# Patient Record
Sex: Female | Born: 1935 | Race: White | Hispanic: No | Smoking: Never smoker
Health system: Southern US, Community
[De-identification: ages and names within clinical notes are randomized; demographics above are authoritative.]

## PROBLEM LIST (undated history)

## (undated) DIAGNOSIS — I10 Essential (primary) hypertension: Secondary | ICD-10-CM

## (undated) DIAGNOSIS — I482 Chronic atrial fibrillation, unspecified: Secondary | ICD-10-CM

## (undated) DIAGNOSIS — D693 Immune thrombocytopenic purpura: Secondary | ICD-10-CM

## (undated) DIAGNOSIS — S329XXA Fracture of unspecified parts of lumbosacral spine and pelvis, initial encounter for closed fracture: Secondary | ICD-10-CM

## (undated) DIAGNOSIS — E079 Disorder of thyroid, unspecified: Secondary | ICD-10-CM

## (undated) DIAGNOSIS — E039 Hypothyroidism, unspecified: Secondary | ICD-10-CM

## (undated) DIAGNOSIS — R609 Edema, unspecified: Secondary | ICD-10-CM

## (undated) HISTORY — PX: BUNIONECTOMY: SHX129

## (undated) HISTORY — DX: Immune thrombocytopenic purpura: D69.3

## (undated) HISTORY — PX: DOPPLER ECHOCARDIOGRAPHY: SHX263

## (undated) HISTORY — DX: Edema, unspecified: R60.9

## (undated) HISTORY — DX: Essential (primary) hypertension: I10

## (undated) HISTORY — DX: Fracture of unspecified parts of lumbosacral spine and pelvis, initial encounter for closed fracture: S32.9XXA

## (undated) HISTORY — PX: ABDOMINAL HYSTERECTOMY: SHX81

## (undated) HISTORY — PX: OTHER SURGICAL HISTORY: SHX169

## (undated) HISTORY — DX: Disorder of thyroid, unspecified: E07.9

## (undated) HISTORY — DX: Chronic atrial fibrillation, unspecified: I48.20

---

## 1998-09-01 ENCOUNTER — Other Ambulatory Visit: Admission: RE | Admit: 1998-09-01 | Discharge: 1998-09-01 | Payer: Self-pay | Admitting: Gynecology

## 1999-10-17 ENCOUNTER — Other Ambulatory Visit: Admission: RE | Admit: 1999-10-17 | Discharge: 1999-10-17 | Payer: Self-pay | Admitting: Gynecology

## 1999-10-20 ENCOUNTER — Encounter: Admission: RE | Admit: 1999-10-20 | Discharge: 1999-10-20 | Payer: Self-pay | Admitting: Gynecology

## 1999-10-20 ENCOUNTER — Encounter: Payer: Self-pay | Admitting: Gynecology

## 1999-10-24 ENCOUNTER — Encounter: Payer: Self-pay | Admitting: Gynecology

## 1999-10-24 ENCOUNTER — Encounter: Admission: RE | Admit: 1999-10-24 | Discharge: 1999-10-24 | Payer: Self-pay | Admitting: Gynecology

## 2000-10-30 ENCOUNTER — Encounter: Admission: RE | Admit: 2000-10-30 | Discharge: 2000-10-30 | Payer: Self-pay | Admitting: Gynecology

## 2000-10-30 ENCOUNTER — Encounter: Payer: Self-pay | Admitting: Gynecology

## 2000-11-21 ENCOUNTER — Other Ambulatory Visit: Admission: RE | Admit: 2000-11-21 | Discharge: 2000-11-21 | Payer: Self-pay | Admitting: Gynecology

## 2001-07-17 ENCOUNTER — Ambulatory Visit (HOSPITAL_COMMUNITY): Admission: RE | Admit: 2001-07-17 | Discharge: 2001-07-17 | Payer: Self-pay | Admitting: Internal Medicine

## 2001-08-15 ENCOUNTER — Encounter (INDEPENDENT_AMBULATORY_CARE_PROVIDER_SITE_OTHER): Payer: Self-pay | Admitting: Specialist

## 2001-08-15 ENCOUNTER — Other Ambulatory Visit: Admission: RE | Admit: 2001-08-15 | Discharge: 2001-08-15 | Payer: Self-pay | Admitting: *Deleted

## 2001-10-12 ENCOUNTER — Emergency Department (HOSPITAL_COMMUNITY): Admission: EM | Admit: 2001-10-12 | Discharge: 2001-10-12 | Payer: Self-pay | Admitting: Emergency Medicine

## 2001-10-12 ENCOUNTER — Encounter: Payer: Self-pay | Admitting: Emergency Medicine

## 2001-12-08 ENCOUNTER — Emergency Department (HOSPITAL_COMMUNITY): Admission: EM | Admit: 2001-12-08 | Discharge: 2001-12-08 | Payer: Self-pay | Admitting: Emergency Medicine

## 2002-02-06 ENCOUNTER — Encounter: Admission: RE | Admit: 2002-02-06 | Discharge: 2002-02-06 | Payer: Self-pay | Admitting: Gynecology

## 2002-02-06 ENCOUNTER — Encounter: Payer: Self-pay | Admitting: Gynecology

## 2002-03-12 ENCOUNTER — Other Ambulatory Visit: Admission: RE | Admit: 2002-03-12 | Discharge: 2002-03-12 | Payer: Self-pay | Admitting: Gynecology

## 2003-02-05 ENCOUNTER — Encounter: Payer: Self-pay | Admitting: Gynecology

## 2003-02-05 ENCOUNTER — Encounter: Admission: RE | Admit: 2003-02-05 | Discharge: 2003-02-05 | Payer: Self-pay | Admitting: Gynecology

## 2004-02-28 ENCOUNTER — Encounter: Admission: RE | Admit: 2004-02-28 | Discharge: 2004-02-28 | Payer: Self-pay | Admitting: Gynecology

## 2004-03-31 ENCOUNTER — Other Ambulatory Visit: Admission: RE | Admit: 2004-03-31 | Discharge: 2004-03-31 | Payer: Self-pay | Admitting: Gynecology

## 2004-08-04 ENCOUNTER — Ambulatory Visit: Payer: Self-pay | Admitting: Oncology

## 2005-03-02 ENCOUNTER — Ambulatory Visit: Payer: Self-pay | Admitting: Oncology

## 2005-03-28 ENCOUNTER — Encounter: Admission: RE | Admit: 2005-03-28 | Discharge: 2005-03-28 | Payer: Self-pay | Admitting: Gynecology

## 2005-04-10 ENCOUNTER — Encounter: Admission: RE | Admit: 2005-04-10 | Discharge: 2005-04-10 | Payer: Self-pay | Admitting: Gynecology

## 2005-08-29 ENCOUNTER — Ambulatory Visit: Payer: Self-pay | Admitting: Oncology

## 2006-04-24 ENCOUNTER — Encounter: Admission: RE | Admit: 2006-04-24 | Discharge: 2006-04-24 | Payer: Self-pay | Admitting: Gynecology

## 2006-05-22 ENCOUNTER — Other Ambulatory Visit: Admission: RE | Admit: 2006-05-22 | Discharge: 2006-05-22 | Payer: Self-pay | Admitting: Gynecology

## 2006-05-27 ENCOUNTER — Ambulatory Visit: Payer: Self-pay | Admitting: Oncology

## 2006-07-02 LAB — CBC WITH DIFFERENTIAL/PLATELET
BASO%: 0.4 % (ref 0.0–2.0)
EOS%: 3.6 % (ref 0.0–7.0)
LYMPH%: 23.7 % (ref 14.0–48.0)
MCHC: 33.7 g/dL (ref 32.0–36.0)
MONO#: 0.5 10*3/uL (ref 0.1–0.9)
MONO%: 8.1 % (ref 0.0–13.0)
Platelets: 78 10*3/uL — ABNORMAL LOW (ref 145–400)
RBC: 4.36 10*6/uL (ref 3.70–5.32)
WBC: 5.8 10*3/uL (ref 3.9–10.0)

## 2006-09-13 ENCOUNTER — Ambulatory Visit: Payer: Self-pay | Admitting: Vascular Surgery

## 2006-09-13 ENCOUNTER — Ambulatory Visit: Payer: Self-pay | Admitting: Oncology

## 2006-09-13 LAB — CBC WITH DIFFERENTIAL/PLATELET
BASO%: 0.2 % (ref 0.0–2.0)
HCT: 37.5 % (ref 34.8–46.6)
MCHC: 34.9 g/dL (ref 32.0–36.0)
MONO#: 0.5 10*3/uL (ref 0.1–0.9)
RBC: 4.27 10*6/uL (ref 3.70–5.32)
RDW: 13.3 % (ref 11.3–14.5)
WBC: 5.9 10*3/uL (ref 3.9–10.0)
lymph#: 1.4 10*3/uL (ref 0.9–3.3)

## 2006-09-13 LAB — CHCC SMEAR

## 2006-09-26 ENCOUNTER — Ambulatory Visit: Payer: Self-pay | Admitting: Gastroenterology

## 2006-10-25 ENCOUNTER — Ambulatory Visit: Payer: Self-pay | Admitting: Gastroenterology

## 2006-10-25 LAB — CONVERTED CEMR LAB
Basophils Relative: 0.2 % (ref 0.0–1.0)
Eosinophils Relative: 3.1 % (ref 0.0–5.0)
HCT: 38.7 % (ref 36.0–46.0)
Hemoglobin: 13 g/dL (ref 12.0–15.0)
MCHC: 33.6 g/dL (ref 30.0–36.0)
MCV: 89.3 fL (ref 78.0–100.0)
Monocytes Absolute: 0.4 10*3/uL (ref 0.2–0.7)
Monocytes Relative: 7.7 % (ref 3.0–11.0)
Neutro Abs: 3.2 10*3/uL (ref 1.4–7.7)
WBC: 5.2 10*3/uL (ref 4.5–10.5)

## 2006-10-28 ENCOUNTER — Ambulatory Visit: Payer: Self-pay | Admitting: Gastroenterology

## 2007-03-27 ENCOUNTER — Ambulatory Visit: Payer: Self-pay | Admitting: Oncology

## 2007-05-02 LAB — CBC WITH DIFFERENTIAL/PLATELET
Basophils Absolute: 0 10*3/uL (ref 0.0–0.1)
EOS%: 2.7 % (ref 0.0–7.0)
Eosinophils Absolute: 0.2 10*3/uL (ref 0.0–0.5)
HGB: 12.8 g/dL (ref 11.6–15.9)
NEUT#: 3.9 10*3/uL (ref 1.5–6.5)
RBC: 4.09 10*6/uL (ref 3.70–5.32)
RDW: 12.8 % (ref 11.3–14.5)
WBC: 6.2 10*3/uL (ref 3.9–10.0)
lymph#: 1.7 10*3/uL (ref 0.9–3.3)

## 2007-05-14 ENCOUNTER — Encounter: Admission: RE | Admit: 2007-05-14 | Discharge: 2007-05-14 | Payer: Self-pay | Admitting: Gynecology

## 2007-05-29 ENCOUNTER — Encounter: Admission: RE | Admit: 2007-05-29 | Discharge: 2007-05-29 | Payer: Self-pay | Admitting: Gynecology

## 2008-01-25 ENCOUNTER — Ambulatory Visit: Payer: Self-pay | Admitting: Oncology

## 2008-03-05 LAB — CBC WITH DIFFERENTIAL/PLATELET
Eosinophils Absolute: 0.2 10*3/uL (ref 0.0–0.5)
LYMPH%: 23.9 % (ref 14.0–48.0)
MCHC: 33.9 g/dL (ref 32.0–36.0)
MCV: 89.9 fL (ref 81.0–101.0)
MONO%: 6.7 % (ref 0.0–13.0)
NEUT#: 4.4 10*3/uL (ref 1.5–6.5)
Platelets: 74 10*3/uL — ABNORMAL LOW (ref 145–400)
RBC: 4.23 10*6/uL (ref 3.70–5.32)

## 2008-06-25 ENCOUNTER — Encounter: Admission: RE | Admit: 2008-06-25 | Discharge: 2008-06-25 | Payer: Self-pay | Admitting: Gynecology

## 2008-07-01 ENCOUNTER — Encounter: Admission: RE | Admit: 2008-07-01 | Discharge: 2008-07-01 | Payer: Self-pay | Admitting: Gynecology

## 2008-12-01 ENCOUNTER — Ambulatory Visit: Payer: Self-pay | Admitting: Oncology

## 2009-01-14 ENCOUNTER — Ambulatory Visit: Payer: Self-pay | Admitting: Oncology

## 2009-01-18 LAB — CBC WITH DIFFERENTIAL/PLATELET
Basophils Absolute: 0 10*3/uL (ref 0.0–0.1)
Eosinophils Absolute: 0.2 10*3/uL (ref 0.0–0.5)
HCT: 36.2 % (ref 34.8–46.6)
HGB: 12.3 g/dL (ref 11.6–15.9)
LYMPH%: 24.6 % (ref 14.0–49.7)
MCV: 89.5 fL (ref 79.5–101.0)
MONO%: 7.2 % (ref 0.0–14.0)
NEUT#: 3.7 10*3/uL (ref 1.5–6.5)
Platelets: 69 10*3/uL — ABNORMAL LOW (ref 145–400)

## 2009-08-24 ENCOUNTER — Encounter: Admission: RE | Admit: 2009-08-24 | Discharge: 2009-08-24 | Payer: Self-pay | Admitting: Gynecology

## 2009-10-17 ENCOUNTER — Ambulatory Visit: Payer: Self-pay | Admitting: Oncology

## 2009-10-18 LAB — CBC WITH DIFFERENTIAL/PLATELET
MCV: 89.8 fL (ref 79.5–101.0)
NEUT#: 4 10*3/uL (ref 1.5–6.5)
RBC: 4.26 10*6/uL (ref 3.70–5.45)

## 2010-07-02 ENCOUNTER — Encounter: Payer: Self-pay | Admitting: Gynecology

## 2010-10-27 NOTE — Assessment & Plan Note (Signed)
[facility]                         GASTROENTEROLOGY OFFICE NOTE   Hayley [name], Hayley [name]                    MRN:          [id number]  DATE:09/26/2006                            DOB:          June 24, 1920    REASON FOR REFERAL:  Hematochezia.   HISTORY OF PRESENT ILLNESS:  Hayley [name] is a very nice 77 year old  white female referred through the courtesy of Dr. Timothy Lasso. She has a  history of ITP and she is followed by Dr. Truett Perna. She states her  platelet count is normally in the range of 70,000. She has noted  intermittent problems with small volume bright red blood per rectum on  and off for several years. She has no other gastrointestinal complaints  and specifically denies any weight loss, change in stool caliber,  nausea, vomiting, dysphagia, or odynophagia. There is no family history  of colon cancer, colon polyps, or inflammatory bowel disease. Blood work  from September 11, 2006 shows a normal metabolic panel, normal CBC, and  normal TSH, except a low platelet count at 42,000. The patient states  that the platelet count was repeated and was 70,000.   PAST MEDICAL HISTORY:  Hypertension, hypothyroidism, ITP, lower  extremity edema, hyperlipidemia, osteoarthritis, osteoporosis, status  post hysterectomy.   CURRENT MEDICATIONS:  Listed on the chart, updated and reviewed.   MEDICATION ALLERGIES:  SULFA DRUGS.   Social history and review of systems per the handwritten form.   PHYSICAL EXAMINATION:  Well-developed, well-nourished, elderly white  female, no acute distress. Height 5 feet 2 inches, weight 164 pounds,  blood pressure is 150/88, pulse 60 and regular.  HEENT EXAM: Anicteric sclera. Oropharynx clear.  NECK: Without thyromegaly or adenopathy appreciated.  CHEST: Clear to auscultation bilaterally.  CARDIAC: Regular rate and rhythm without murmurs.  ABDOMEN: Soft, nontender, nondistended. Normoactive bowel sounds. No  palpable  organomegaly, masses, or hernias.  RECTAL EXAMINATION: Deferred to time of colonoscopy.  EXTREMITIES: Without clubbing, cyanosis, or edema.  NEUROLOGIC: Alert and oriented x3. Grossly nonfocal.   ASSESSMENT/PLAN:  Small volume hematochezia with a history of ITP. She  will obtain a CBC with platelets 2 days prior to the colonoscopy to  ensure that her platelet count is greater than 50,000 prior to the  procedure. Risk, benefits, and alternatives to colonoscopy, possible  biopsy and possible polypectomy discussed with the patient and she  consents to proceed. This will be scheduled electively.     Venita Lick. Russella Dar, MD, [facility]  Electronically Signed    MTS/MedQ  DD: 10/09/2006  DT: 10/09/2006  Job #: [id number]   cc:   Gwen Pounds, MD

## 2010-12-08 ENCOUNTER — Emergency Department (HOSPITAL_COMMUNITY): Payer: Medicare Other

## 2010-12-08 ENCOUNTER — Inpatient Hospital Stay (HOSPITAL_COMMUNITY)
Admission: EM | Admit: 2010-12-08 | Discharge: 2010-12-11 | DRG: 309 | Disposition: A | Discharge status: Home / Self Care | Payer: Medicare Other | Attending: Cardiology | Admitting: Cardiology

## 2010-12-08 DIAGNOSIS — M545 Low back pain, unspecified: Secondary | ICD-10-CM | POA: Diagnosis present

## 2010-12-08 DIAGNOSIS — E119 Type 2 diabetes mellitus without complications: Secondary | ICD-10-CM | POA: Diagnosis present

## 2010-12-08 DIAGNOSIS — S0100XA Unspecified open wound of scalp, initial encounter: Secondary | ICD-10-CM | POA: Diagnosis present

## 2010-12-08 DIAGNOSIS — Y998 Other external cause status: Secondary | ICD-10-CM

## 2010-12-08 DIAGNOSIS — R296 Repeated falls: Secondary | ICD-10-CM | POA: Diagnosis present

## 2010-12-08 DIAGNOSIS — E039 Hypothyroidism, unspecified: Secondary | ICD-10-CM | POA: Diagnosis present

## 2010-12-08 DIAGNOSIS — E871 Hypo-osmolality and hyponatremia: Secondary | ICD-10-CM | POA: Diagnosis present

## 2010-12-08 DIAGNOSIS — M25559 Pain in unspecified hip: Secondary | ICD-10-CM | POA: Diagnosis present

## 2010-12-08 DIAGNOSIS — D693 Immune thrombocytopenic purpura: Secondary | ICD-10-CM | POA: Diagnosis present

## 2010-12-08 DIAGNOSIS — I4891 Unspecified atrial fibrillation: Principal | ICD-10-CM | POA: Diagnosis present

## 2010-12-08 DIAGNOSIS — R55 Syncope and collapse: Secondary | ICD-10-CM | POA: Diagnosis present

## 2010-12-08 LAB — CBC
HCT: 35 % — ABNORMAL LOW (ref 36.0–46.0)
Hemoglobin: 12.2 g/dL (ref 12.0–15.0)
MCV: 87.7 fL (ref 78.0–100.0)
Platelets: 186 10*3/uL (ref 150–400)
RBC: 3.99 MIL/uL (ref 3.87–5.11)

## 2010-12-08 LAB — BASIC METABOLIC PANEL
BUN: 11 mg/dL (ref 6–23)
CO2: 24 mEq/L (ref 19–32)
Chloride: 95 mEq/L — ABNORMAL LOW (ref 96–112)
GFR calc Af Amer: >60 mL/min (ref >60)
Glucose, Bld: 187 mg/dL — ABNORMAL HIGH (ref 70–99)
Potassium: 3.6 mEq/L (ref 3.5–5.1)
Sodium: 129 mEq/L — ABNORMAL LOW (ref 135–145)

## 2010-12-08 LAB — URINALYSIS, ROUTINE W REFLEX MICROSCOPIC
Bilirubin Urine: NEGATIVE
Glucose, UA: NEGATIVE mg/dL
Ketones, ur: NEGATIVE mg/dL
Leukocytes, UA: NEGATIVE
Specific Gravity, Urine: 1.011 (ref 1.005–1.030)
pH: 6 (ref 5.0–8.0)

## 2010-12-08 LAB — TROPONIN I
Troponin I: <0.3 ng/mL (ref <0.30)
Troponin I: <0.3 ng/mL (ref <0.30)

## 2010-12-08 LAB — HEPATIC FUNCTION PANEL
Alkaline Phosphatase: 116 U/L (ref 39–117)
Indirect Bilirubin: 0.2 mg/dL — ABNORMAL LOW (ref 0.3–0.9)

## 2010-12-08 LAB — MAGNESIUM: Magnesium: 1.8 mg/dL (ref 1.5–2.5)

## 2010-12-08 LAB — PROTIME-INR: Prothrombin Time: 13.3 seconds (ref 11.6–15.2)

## 2010-12-08 LAB — CK TOTAL AND CKMB (NOT AT ARMC)
CK, MB: 3.5 ng/mL (ref 0.3–4.0)
CK, MB: 3.3 ng/mL (ref 0.3–4.0)

## 2010-12-09 LAB — CBC
HCT: 34.5 % — ABNORMAL LOW (ref 36.0–46.0)
Hemoglobin: 11.9 g/dL — ABNORMAL LOW (ref 12.0–15.0)
Platelets: 169 10*3/uL (ref 150–400)
RBC: 3.91 MIL/uL (ref 3.87–5.11)
WBC: 8.2 10*3/uL (ref 4.0–10.5)

## 2010-12-09 LAB — BASIC METABOLIC PANEL
CO2: 27 mEq/L (ref 19–32)
Chloride: 99 mEq/L (ref 96–112)
Creatinine, Ser: <0.47 mg/dL — ABNORMAL LOW (ref 0.50–1.10)
Glucose, Bld: 126 mg/dL — ABNORMAL HIGH (ref 70–99)

## 2010-12-09 LAB — HEMOGLOBIN A1C: Hgb A1c MFr Bld: 6.8 % — ABNORMAL HIGH (ref <5.7)

## 2010-12-09 LAB — CARDIAC PANEL(CRET KIN+CKTOT+MB+TROPI)
CK, MB: 2.7 ng/mL (ref 0.3–4.0)
Total CK: 67 U/L (ref 7–177)
Troponin I: <0.3 ng/mL (ref <0.30)

## 2010-12-10 ENCOUNTER — Inpatient Hospital Stay (HOSPITAL_COMMUNITY): Payer: Medicare Other

## 2010-12-10 HISTORY — PX: DOPPLER ECHOCARDIOGRAPHY: SHX263

## 2010-12-10 LAB — CBC
HCT: 34.8 % — ABNORMAL LOW (ref 36.0–46.0)
MCV: 89 fL (ref 78.0–100.0)
RBC: 3.91 MIL/uL (ref 3.87–5.11)
RDW: 13.7 % (ref 11.5–15.5)
WBC: 9.7 10*3/uL (ref 4.0–10.5)

## 2010-12-11 LAB — BASIC METABOLIC PANEL
CO2: 31 mEq/L (ref 19–32)
Glucose, Bld: 109 mg/dL — ABNORMAL HIGH (ref 70–99)
Potassium: 4.1 mEq/L (ref 3.5–5.1)
Sodium: 137 mEq/L (ref 135–145)

## 2010-12-14 NOTE — Discharge Summary (Signed)
NAMEALBANA, SAPERSTEIN NO.:  [id number]  MEDICAL RECORD NO.:  [id number]  LOCATION:  3736                         FACILITY:  [facility]  PHYSICIAN:  Landry Corporal, MD DATE OF BIRTH:  1921/05/07  DATE OF ADMISSION:  12/08/2010 DATE OF DISCHARGE:  12/11/2010                              DISCHARGE SUMMARY   DISCHARGE DIAGNOSES: 1. Syncope secondary to dehydration and atrial fibrillation with rapid     ventricular response. 2. Atrial fibrillation with rapid ventricular response, now rate     controlled.  The patient unaware that she was in atrial fibrillation. 3. Hyponatremia on admission, resolved. 4. Anticoagulation, being discharged on Coumadin. 5. Idiopathic thrombocytopenia, currently stable. 6. Borderline diabetes mellitus, to follow up with Dr. Timothy Lasso. 7. Hypertension, improved to control. 8. Chronic lower extremity edema, improved. 9. Occasional right hip pain, increasing in episodes.  No fractures by     x-ray. 10.Lower back pain after fall several weeks ago, to follow up with Dr.     Farris Has for both back and hip pain. 11.Scalp laceration, healing, more of an abrasion. 12.Hypothyroidism, stable.  DISCHARGE MEDICATIONS:  See medication reconciliation sheet, though medicines have been added including: 1. Diltiazem 180 daily. 2. Cozaar instead of Hyzaar. 3. Metoprolol tartrate 25 b.i.d. 4. Coumadin.  Please note, the simvastatin was changed to Lipitor secondary to interactions with calcium channel blockers with simvastatin.  DISCHARGE INSTRUCTIONS: 1. Increase activity slowly. 2. Low-sodium, heart-healthy, no-sugar to low-sugar diet.  No white     bread, use whole wheat instead with sweet potatoes instead of white     potatoes. 3. Scalp wound, should heal without problems.  If bleeding occurs,     call the office. 4. Follow up with Dr. Herbie Baltimore, December 28, 2010 at 1:45 p.m. 5. Follow up with Dr. Farris Has for hip and back discomfort.  Our office  should call you that appointment. 6. Follow up with Dr. Timothy Lasso as instructed.  Dr. Timothy Lasso did see her     today on a social visit. 7. Follow up with [facility] on     Thursday, December 14, 2010 at 3:40 p.m. 8. The patient is to go by our office today on way home to pick up a     monitor to wear. 9. The patient is scheduled for a 2-D echo of her heart on December 15, 2010, at 9:00 a.m.  HISTORY OF PRESENT ILLNESS:  An 77 year old white female who continues to work at [employer], presented to the emergency room by EMS on December 08, 2010.  She had come out of work at [employer] and briefly had been outside, was waiting for her daughter to pick her when a bystander stated she just collapsed, was unresponsive for seconds.  The patient had no recollection of falling.  She just woke up on the ground. She was not lightheaded or dizzy.  She stated she turned and she fell. The patient understood what had happened when she arrived, had no loss of bladder or bowel control.  She did have bleeding from her scalp laceration and abrasion of her scalp.  She was found to be in AFib  with rapid ventricular response, heart rate was 140-150.  The patient has no awareness of abnormal heart beats.  Prior to the episode, she denied any lightheadedness, dyspnea, shortness of breath, chest pain or palpitations and she has none on the exam in the emergency room.  PAST MEDICAL HISTORY: 1. Hypertension. 2. Hypothyroidism. 3. ITP, previously platelets had been running 70,000, currently stable     160. 4. Hyperlipidemia. 5. Chronic lower extremity edema. 6. Osteoporosis. 7. Status post hysterectomy. 8. Lumbar back pain after fall 3 weeks ago, followed by Dr. Farris Has,     orthopedist, Sports Medicine 9. No history of diabetes, but then she stated she was borderline     diabetic.  ALLERGIES:  To SULFA.  The patient was found to be hyponatremic on admission with a sodium  of 129.  The HCTZ portion was discontinued out of her Hyzaar.  We opted not to use heparin the night of admission secondary to scalp bleeding, history of thrombocytopenia.  We used SCDs stockings to prevent VTEs. Additionally, we added low-dose beta-blocker and she was on IV Cardizem drip.  By the next morning, the rate was slower.  We actually decreased the Cardizem to 5 mg from 10 and we switched the p.o. Cardizem the morning of December 09, 2010.  She remained stable.  At that point, Dr. Herbie Baltimore felt she was stable to add subcu Lovenox and begin Coumadin, which we did.  By July 1, the patient had also complained of constipation, she was given stool softener as well as MiraLax with no results. By  December 11, 2010, Dulcolax suppository was added.  Her hemoglobin A1c came back at 6.8.  Dr. Timothy Lasso was made aware that she was in the hospital just for his information and he will follow her borderline diabetes up in the office.  At discharge, blood pressure was 150/89, respiratory rate is 18, temperature 98.4, her pulse after the Cardizem and beta blocker was somewhat lower in the 80s, SPO2 was 98%. Her sodium is 137, potassium 4.1, BUN 9, creatinine 0.49.  LFTs on admission were normal and direct bili was little low at 0.2, albumin 3.3.  Magnesium level was 1.8.  At discharge, hemoglobin 11.5, hematocrit 34.8, WBC 9.7, platelets 160. Hemoglobin A1c was 6.8 as stated.  Cardiac enzymes were all negative. CKs ranged 85-67 with MBs 3.5-2.7, and TSH 3.926.  UA was clear.  MRSA screen was negative.  RADIOLOGY:  Chest x-ray on admission, no acute cardiopulmonary findings. Mild cardiac enlargement.  CT of the head, no acute intracranial abnormality.  Chronic small vessel white matter ischemic demyelination.  Elbow x-rays, no acute osseous abnormality.  Degenerative changes of the right elbow, the right hip due to hip pain, generalized osteopenia, old healed fractures of the right superior and  inferior pubic rami.  No evidence of acute fracture or dislocation.  She may have a left inguinal femoral hernia and there are acute events on soft tissue swelling superior to the right femoral greater trochanter.  EKGs revealed AFib, initially rapid ventricular response and no acute EKG changes.  Followup with rate control in the 90s.  The patient ambulated without complications and will follow up as instructed.  No brady arrhythmias were noted during the hospitalization.     Darcella Gasman. Annie Paras, N.P.   ______________________________ Landry Corporal, MD    LRI/MEDQ  D:  12/11/2010  T:  12/12/2010  Job:  [id number]  cc:   Dr. Julieanne Manson, MD Ladene Artist, M.D.  Electronically Signed by Nada Boozer N.P. on 12/14/2010 02:59:39 PM Electronically Signed by Bryan Lemma MD on 12/14/2010 11:13:16 PM

## 2011-04-08 ENCOUNTER — Encounter: Payer: Self-pay | Admitting: *Deleted

## 2011-04-16 ENCOUNTER — Other Ambulatory Visit (HOSPITAL_BASED_OUTPATIENT_CLINIC_OR_DEPARTMENT_OTHER): Payer: Medicare Other | Admitting: Lab

## 2011-04-16 ENCOUNTER — Other Ambulatory Visit: Payer: Self-pay | Admitting: *Deleted

## 2011-04-16 ENCOUNTER — Ambulatory Visit (HOSPITAL_BASED_OUTPATIENT_CLINIC_OR_DEPARTMENT_OTHER): Payer: Medicare Other | Admitting: Oncology

## 2011-04-16 ENCOUNTER — Other Ambulatory Visit: Payer: Self-pay | Admitting: Oncology

## 2011-04-16 VITALS — BP 99/60 | HR 56 | Temp 96.9°F | Ht 62.0 in | Wt 153.2 lb

## 2011-04-16 DIAGNOSIS — D693 Immune thrombocytopenic purpura: Secondary | ICD-10-CM | POA: Insufficient documentation

## 2011-04-16 DIAGNOSIS — D696 Thrombocytopenia, unspecified: Secondary | ICD-10-CM

## 2011-04-16 DIAGNOSIS — I4891 Unspecified atrial fibrillation: Secondary | ICD-10-CM

## 2011-04-16 DIAGNOSIS — Z7901 Long term (current) use of anticoagulants: Secondary | ICD-10-CM

## 2011-04-16 LAB — CBC WITH DIFFERENTIAL/PLATELET
Basophils Absolute: 0.1 10*3/uL (ref 0.0–0.1)
Eosinophils Absolute: 0.1 10*3/uL (ref 0.0–0.5)
HGB: 11.9 g/dL (ref 11.6–15.9)
MCV: 92.3 fL (ref 79.5–101.0)
MONO#: 0.6 10*3/uL (ref 0.1–0.9)
MONO%: 7.8 % (ref 0.0–14.0)
NEUT#: 5.4 10*3/uL (ref 1.5–6.5)
Platelets: 133 10*3/uL — ABNORMAL LOW (ref 145–400)
RDW: 13.2 % (ref 11.2–14.5)
WBC: 8.1 10*3/uL (ref 3.9–10.3)

## 2011-04-16 LAB — PROTIME-INR
INR: 2.5 (ref 2.00–3.50)
Protime: 30 Seconds — ABNORMAL HIGH (ref 10.6–13.4)

## 2011-04-16 NOTE — Progress Notes (Signed)
OFFICE PROGRESS NOTE  Interval history:   Hayley [name] is an 77 year old woman with chronic thrombocytopenia likely related to chronic ITP versus a low-grade lymphoproliferative disorder. She returns today after missing an appointment in March of this year.  She was hospitalized in late June of this year after a fall. She was found to be in rapid atrial fibrillation. She was started on Coumadin anticoagulation.  Hayley [name] reports that overall she feels well. She denies fevers or sweats. No anorexia or weight loss. No shortness of breath or cough. She reports recently being diagnosed with a urinary tract infection. She was started on a course of ciprofloxacin.  She denies bleeding. She does report intermittent black stools.  Her current Coumadin dose is 2.5 mg 2 days per week and 5 mg all other days.    Objective: Vital signs in last 24 hours: Blood pressure 99/60, pulse 56, temperature 96.9 F (36.1 C), temperature source Oral, height 5\' 2"  (1.575 m), weight 153 lb 3.2 oz (69.491 kg). Small ecchymosis at the left upper posterior gumline. No palpable cervical, supraclavicular or axillary lymph nodes. Lungs are clear. No wheezes or rales. Regular cardiac rhythm. Abdomen is soft and nontender. 1-2+ pitting edema at the lower legs bilaterally.    Lab Results:  CBC hemoglobin 11.9 white count 8.1 absolute neutrophil count 5.4 platelet count 133,000.    Studies/Results: No results found.  Medications: I have reviewed the patient's current medications.  Assessment/Plan:  1. Thrombocytopenia-the platelet count is better. 2. Anticoagulation-Hayley [name] is now maintained on Coumadin anticoagulation for atrial fibrillation. The Coumadin is followed through her cardiologist's office. 3. Atrial fibrillation. 4. Disposition-Hayley [name] will return for a followup visit in one year. We are checking a PT/INR in the office today as she was recently started on a course of Cipro for  a urinary tract infection. We will forward the result to her cardiologist. She will contact our office prior to her next visit with any problems.  Plan reviewed with Dr. Truett Perna.     Hayley [name] ANP/GNP-C

## 2011-08-23 ENCOUNTER — Emergency Department (HOSPITAL_COMMUNITY): Payer: Medicare Other

## 2011-08-23 ENCOUNTER — Emergency Department (HOSPITAL_COMMUNITY)
Admission: EM | Admit: 2011-08-23 | Discharge: 2011-08-23 | Disposition: A | Discharge status: Home / Self Care | Payer: Medicare Other | Attending: Emergency Medicine | Admitting: Emergency Medicine

## 2011-08-23 ENCOUNTER — Encounter (HOSPITAL_COMMUNITY): Payer: Self-pay | Admitting: Emergency Medicine

## 2011-08-23 DIAGNOSIS — I4891 Unspecified atrial fibrillation: Secondary | ICD-10-CM | POA: Insufficient documentation

## 2011-08-23 DIAGNOSIS — I10 Essential (primary) hypertension: Secondary | ICD-10-CM | POA: Insufficient documentation

## 2011-08-23 DIAGNOSIS — E039 Hypothyroidism, unspecified: Secondary | ICD-10-CM | POA: Insufficient documentation

## 2011-08-23 DIAGNOSIS — M25519 Pain in unspecified shoulder: Secondary | ICD-10-CM | POA: Insufficient documentation

## 2011-08-23 DIAGNOSIS — Z7901 Long term (current) use of anticoagulants: Secondary | ICD-10-CM | POA: Insufficient documentation

## 2011-08-23 DIAGNOSIS — W010XXA Fall on same level from slipping, tripping and stumbling without subsequent striking against object, initial encounter: Secondary | ICD-10-CM | POA: Insufficient documentation

## 2011-08-23 DIAGNOSIS — S42033A Displaced fracture of lateral end of unspecified clavicle, initial encounter for closed fracture: Secondary | ICD-10-CM | POA: Insufficient documentation

## 2011-08-23 LAB — PROTIME-INR
INR: 1.9 — ABNORMAL HIGH (ref 0.00–1.49)
Prothrombin Time: 22.1 seconds — ABNORMAL HIGH (ref 11.6–15.2)

## 2011-08-23 MED ORDER — TRAMADOL HCL 50 MG PO TABS: 50.0000 mg | ORAL_TABLET | Freq: Three times a day (TID) | ORAL | Status: AC | PRN

## 2011-08-23 MED ORDER — OXYCODONE-ACETAMINOPHEN 5-325 MG PO TABS
1.0000 | ORAL_TABLET | Freq: Once | ORAL | Status: AC
  Administered 2011-08-23: 1 via ORAL
  Filled 2011-08-23: qty 1

## 2011-08-23 NOTE — ED Notes (Signed)
ZOX:[id number]<AV> Expected date:08/23/11<BR> Expected time:<BR> Means of arrival:<BR> Comments:<BR> EMS 20 GC - fall

## 2011-08-23 NOTE — ED Provider Notes (Signed)
History     CSN: [id number]  Arrival date & time 08/23/11  1210   First MD Initiated Contact with Patient 08/23/11 1214      Chief Complaint  Patient presents with  . Fall    (Consider location/radiation/quality/duration/timing/severity/associated sxs/prior treatment) HPI  H/o afib on coumadin pw fall. Pt states she was at work and tripped forward, landing on her RUE. She is adamant that she did not hit her head. States that she has 3/10 R shoulder pain, worse with movement. Denies numbness/tingling/weakness of extremities.   ED Notes, ED Provider Notes from 08/23/11 0000 to 08/23/11 78:29:56       Rudy Jew, RN 08/23/2011 12:27      Per EMS, fall @ work-did not hit head, c/o right should pain, used right extremity to brace fall         Modena Jansky 08/23/2011 12:14      OZH:[id number]  Expected date:08/23/11  Expected time:  Means of arrival:  Comments:  EMS 20 GC - fall     Past Medical History  Diagnosis Date  . Chronic ITP (idiopathic thrombocytopenia)   . Atrial fibrillation with rapid ventricular response 11/2010  . Hypertension   . Chronic edema     BLE  . Thyroid disease     Hypothyroid    History reviewed. No pertinent past surgical history.  No family history on file.  History  Substance Use Topics  . Smoking status: Not on file  . Smokeless tobacco: Not on file  . Alcohol Use: Not on file    OB History    Grav Para Term Preterm Abortions TAB SAB Ect Mult Living                  Review of Systems  All other systems reviewed and are negative.   except as noted HPI   Allergies  Sulfa antibiotics  Home Medications   Current Outpatient Rx  Name Route Sig Dispense Refill  . CALCIUM GLUCONATE 500 MG PO CAPS Oral Take 2 capsules by mouth daily.      Marland Kitchen DILTIAZEM HCL ER COATED BEADS 360 MG PO CP24 Oral Take 360 mg by mouth daily.     . FUROSEMIDE 20 MG PO TABS Oral Take 20 mg by mouth daily as needed. swelling    . LEVOTHYROXINE SODIUM 100 MCG PO  TABS Oral Take 100 mcg by mouth daily.      Marland Kitchen LOSARTAN POTASSIUM 50 MG PO TABS Oral Take 25 mg by mouth daily.      . MULTIVITAMIN PO Oral Take 1 tablet by mouth daily.      Marland Kitchen ECHINACEA ACZ PO Oral Take by mouth daily.      Marland Kitchen K-TABS PO Oral Take 0.5 tablets by mouth daily. Does not know dosage     . SERTRALINE HCL 100 MG PO TABS Oral Take 100 mg by mouth daily.      . ALENDRONATE SODIUM 70 MG PO TABS Oral Take 70 mg by mouth every 30 (thirty) days. Monthly    . TRAMADOL HCL 50 MG PO TABS Oral Take 1 tablet (50 mg total) by mouth every 8 (eight) hours as needed for pain. 15 tablet 0    BP 127/78  Pulse 67  Temp(Src) 97.8 F (36.6 C) (Oral)  Resp 15  SpO2 96%  Physical Exam  Nursing note and vitals reviewed. Constitutional: She is oriented to person, place, and time. She appears well-developed.  HENT:  Head: Atraumatic.  Mouth/Throat: Oropharynx is clear and moist.  Eyes: Conjunctivae and EOM are normal. Pupils are equal, round, and reactive to light.  Neck: Normal range of motion. Neck supple.  Cardiovascular: Normal rate, regular rhythm, normal heart sounds and intact distal pulses.   Pulmonary/Chest: Effort normal and breath sounds normal. No respiratory distress. She has no wheezes. She has no rales.  Abdominal: Soft. She exhibits no distension. There is no tenderness. There is no rebound and no guarding.  Musculoskeletal: Normal range of motion.       Rt shoulder dec ROM 2/2 pain. Min diffuse ttp > distal clavicle and AC joint. Radial pulse intact. No bony ttp elbow/wrist. Grip strength 5/5. Capillary refill < 3sec. Gross sensation intact  Neurological: She is alert and oriented to person, place, and time.  Skin: Skin is warm and dry. No rash noted.  Psychiatric: She has a normal mood and affect.    ED Course  Procedures (including critical care time)  Labs Reviewed  PROTIME-INR - Abnormal; Notable for the following:    Prothrombin Time 22.1 (*)    INR 1.90 (*)    All  other components within normal limits   Dg Shoulder Right  08/23/2011  *RADIOLOGY REPORT*  Clinical Data: Fall, shoulder pain.  RIGHT SHOULDER - 2+ VIEW  Comparison: None.  Findings: There is a fracture through the distal right clavicle. Distal fragment displaced inferiorly greater than one shaft width. AC joint and glenohumeral joint appear intact.  IMPRESSION: Displaced distal right clavicular fracture.  Original Report Authenticated By: Cyndie Chime, M.D.     1. Displaced fracture of lateral end of clavicle       MDM  S/p mechanical fall with displaced clavicle fracture. Pain controlled. No skin tenting. Neurovasc intact. Will give referral to orthopedic surgery. Sling immobilization. Pain control, home. Will f/u with PMD for recheck of her INR.        Forbes Cellar, MD 08/23/11 1450

## 2011-08-23 NOTE — Discharge Instructions (Signed)
Clavicle Fracture You have a broken clavicle or collar bone. This bone is fractured more often than any other large bone in the body. Clavicle fractures are usually caused by falling on your shoulder. Fractures that are widely separated often heal with a slight deformity or bump. Sometimes there is also damage to the nerves and blood vessels near the collar bone. Treatment of clavicle fractures includes:  A sling and/or figure-of-eight strap to help rest the injured shoulder. Immobilization of the injury for 3-4 weeks in children and 4-6 weeks in adults is usually recommended.   Apply ice packs to the site of the fracture for 20-30 minutes every 3-4 hours for 2-3 days.   Pain medicine may be needed for several days.   Surgery is rarely needed to reduce the deformity or stabilize the bone. Surgical repair is most often done when:   The fracture fragments are separated by nearly an inch (2.5cm).   There is shortening of the clavicle through the fracture site of an inch (2.5cm).  Follow-up examination and x-rays are usually needed to check on the healing bone. Uncomplicated clavicle fractures will usually heal well in 6-8 weeks. You should be rechecked within 7-10 days by your caregiver for repeat x-rays.  SEEK IMMEDIATE MEDICAL CARE IF:   You develop numbness, weakness or progressive swelling in the affected arm.   Your pain is getting worse or you are not improving.   You have any other questions or concerns.  Document Released: 07/05/2004 Document Revised: 05/17/2011 Document Reviewed: 12/07/2008 [employer] Patient Information 2012 [employer]. 

## 2011-08-23 NOTE — ED Notes (Signed)
Per EMS, fall @ work-did not hit head, c/o right should pain, used right extremity to brace fall

## 2011-08-31 ENCOUNTER — Emergency Department (HOSPITAL_COMMUNITY)
Admission: EM | Admit: 2011-08-31 | Discharge: 2011-08-31 | Disposition: A | Discharge status: Home / Self Care | Payer: Medicare Other | Attending: Emergency Medicine | Admitting: Emergency Medicine

## 2011-08-31 ENCOUNTER — Emergency Department (HOSPITAL_COMMUNITY): Payer: Medicare Other

## 2011-08-31 ENCOUNTER — Other Ambulatory Visit: Payer: Self-pay

## 2011-08-31 DIAGNOSIS — E039 Hypothyroidism, unspecified: Secondary | ICD-10-CM | POA: Insufficient documentation

## 2011-08-31 DIAGNOSIS — I10 Essential (primary) hypertension: Secondary | ICD-10-CM | POA: Insufficient documentation

## 2011-08-31 DIAGNOSIS — IMO0002 Reserved for concepts with insufficient information to code with codable children: Secondary | ICD-10-CM | POA: Insufficient documentation

## 2011-08-31 DIAGNOSIS — W07XXXA Fall from chair, initial encounter: Secondary | ICD-10-CM | POA: Insufficient documentation

## 2011-08-31 DIAGNOSIS — R29898 Other symptoms and signs involving the musculoskeletal system: Secondary | ICD-10-CM | POA: Insufficient documentation

## 2011-08-31 DIAGNOSIS — M25519 Pain in unspecified shoulder: Secondary | ICD-10-CM | POA: Insufficient documentation

## 2011-08-31 DIAGNOSIS — Z79899 Other long term (current) drug therapy: Secondary | ICD-10-CM | POA: Insufficient documentation

## 2011-08-31 DIAGNOSIS — S43409A Unspecified sprain of unspecified shoulder joint, initial encounter: Secondary | ICD-10-CM

## 2011-08-31 DIAGNOSIS — I4891 Unspecified atrial fibrillation: Secondary | ICD-10-CM | POA: Insufficient documentation

## 2011-08-31 LAB — BASIC METABOLIC PANEL
BUN: 15 mg/dL (ref 6–23)
CO2: 29 mEq/L (ref 19–32)
Chloride: 101 mEq/L (ref 96–112)
Creatinine, Ser: 0.53 mg/dL (ref 0.50–1.10)
Glucose, Bld: 174 mg/dL — ABNORMAL HIGH (ref 70–99)

## 2011-08-31 LAB — CBC
HCT: 38.3 % (ref 36.0–46.0)
Hemoglobin: 12.7 g/dL (ref 12.0–15.0)
MCV: 88.2 fL (ref 78.0–100.0)
RBC: 4.34 MIL/uL (ref 3.87–5.11)
WBC: 7.8 10*3/uL (ref 4.0–10.5)

## 2011-08-31 MED ORDER — HYDROCODONE-ACETAMINOPHEN 5-325 MG PO TABS
1.0000 | ORAL_TABLET | Freq: Once | ORAL | Status: DC
  Filled 2011-08-31: qty 1

## 2011-08-31 NOTE — ED Notes (Signed)
Left arm pain (upper) w/ROM x .  Was here last week for RT clavicle fx d/t fall.  Today she fell again after trying to sit down in chair and chair slid out falling onto LT side.   No LOC or hitting of head.  Lives home alone.

## 2011-08-31 NOTE — Discharge Instructions (Signed)
Take your pain medication as need. Icepack/cold to sore area.  Your INR/Coumadin level today is 3.3 - call your doctor with this result today to discuss whether they want to adjust your dose. Also, given the recent falls/risk of fall, discuss with your doctor whether they want to continue you on the coumadin.  Given recent clavicle injury and left shoulder strain/sprain - follow up with orthopedist in coming week.  Return to ER right away if worse, new symptoms, headache, severe pain, vomiting, change in mental status, fevers, other concern.     Home Safety and Preventing Falls Falls are a leading cause of injury and while they affect all age groups, falls have greater short-term and long-term impact on older age groups. However, falls should not be a part of life or aging. It is possible for individuals and their families to use preventive measures to significantly decrease the likelihood that anyone, especially an older adult, will fall. There are many simple measures which can make your home safer with respect to preventing falls. The following actions can help reduce falls among all members of your family and are especially important as you age, when your balance, lower limb strength, coordination, and eyesight may be declining. The use of preventive measures will help to reduce you and your family's risk of falls and serious medical consequences. OUTDOORS  Repair cracks and edges of walkways and driveways.   Remove high doorway thresholds and trim shrubbery on the main path into your home.   Ensure there is good outside lighting at main entrances and along main walkways.   Clear walkways of tools, rocks, debris, and clutter.   Check that handrails are not broken and are securely fastened. Both sides of steps should have handrails.   In the garage, be attentive to and clean up grease or oil spills on the cement. This can make the surface extremely slippery.   In winter, have leaves, snow,  and ice cleared regularly.   Use sand or salt on walkways during winter months.  BATHROOM  Install grab bars by the toilet and in the tub and shower.   Use non-skid mats or decals in the tub or shower.   If unable to easily stand unsupported while showering, place a plastic non slip stool in the shower to sit on when needed.   Install night lights.   Keep floors dry and clean up all water on the floor immediately.   Remove soap buildup in tub or shower on a regular basis.   Secure bath mats with non-slip, double-sided rug tape.   Remove tripping hazards from the floors.  BEDROOMS  Install night lights.   Do not use oversized bedding.   Make sure a bedside light is easy to reach.   Keep a telephone by your bedside.   Make sure that you can get in and out of your bed easily.   Have a firm chair, with side arms, to use for getting dressed.   Remove clutter from around closets.   Store clothing, bed coverings, and other household items where you can reach them comfortably.   Remove tripping hazards from the floor.  LIVING AREAS AND STAIRWAYS  Turn on lights to avoid having to walk through dark areas.   Keep lighting uniform in each room. Place brighter lightbulbs in darker areas, including stairways.   Replace lightbulbs that burn out in stairways immediately.   Arrange furniture to provide for clear pathways.   Keep furniture in the same  place.   Eliminate or tape down electrical cables in high traffic areas.   Place handrails on both sides of stairways. Use handrails when going up or down stairs.   Most falls occur on the top or bottom 3 steps.   Fix any loose handrails. Make sure handrails on both sides of the stairways are as long as the stairs.   Remove all walkway obstacles.   Coil or tape electrical cords off to the side of walking areas and out of the way. If using many extension cords, have an electrician put in a new wall outlet to reduce or  eliminate them.   Make sure spills are cleaned up quickly and allow time for drying before walking on freshly cleaned floors.   Firmly attach carpet with non-skid or two-sided tape.   Keep frequently used items within easy reach.   Remove tripping hazards such as throw rugs and clutter in walkways. Never leave objects on stairs.   Get rid of throw rugs elsewhere if possible.   Eliminate uneven floor surfaces.   Make sure couches and chairs are easy to get into and out of.   Check carpeting to make sure it is firmly attached along stairs.   Make repairs to worn or loose carpet promptly.   Select a carpet pattern that does not visually hide the edge of steps.   Avoid placing throw rugs or scatter rugs at the top or bottom of stairways, or properly secure with carpet tape to prevent slippage.   Have an electrician put in a light switch at the top and bottom of the stairs.   Get light switches that glow.   Avoid the following practices: hurrying, inattention, obscured vision, carrying large loads, and wearing slip-on shoes.   Be aware of all pets.  KITCHEN  Place items that are used frequently, such as dishes and food, within easy reach.   Keep handles on pots and pans toward the center of the stove. Use back burners when possible.   Make sure spills are cleaned up quickly and allow time for drying.   Avoid walking on wet floors.   Avoid hot utensils and knives.   Position shelves so they are not too high or low.   Place commonly used objects within easy reach.   If necessary, use a sturdy step stool with a grab bar when reaching.   Make sure electrical cables are out of the way.   Do not use floor polish or wax that makes floors slippery.  OTHER HOME FALL PREVENTION STRATEGIES  Wear low heel or rubber sole shoes that are supportive and fit well.   Wear closed toe shoes.   Know and watch for side effects of medications. Have your caregiver or pharmacist look at  all your medicines, even over-the-counter medicines. Some medicines can make you sleepy or dizzy.   Exercise regularly. Exercise makes you stronger and improves your balance and coordination.   Limit use of alcohol.   Use eyeglasses if necessary and keep them clean. Have your vision checked every year.   Organize your household in a manner that minimizes the need to walk distances when hurried, or go up and down stairs unnecessarily. For example, have a phone placed on at least each floor of your home. If possible, have a phone beside each sitting or lying area where you spend the most time at home. Keep emergency numbers posted at all phones.   Use non-skid floor wax.   When using  a ladder, make sure:   The base is firm.   All ladder feet are on level ground.   The ladder is angled against the wall properly.   When climbing a ladder, face the ladder and hold the ladder rungs firmly.   If reaching, always keep your hips and body weight centered between the rails.   When using a stepladder, make sure it is fully opened and both spreaders are firmly locked.   Do not climb a closed stepladder.   Avoid climbing beyond the second step from the top of a stepladder and the 4th rung from the top of an extension ladder.   Learn and use mobility aids as needed.   Change positions slowly. Arise slowly from sitting and lying positions. Sit on the edge of your bed before getting to your feet.   If you have a history of falls, ask someone to add color or contrast paint or tape to grab bars and handrails in your home.   If you have a history of falls, ask someone to place contrasting color strips on first and last steps.   Install an electrical emergency response system if you need one, and know how to use it.   If you have a medical or other condition that causes you to have limited physical strength, it is important that you reach out to family and friends for occasional help.  FOR  CHILDREN:  If young children are in the home, use safety gates. At the top of stairs use screw-mounted gates; use pressure-mounted gates for the bottom of the stairs and doorways between rooms.   Young children should be taught to descend stairs on their stomachs, feet first, and later using the handrail.   Keep drawers fully closed to prevent them from being climbed on or pulled out entirely.   Move chairs, cribs, beds and other furniture away from windows.   Consider installing window guards on windows ground floor and up, unless they are emergency fire exits. Make sure they have easy release mechanisms.   Consider installing special locks that only allow the window to be opened to a certain height.   Never rely on window screens to prevent falls.   Never leave babies alone on changing tables, beds or sofas. Use a changing table that has a restraining strap.   When a child can pull to a standing position, the crib mattress should be adjusted to its lowest position. There should be at least 26 inches between the top rails of the crib drop side and the mattress. Toys, bumper pads, and other objects that can be used as steps to climb out should be removed from the crib.   On bunk beds never allow a child under age 69 to sleep on the top bunk. For older children, if the upper bunk is not against a wall, use guard rails on both sides. No matter how old a child is, keep the guard rails in place on the top bunk since children roll during sleep. Do not permit horseplay on bunks.   Grass and soil surfaces beneath backyard playground equipment should be replaced with hardwood chips, shredded wood mulch, sand, pea gravel, rubber, crushed stone, or another safer material at depths of at least 9 to 12 inches.   When riding bikes or using skates, skateboards, skis, or snowboards, require children to wear helmets. Look for those that have stickers stating that they meet or exceed safety standards.    Vertical posts or  pickets in deck, balcony, and stairway railings should be no more than 3 1/2 inches apart if a young baby will have access to the area. The space between horizontal rails or bars, and between the floor and the first horizontal rail or bar, should be no more than 3 1/2 inches.  Document Released: 05/18/2002 Document Revised: 05/17/2011 Document Reviewed: 03/17/2009 [facility] Patient Information 2012 [place], [place].      Shoulder Sprain A shoulder sprain is the result of damage to the tough, fiber-like tissues (ligaments) that help hold your shoulder in place. The ligaments may be stretched or torn. Besides the main shoulder joint (the ball and socket), there are several smaller joints that connect the bones in this area. A sprain usually involves one of those joints. Most often it is the acromioclavicular (or AC) joint. That is the joint that connects the collarbone (clavicle) and the shoulder blade (scapula) at the top point of the shoulder blade (acromion). A shoulder sprain is a mild form of what is called a shoulder separation. Recovering from a shoulder sprain may take some time. For some, pain lingers for several months. Most people recover without long term problems. CAUSES   A shoulder sprain is usually caused by some kind of trauma. This might be:   Falling on an outstretched arm.   Being hit hard on the shoulder.   Twisting the arm.   Shoulder sprains are more likely to occur in people who:   Play sports.   Have balance or coordination problems.  SYMPTOMS   Pain when you move your shoulder.   Limited ability to move the shoulder.   Swelling and tenderness on top of the shoulder.   Redness or warmth in the shoulder.   Bruising.   A change in the shape of the shoulder.  DIAGNOSIS  Your healthcare provider may:  Ask about your symptoms.   Ask about recent activity that might have caused those symptoms.   Examine your shoulder. You may be asked  to do simple exercises to test movement. The other shoulder will be examined for comparison.   Order some tests that provide a look inside the body. They can show the extent of the injury. The tests could include:   X-rays.   CT (computed tomography) scan.   MRI (magnetic resonance imaging) scan.  RISKS AND COMPLICATIONS  Loss of full shoulder motion.   Ongoing shoulder pain.  TREATMENT  How long it takes to recover from a shoulder sprain depends on how severe it was. Treatment options may include:  Rest. You should not use the arm or shoulder until it heals.   Ice. For 2 or 3 days after the injury, put an ice pack on the shoulder up to 4 times a day. It should stay on for 15 to 20 minutes each time. Wrap the ice in a towel so it does not touch your skin.   Over-the-counter medicine to relieve pain.   A sling or brace. This will keep the arm still while the shoulder is healing.   Physical therapy or rehabilitation exercises. These will help you regain strength and motion. Ask your healthcare provider when it is OK to begin these exercises.   Surgery. The need for surgery is rare with a sprained shoulder, but some people may need surgery to keep the joint in place and reduce pain.  HOME CARE INSTRUCTIONS   Ask your healthcare provider about what you should and should not do while your shoulder heals.  Make sure you know how to apply ice to the correct area of your shoulder.   Talk with your healthcare provider about which medications should be used for pain and swelling.   If rehabilitation therapy will be needed, ask your healthcare provider to refer you to a therapist. If it is not recommended, then ask about at-home exercises. Find out when exercise should begin.  SEEK MEDICAL CARE IF:  Your pain, swelling, or redness at the joint increases. SEEK IMMEDIATE MEDICAL CARE IF:   You have a fever.   You cannot move your arm or shoulder.  Document Released: 10/14/2008  Document Revised: 05/17/2011 Document Reviewed: 10/14/2008 [facility] Patient Information 2012 [place], [place].    Cryotherapy Cryotherapy means treatment with cold. Ice or gel packs can be used to reduce both pain and swelling. Ice is the most helpful within the first 24 to 48 hours after an injury or flareup from overusing a muscle or joint. Sprains, strains, spasms, burning pain, shooting pain, and aches can all be eased with ice. Ice can also be used when recovering from surgery. Ice is effective, has very few side effects, and is safe for most people to use. PRECAUTIONS  Ice is not a safe treatment option for people with:  Raynaud's phenomenon. This is a condition affecting small blood vessels in the extremities. Exposure to cold may cause your problems to return.   Cold hypersensitivity. There are many forms of cold hypersensitivity, including:   Cold urticaria. Red, itchy hives appear on the skin when the tissues begin to warm after being iced.   Cold erythema. This is a red, itchy rash caused by exposure to cold.   Cold hemoglobinuria. Red blood cells break down when the tissues begin to warm after being iced. The hemoglobin that carry oxygen are passed into the urine because they cannot combine with blood proteins fast enough.   Numbness or altered sensitivity in the area being iced.  If you have any of the following conditions, do not use ice until you have discussed cryotherapy with your caregiver:  Heart conditions, such as arrhythmia, angina, or chronic heart disease.   High blood pressure.   Healing wounds or open skin in the area being iced.   Current infections.   Rheumatoid arthritis.   Poor circulation.   Diabetes.  Ice slows the blood flow in the region it is applied. This is beneficial when trying to stop inflamed tissues from spreading irritating chemicals to surrounding tissues. However, if you expose your skin to cold temperatures for too long or without the  proper protection, you can damage your skin or nerves. Watch for signs of skin damage due to cold. HOME CARE INSTRUCTIONS Follow these tips to use ice and cold packs safely.  Place a dry or damp towel between the ice and skin. A damp towel will cool the skin more quickly, so you may need to shorten the time that the ice is used.   For a more rapid response, add gentle compression to the ice.   Ice for no more than 10 to 20 minutes at a time. The bonier the area you are icing, the less time it will take to get the benefits of ice.   Check your skin after 5 minutes to make sure there are no signs of a poor response to cold or skin damage.   Rest 20 minutes or more in between uses.   Once your skin is numb, you can end your treatment. You can  test numbness by very lightly touching your skin. The touch should be so light that you do not see the skin dimple from the pressure of your fingertip. When using ice, most people will feel these normal sensations in this order: cold, burning, aching, and numbness.   Do not use ice on someone who cannot communicate their responses to pain, such as small children or people with dementia.  HOW TO MAKE AN ICE PACK Ice packs are the most common way to use ice therapy. Other methods include ice massage, ice baths, and cryo-sprays. Muscle creams that cause a cold, tingly feeling do not offer the same benefits that ice offers and should not be used as a substitute unless recommended by your caregiver. To make an ice pack, do one of the following:  Place crushed ice or a bag of frozen vegetables in a sealable plastic bag. Squeeze out the excess air. Place this bag inside another plastic bag. Slide the bag into a pillowcase or place a damp towel between your skin and the bag.   Mix 3 parts water with 1 part rubbing alcohol. Freeze the mixture in a sealable plastic bag. When you remove the mixture from the freezer, it will be slushy. Squeeze out the excess air. Place  this bag inside another plastic bag. Slide the bag into a pillowcase or place a damp towel between your skin and the bag.  SEEK MEDICAL CARE IF:  You develop white spots on your skin. This may give the skin a blotchy (mottled) appearance.   Your skin turns blue or pale.   Your skin becomes waxy or hard.   Your swelling gets worse.  MAKE SURE YOU:   Understand these instructions.   Will watch your condition.   Will get help right away if you are not doing well or get worse.  Document Released: 01/22/2011 Document Revised: 05/17/2011 Document Reviewed: 01/22/2011 [facility] Patient Information 2012 [place], [place].

## 2011-08-31 NOTE — ED Provider Notes (Signed)
History     CSN: [id number]  Arrival date & time 08/31/11  1258   First MD Initiated Contact with Patient 08/31/11 1324      Chief Complaint  Patient presents with  . Extremity Weakness    Left arm pain    (Consider location/radiation/quality/duration/timing/severity/associated sxs/prior treatment) Patient is a 77 y.o. female presenting with extremity weakness. The history is provided by the patient and a relative.  Extremity Weakness Pertinent negatives include no chest pain, no abdominal pain, no headaches and no shortness of breath.  pt states fell today when trying to sit in chair.  Says when reached back to reach chair it slid, falling onto left side. C/o left shoulder/upper arm pain. Constant, dull, non radiating, worse w palpation/movement.  Denies head injury or headache. No faintness or dizziness. No neck or back pain. Denies radicular pain. No numbness/weakness. States had  Trip and fall last week w right clavicle fx, states that is feeling better. States otherwise health at baseline. Eating/drinking normally. On nv. No cp or sob. No abd pain. Denies other injury  Past Medical History  Diagnosis Date  . Chronic ITP (idiopathic thrombocytopenia)   . Atrial fibrillation with rapid ventricular response 11/2010  . Hypertension   . Chronic edema     BLE  . Thyroid disease     Hypothyroid    No past surgical history on file.  No family history on file.  History  Substance Use Topics  . Smoking status: Not on file  . Smokeless tobacco: Not on file  . Alcohol Use: Not on file    OB History    Grav Para Term Preterm Abortions TAB SAB Ect Mult Living                  Review of Systems  Constitutional: Negative for fever.  HENT: Negative for neck pain.   Eyes: Negative for visual disturbance.  Respiratory: Negative for shortness of breath.   Cardiovascular: Negative for chest pain.  Gastrointestinal: Negative for vomiting and abdominal pain.  Genitourinary:  Negative for dysuria and flank pain.  Musculoskeletal: Positive for extremity weakness. Negative for back pain.  Skin: Negative for wound.  Neurological: Negative for weakness, numbness and headaches.  Psychiatric/Behavioral: Negative for confusion.    Allergies  Sulfa antibiotics  Home Medications   Current Outpatient Rx  Name Route Sig Dispense Refill  . ALENDRONATE SODIUM 70 MG PO TABS Oral Take 70 mg by mouth every 30 (thirty) days. Monthly    . CALCIUM GLUCONATE 500 MG PO CAPS Oral Take 2 capsules by mouth daily.      Marland Kitchen DILTIAZEM HCL ER COATED BEADS 360 MG PO CP24 Oral Take 360 mg by mouth daily.     . FUROSEMIDE 20 MG PO TABS Oral Take 20 mg by mouth daily as needed. swelling    . HYDROCODONE-ACETAMINOPHEN 5-325 MG PO TABS Oral Take 1 tablet by mouth every 6 (six) hours as needed. For pain relief    . LEVOTHYROXINE SODIUM 100 MCG PO TABS Oral Take 100 mcg by mouth daily.      Marland Kitchen LOSARTAN POTASSIUM 50 MG PO TABS Oral Take 25 mg by mouth daily.      Marland Kitchen METOPROLOL TARTRATE 25 MG PO TABS Oral Take 25 mg by mouth 2 (two) times daily.    . MULTIVITAMIN PO Oral Take 1 tablet by mouth daily.      Marland Kitchen ECHINACEA ACZ PO Oral Take by mouth daily.      Marland Kitchen  POTASSIUM CHLORIDE ER 10 MEQ PO TBCR Oral Take 5 mEq by mouth daily.    . SERTRALINE HCL 100 MG PO TABS Oral Take 100 mg by mouth daily.      Marland Kitchen SIMVASTATIN 40 MG PO TABS Oral Take 40 mg by mouth every evening.    Marland Kitchen TRAMADOL HCL 50 MG PO TABS Oral Take 1 tablet (50 mg total) by mouth every 8 (eight) hours as needed for pain. 15 tablet 0  . WARFARIN SODIUM 5 MG PO TABS Oral Take 2.5-5 mg by mouth daily. 2.5 mg Monday and Friday 5 mg every remaining day      BP 125/63  Pulse 79  Temp(Src) 98 F (36.7 C) (Oral)  Resp 18  SpO2 97%  Physical Exam  Nursing note and vitals reviewed. Constitutional: She is oriented to person, place, and time. She appears well-developed and well-nourished. No distress.  HENT:  Head: Atraumatic.  Eyes:  Conjunctivae are normal. Pupils are equal, round, and reactive to light. No scleral icterus.  Neck: Normal range of motion. Neck supple. No tracheal deviation present.  Cardiovascular: Normal rate, normal heart sounds and intact distal pulses.   Pulmonary/Chest: Effort normal and breath sounds normal. No respiratory distress.  Abdominal: Soft. Normal appearance and bowel sounds are normal. She exhibits no distension. There is no tenderness.  Musculoskeletal: Normal range of motion. She exhibits no edema.       Tenderness left shoulder and upper arm. No significant sts noted. Good rom at shoulder and elbow passively without pain. Radial pulse 2+. Good rom bil ext, no other focal bony tenderness noted (x right clavicle).  ctls spine non tender, aligned, no step off.   Neurological: She is alert and oriented to person, place, and time.       Motor intact bil.   Skin: Skin is warm and dry. No rash noted.  Psychiatric: She has a normal mood and affect.    ED Course  Procedures (including critical care time)   Labs Reviewed  PROTIME-INR    Results for orders placed during the hospital encounter of 08/31/11  PROTIME-INR      Component Value Range   Prothrombin Time 34.0 (*) 11.6 - 15.2 (seconds)   INR 3.29 (*) 0.00 - 1.49   CBC      Component Value Range   WBC 7.8  4.0 - 10.5 (K/uL)   RBC 4.34  3.87 - 5.11 (MIL/uL)   Hemoglobin 12.7  12.0 - 15.0 (g/dL)   HCT 16.1  09.6 - 04.5 (%)   MCV 88.2  78.0 - 100.0 (fL)   MCH 29.3  26.0 - 34.0 (pg)   MCHC 33.2  30.0 - 36.0 (g/dL)   RDW 40.9  81.1 - 91.4 (%)   Platelets 131 (*) 150 - 400 (K/uL)  BASIC METABOLIC PANEL      Component Value Range   Sodium 136  135 - 145 (mEq/L)   Potassium 3.8  3.5 - 5.1 (mEq/L)   Chloride 101  96 - 112 (mEq/L)   CO2 29  19 - 32 (mEq/L)   Glucose, Bld 174 (*) 70 - 99 (mg/dL)   BUN 15  6 - 23 (mg/dL)   Creatinine, Ser 7.82  0.50 - 1.10 (mg/dL)   Calcium 9.0  8.4 - 95.6 (mg/dL)   GFR calc non Af Amer 82 (*)  >90 (mL/min)   GFR calc Af Amer >90  >90 (mL/min)     Dg Shoulder Left  08/31/2011  *RADIOLOGY REPORT*  Clinical Data: Fall  LEFT SHOULDER - 2+ VIEW  Comparison: None  Findings: No acute fracture and no dislocation.  Anatomic alignment of the glenoid with respect to the humeral head.  Mild degenerative change of the [facility] joint and superolateral humeral head.  IMPRESSION: No acute bony pathology.  Chronic change.  Original Report Authenticated By: Donavan Burnet, M.D.   Dg Humerus Left  08/31/2011  *RADIOLOGY REPORT*  Clinical Data: Left shoulder pain  LEFT HUMERUS - 2+ VIEW  Comparison: None  Findings: Osteopenia.  No acute fracture and no dislocation.  IMPRESSION: No acute bony pathology.  Original Report Authenticated By: Donavan Burnet, M.D.      MDM  Xrays. Pt declines pain med.     Date: 08/31/2011  Rate: 64  Rhythm: atrial fibrillation  QRS Axis: normal  Intervals: normal  ST/T Wave abnormalities: nonspecific ST/T changes  Conduction Disutrbances:first-degree A-V block   Narrative Interpretation:   Old EKG Reviewed: unchanged   Recheck pt comfortable. Family present, they indicate they have plan to assist pt at home.   Pt denies any loc, or headache. No contusion to head on exam. Discussed xrays and labs. Discussed diff dx including shoulder sprain, rotator cuff injury, etc.   Given recent falls, despite afib, discussed w pt and fam need for close pcp f/u and discuss whether to continue coumadin given fall risk.     Suzi Roots, MD 08/31/11 [phone]

## 2011-08-31 NOTE — ED Notes (Signed)
[id number]> Expected date:<BR> Expected time:12:40 PM<BR> Means of arrival:<BR> Comments:<BR> M31 - 89yoF Fall fr standing, arm injury

## 2011-12-21 ENCOUNTER — Emergency Department (HOSPITAL_COMMUNITY): Payer: Medicare Other

## 2011-12-21 ENCOUNTER — Encounter (HOSPITAL_COMMUNITY): Payer: Self-pay | Admitting: Emergency Medicine

## 2011-12-21 ENCOUNTER — Emergency Department (HOSPITAL_COMMUNITY)
Admission: EM | Admit: 2011-12-21 | Discharge: 2011-12-21 | Disposition: A | Discharge status: Home / Self Care | Payer: Medicare Other | Attending: Emergency Medicine | Admitting: Emergency Medicine

## 2011-12-21 DIAGNOSIS — S60229A Contusion of unspecified hand, initial encounter: Secondary | ICD-10-CM | POA: Insufficient documentation

## 2011-12-21 DIAGNOSIS — E079 Disorder of thyroid, unspecified: Secondary | ICD-10-CM | POA: Insufficient documentation

## 2011-12-21 DIAGNOSIS — I10 Essential (primary) hypertension: Secondary | ICD-10-CM | POA: Insufficient documentation

## 2011-12-21 DIAGNOSIS — T07XXXA Unspecified multiple injuries, initial encounter: Secondary | ICD-10-CM

## 2011-12-21 DIAGNOSIS — S0990XA Unspecified injury of head, initial encounter: Secondary | ICD-10-CM | POA: Insufficient documentation

## 2011-12-21 DIAGNOSIS — S40019A Contusion of unspecified shoulder, initial encounter: Secondary | ICD-10-CM | POA: Insufficient documentation

## 2011-12-21 DIAGNOSIS — I4891 Unspecified atrial fibrillation: Secondary | ICD-10-CM | POA: Insufficient documentation

## 2011-12-21 DIAGNOSIS — W010XXA Fall on same level from slipping, tripping and stumbling without subsequent striking against object, initial encounter: Secondary | ICD-10-CM | POA: Insufficient documentation

## 2011-12-21 DIAGNOSIS — Z79899 Other long term (current) drug therapy: Secondary | ICD-10-CM | POA: Insufficient documentation

## 2011-12-21 DIAGNOSIS — Y9289 Other specified places as the place of occurrence of the external cause: Secondary | ICD-10-CM | POA: Insufficient documentation

## 2011-12-21 NOTE — ED Notes (Addendum)
Pt states she tripped and fell yesterday at work.  She has LT hand/wrist pain & swelling and pain to LT shoulder.  She wears glasses and has LT periorbital bruising also.  Denies any other injuries.

## 2011-12-21 NOTE — ED Notes (Signed)
Tripped and fell at work yesterday, landed on left side, swelling to left hand/wrist, no LOC, ecchymosis to left eye-

## 2011-12-21 NOTE — ED Provider Notes (Signed)
History     CSN: [id number]  Arrival date & time 12/21/11  1209   First MD Initiated Contact with Patient 12/21/11 1235      Chief Complaint  Patient presents with  . Hand Pain  . Shoulder Pain  . Fall    from standing position at work    (Consider location/radiation/quality/duration/timing/severity/associated sxs/prior treatment) Patient is a 78 y.o. female presenting with hand pain, shoulder pain, and fall. The history is provided by the patient and a relative.  Hand Pain  Shoulder Pain  Fall   patient here after tripping at work yesterday and fall onto carpeted floor striking the left side of her face and injuring her left hand. No loss of consciousness. Denies headache, neck pain, chest pain, hip pain. She has full range of use of her left hand denies any pain at the wrist. No blurred vision. She is on Coumadin. No medications used prior to arrival.  Past Medical History  Diagnosis Date  . Chronic ITP (idiopathic thrombocytopenia)   . Atrial fibrillation with rapid ventricular response 11/2010  . Hypertension   . Chronic edema     BLE  . Thyroid disease     Hypothyroid    History reviewed. No pertinent past surgical history.  History reviewed. No pertinent family history.  History  Substance Use Topics  . Smoking status: Not on file  . Smokeless tobacco: Not on file  . Alcohol Use: Not on file    OB History    Grav Para Term Preterm Abortions TAB SAB Ect Mult Living                  Review of Systems  All other systems reviewed and are negative.    Allergies  Sulfa antibiotics  Home Medications   Current Outpatient Rx  Name Route Sig Dispense Refill  . ALENDRONATE SODIUM 70 MG PO TABS Oral Take 70 mg by mouth every 30 (thirty) days. Monthly    . CALCIUM GLUCONATE 500 MG PO CAPS Oral Take 2 capsules by mouth daily.      Marland Kitchen DILTIAZEM HCL ER COATED BEADS 360 MG PO CP24 Oral Take 360 mg by mouth daily.     . FUROSEMIDE 20 MG PO TABS Oral Take 20 mg  by mouth daily as needed. swelling    . HYDROCODONE-ACETAMINOPHEN 5-325 MG PO TABS Oral Take 1 tablet by mouth every 6 (six) hours as needed. For pain relief    . LEVOTHYROXINE SODIUM 100 MCG PO TABS Oral Take 100 mcg by mouth daily.      Marland Kitchen LOSARTAN POTASSIUM 50 MG PO TABS Oral Take 25 mg by mouth daily.      Marland Kitchen METOPROLOL TARTRATE 25 MG PO TABS Oral Take 25 mg by mouth 2 (two) times daily.    . MULTIVITAMIN PO Oral Take 1 tablet by mouth daily.      Marland Kitchen ECHINACEA ACZ PO Oral Take by mouth daily.      Marland Kitchen POTASSIUM CHLORIDE ER 10 MEQ PO TBCR Oral Take 5 mEq by mouth daily.    . SERTRALINE HCL 100 MG PO TABS Oral Take 100 mg by mouth daily.      Marland Kitchen SIMVASTATIN 40 MG PO TABS Oral Take 40 mg by mouth every evening.    . WARFARIN SODIUM 5 MG PO TABS Oral Take 2.5-5 mg by mouth daily. 2.5 mg Monday and Friday 5 mg every remaining day      BP 112/65  Pulse 74  Temp  98.3 F (36.8 C) (Oral)  Resp 18  SpO2 98%  Physical Exam  Nursing note and vitals reviewed. Constitutional: She is oriented to person, place, and time. Vital signs are normal. She appears well-developed and well-nourished.  Non-toxic appearance. No distress.  HENT:  Head: Normocephalic and atraumatic.    Eyes: Conjunctivae, EOM and lids are normal. Pupils are equal, round, and reactive to light.  Neck: Normal range of motion. Neck supple. No tracheal deviation present. No mass present.  Cardiovascular: Normal rate, regular rhythm and normal heart sounds.  Exam reveals no gallop.   No murmur heard. Pulmonary/Chest: Effort normal and breath sounds normal. No stridor. No respiratory distress. She has no decreased breath sounds. She has no wheezes. She has no rhonchi. She has no rales.  Abdominal: Soft. Normal appearance and bowel sounds are normal. She exhibits no distension. There is no tenderness. There is no rebound and no CVA tenderness.  Musculoskeletal: Normal range of motion. She exhibits no edema and no tenderness.       Left  hand with brusing and swelling, full range of motion, skin intact, radial pulse 2 plus  Neurological: She is alert and oriented to person, place, and time. She has normal strength. No cranial nerve deficit or sensory deficit. GCS eye subscore is 4. GCS verbal subscore is 5. GCS motor subscore is 6.  Skin: Skin is warm and dry. No abrasion and no rash noted.  Psychiatric: She has a normal mood and affect. Her speech is normal and behavior is normal.    ED Course  Procedures (including critical care time)  Labs Reviewed - No data to display No results found.   No diagnosis found.    MDM  Patient's x-rays are negative. She has contusions she'll be discharged home        Toy Baker, MD 12/21/11 1417

## 2011-12-21 NOTE — ED Notes (Addendum)
To discharge per w/c

## 2011-12-21 NOTE — ED Notes (Signed)
Received pt to Rm 12. Alert, oriented. Left wrist/hand swollen and discolored but pt moves it freely. Small contusion lateral aspect of left eye. Denies LOC. Daughter at bedsidel

## 2012-04-17 ENCOUNTER — Other Ambulatory Visit (HOSPITAL_BASED_OUTPATIENT_CLINIC_OR_DEPARTMENT_OTHER): Payer: Medicare Other | Admitting: Lab

## 2012-04-17 ENCOUNTER — Ambulatory Visit (HOSPITAL_BASED_OUTPATIENT_CLINIC_OR_DEPARTMENT_OTHER): Payer: Medicare Other | Admitting: Oncology

## 2012-04-17 ENCOUNTER — Telehealth: Payer: Self-pay | Admitting: Oncology

## 2012-04-17 VITALS — BP 114/64 | HR 63 | Temp 97.1°F | Resp 18 | Ht 62.0 in | Wt 150.3 lb

## 2012-04-17 DIAGNOSIS — Z23 Encounter for immunization: Secondary | ICD-10-CM

## 2012-04-17 DIAGNOSIS — I4891 Unspecified atrial fibrillation: Secondary | ICD-10-CM

## 2012-04-17 DIAGNOSIS — D696 Thrombocytopenia, unspecified: Secondary | ICD-10-CM

## 2012-04-17 DIAGNOSIS — D693 Immune thrombocytopenic purpura: Secondary | ICD-10-CM

## 2012-04-17 LAB — CBC WITH DIFFERENTIAL/PLATELET
BASO%: 0.4 % (ref 0.0–2.0)
HCT: 36.6 % (ref 34.8–46.6)
LYMPH%: 21.7 % (ref 14.0–49.7)
MCH: 30.6 pg (ref 25.1–34.0)
MCHC: 34.3 g/dL (ref 31.5–36.0)
MCV: 89.2 fL (ref 79.5–101.0)
MONO#: 0.6 10*3/uL (ref 0.1–0.9)
NEUT%: 67.7 % (ref 38.4–76.8)
Platelets: 117 10*3/uL — ABNORMAL LOW (ref 145–400)
WBC: 7.2 10*3/uL (ref 3.9–10.3)

## 2012-04-17 MED ORDER — INFLUENZA VIRUS VACC SPLIT PF IM SUSP
0.5000 mL | Freq: Once | INTRAMUSCULAR | Status: AC
  Administered 2012-04-17: 0.5 mL via INTRAMUSCULAR
  Filled 2012-04-17: qty 0.5

## 2012-04-17 NOTE — Progress Notes (Signed)
Reports she retired from working at [employer] 1 month ago. Has had few falls past year. Suggested she use cane when out of the home.

## 2012-04-17 NOTE — Telephone Encounter (Signed)
Gave pt appt for lab and MD next year November 2014

## 2012-04-17 NOTE — Progress Notes (Signed)
   [facility]    OFFICE PROGRESS NOTE   INTERVAL HISTORY:   She returns as scheduled. No bleeding. She reports several falls over the past year after tripping. No dizziness. She is maintained on Coumadin for atrial fibrillation.  Objective:  Vital signs in last 24 hours:  Blood pressure 114/64, pulse 63, temperature 97.1 F (36.2 C), temperature source Oral, resp. rate 18, height 5\' 2"  (1.575 m), weight 150 lb 4.8 oz (68.176 kg).    HEENT: Neck without mass Lymphatics: No cervical, supraclavicular, axillary, or inguinal nodes Resp: Lungs clear bilaterally Cardio: Irregular GI: No hepatosplenomegaly Vascular: No leg edema   Lab Results:  Lab Results  Component Value Date   WBC 7.2 04/17/2012   HGB 12.5 04/17/2012   HCT 36.6 04/17/2012   MCV 89.2 04/17/2012   PLT 117* 04/17/2012   ANC 4.9, absolute lymphocyte count 1.6    Medications: I have reviewed the patient's current medications.  Assessment/Plan: 1. Thrombocytopenia-the platelet count is stable 2. Anticoagulation-Ms. Zynda is now maintained on Coumadin anticoagulation for atrial fibrillation. The Coumadin is followed through her cardiologist's office.  3. Atrial fibrillation.   Disposition:  She is stable from a hematologic standpoint. The chronic thrombocytopenia is likely related to ITP versus a low-grade lymphoproliferative disorder. No evidence of a progressive lymphoproliferative disorder today. She will return for an office visit and CBC in one year. She knows to contact us in the interim for bruising or bleeding.  Ms. Truman received an influenza vaccine today.   Thornton Papas, MD  04/17/2012  5:30 PM

## 2012-06-19 ENCOUNTER — Encounter (HOSPITAL_COMMUNITY): Payer: Self-pay | Admitting: *Deleted

## 2012-06-19 ENCOUNTER — Inpatient Hospital Stay (HOSPITAL_COMMUNITY)
Admission: EM | Admit: 2012-06-19 | Discharge: 2012-06-26 | DRG: 483 | Disposition: A | Discharge status: Home Health | Payer: Medicare Other | Attending: Internal Medicine | Admitting: Internal Medicine

## 2012-06-19 ENCOUNTER — Emergency Department (HOSPITAL_COMMUNITY): Payer: Medicare Other

## 2012-06-19 DIAGNOSIS — S2239XA Fracture of one rib, unspecified side, initial encounter for closed fracture: Secondary | ICD-10-CM

## 2012-06-19 DIAGNOSIS — E876 Hypokalemia: Secondary | ICD-10-CM | POA: Diagnosis not present

## 2012-06-19 DIAGNOSIS — M1990 Unspecified osteoarthritis, unspecified site: Secondary | ICD-10-CM | POA: Diagnosis present

## 2012-06-19 DIAGNOSIS — K5900 Constipation, unspecified: Secondary | ICD-10-CM | POA: Diagnosis not present

## 2012-06-19 DIAGNOSIS — Z7901 Long term (current) use of anticoagulants: Secondary | ICD-10-CM

## 2012-06-19 DIAGNOSIS — F3289 Other specified depressive episodes: Secondary | ICD-10-CM | POA: Diagnosis present

## 2012-06-19 DIAGNOSIS — Z9181 History of falling: Secondary | ICD-10-CM

## 2012-06-19 DIAGNOSIS — Y92009 Unspecified place in unspecified non-institutional (private) residence as the place of occurrence of the external cause: Secondary | ICD-10-CM

## 2012-06-19 DIAGNOSIS — E119 Type 2 diabetes mellitus without complications: Secondary | ICD-10-CM | POA: Diagnosis present

## 2012-06-19 DIAGNOSIS — R609 Edema, unspecified: Secondary | ICD-10-CM | POA: Diagnosis present

## 2012-06-19 DIAGNOSIS — W19XXXA Unspecified fall, initial encounter: Secondary | ICD-10-CM | POA: Diagnosis present

## 2012-06-19 DIAGNOSIS — Y9301 Activity, walking, marching and hiking: Secondary | ICD-10-CM

## 2012-06-19 DIAGNOSIS — S43429A Sprain of unspecified rotator cuff capsule, initial encounter: Secondary | ICD-10-CM | POA: Diagnosis present

## 2012-06-19 DIAGNOSIS — S42213A Unspecified displaced fracture of surgical neck of unspecified humerus, initial encounter for closed fracture: Principal | ICD-10-CM | POA: Diagnosis present

## 2012-06-19 DIAGNOSIS — I10 Essential (primary) hypertension: Secondary | ICD-10-CM | POA: Diagnosis present

## 2012-06-19 DIAGNOSIS — I4891 Unspecified atrial fibrillation: Secondary | ICD-10-CM

## 2012-06-19 DIAGNOSIS — Z79899 Other long term (current) drug therapy: Secondary | ICD-10-CM

## 2012-06-19 DIAGNOSIS — E039 Hypothyroidism, unspecified: Secondary | ICD-10-CM | POA: Diagnosis present

## 2012-06-19 DIAGNOSIS — F329 Major depressive disorder, single episode, unspecified: Secondary | ICD-10-CM | POA: Diagnosis present

## 2012-06-19 DIAGNOSIS — S42201A Unspecified fracture of upper end of right humerus, initial encounter for closed fracture: Secondary | ICD-10-CM | POA: Diagnosis present

## 2012-06-19 DIAGNOSIS — D693 Immune thrombocytopenic purpura: Secondary | ICD-10-CM | POA: Diagnosis present

## 2012-06-19 DIAGNOSIS — S42301A Unspecified fracture of shaft of humerus, right arm, initial encounter for closed fracture: Secondary | ICD-10-CM | POA: Diagnosis present

## 2012-06-19 LAB — CBC WITH DIFFERENTIAL/PLATELET
Basophils Relative: 0 % (ref 0–1)
Eosinophils Absolute: 0.2 10*3/uL (ref 0.0–0.7)
Eosinophils Relative: 2 % (ref 0–5)
HCT: 36.8 % (ref 36.0–46.0)
Hemoglobin: 12.2 g/dL (ref 12.0–15.0)
Lymphs Abs: 1.7 10*3/uL (ref 0.7–4.0)
MCH: 29.4 pg (ref 26.0–34.0)
MCHC: 33.2 g/dL (ref 30.0–36.0)
MCV: 88.7 fL (ref 78.0–100.0)
Monocytes Absolute: 0.7 10*3/uL (ref 0.1–1.0)
Monocytes Relative: 6 % (ref 3–12)
Neutrophils Relative %: 75 % (ref 43–77)
RBC: 4.15 MIL/uL (ref 3.87–5.11)

## 2012-06-19 LAB — BASIC METABOLIC PANEL
BUN: 15 mg/dL (ref 6–23)
Calcium: 9 mg/dL (ref 8.4–10.5)
Creatinine, Ser: 0.62 mg/dL (ref 0.50–1.10)
GFR calc Af Amer: 90 mL/min — ABNORMAL LOW (ref >90)
GFR calc non Af Amer: 78 mL/min — ABNORMAL LOW (ref >90)
Glucose, Bld: 178 mg/dL — ABNORMAL HIGH (ref 70–99)

## 2012-06-19 MED ORDER — HYDROMORPHONE HCL PF 1 MG/ML IJ SOLN
0.5000 mg | Freq: Once | INTRAMUSCULAR | Status: AC
  Administered 2012-06-19: 0.5 mg via INTRAVENOUS

## 2012-06-19 MED ORDER — FENTANYL CITRATE 0.05 MG/ML IJ SOLN
50.0000 ug | Freq: Once | INTRAMUSCULAR | Status: AC
  Administered 2012-06-19: 50 ug via INTRAVENOUS
  Filled 2012-06-19: qty 2

## 2012-06-19 MED ORDER — HYDROMORPHONE HCL PF 1 MG/ML IJ SOLN
INTRAMUSCULAR | Status: AC
  Administered 2012-06-19: 0.5 mg via INTRAVENOUS
  Filled 2012-06-19: qty 1

## 2012-06-19 NOTE — ED Notes (Signed)
OZH:[id number]<HQ> Expected date:06/19/12<BR> Expected time: 8:35 PM<BR> Means of arrival:Ambulance<BR> Comments:<BR> Fall/shoulder deformity

## 2012-06-19 NOTE — ED Notes (Signed)
Per EMS pt arrives from home. Pt slipped going down 1 stepl. No LOC, pt slipped and landed on right shoulder. EMS reports deformity to right shoulder. Pt on LSB with head blocks. Pt was given of fentanyl via IV by EMS.

## 2012-06-19 NOTE — ED Provider Notes (Signed)
History     CSN: [id number]  Arrival date & time 06/19/12  2042   First MD Initiated Contact with Patient 06/19/12 2102      Chief Complaint  Patient presents with  . Fall    (Consider location/radiation/quality/duration/timing/severity/associated sxs/prior treatment) HPI Comments: Hayley [name] is a 79 y.o. female who was walking onto her porch today, when she fell. She relates the fall to be uneven surface, going from inside to out. She injured her right shoulder in the fall. She was unable to get up. She denies hitting her head. There is no noted loss of consciousness. She denies headache, weakness, dizziness, back pain, chest pain, abdominal pain, shortness of breath, or cough. She's had prior falls. She broke her right clavicle several months ago. The pain in the right shoulder is worse with touch and movement. There are no alleviating factors.  Patient is a 79 y.o. female presenting with fall. The history is provided by the patient.  Fall    Past Medical History  Diagnosis Date  . Chronic ITP (idiopathic thrombocytopenia)   . Atrial fibrillation with rapid ventricular response 11/2010  . Hypertension   . Chronic edema     BLE  . Thyroid disease     Hypothyroid    History reviewed. No pertinent past surgical history.  History reviewed. No pertinent family history.  History  Substance Use Topics  . Smoking status: Not on file  . Smokeless tobacco: Not on file  . Alcohol Use: Not on file    OB History    Grav Para Term Preterm Abortions TAB SAB Ect Mult Living                  Review of Systems  All other systems reviewed and are negative.    Allergies  Sulfa antibiotics and Ultram  Home Medications   Current Outpatient Rx  Name  Route  Sig  Dispense  Refill  . ALENDRONATE SODIUM 70 MG PO TABS   Oral   Take 70 mg by mouth every 30 (thirty) days. Take with a full glass of water on an empty stomach.         . ALPRAZOLAM 0.25 MG PO TABS   Oral  Take 0.25 mg by mouth 3 (three) times daily as needed. For anxiety.         . ATORVASTATIN CALCIUM 40 MG PO TABS   Oral   Take 40 mg by mouth daily.         Marland Kitchen BIOTIN PO   Oral   Take 1 capsule by mouth daily.         Marland Kitchen CALCIUM CARBONATE-VITAMIN D 500-200 MG-UNIT PO TABS   Oral   Take 1 tablet by mouth daily.         Marland Kitchen VITAMIN D 1000 UNITS PO TABS   Oral   Take 1,000 Units by mouth daily.         Marland Kitchen DILTIAZEM HCL ER COATED BEADS 360 MG PO CP24   Oral   Take 360 mg by mouth daily.          . FUROSEMIDE 20 MG PO TABS   Oral   Take 20 mg by mouth daily as needed. For swelling.         Marland Kitchen LEVOTHYROXINE SODIUM 100 MCG PO TABS   Oral   Take 100 mcg by mouth daily.           Marland Kitchen METOPROLOL TARTRATE 25 MG PO TABS  Oral   Take 25 mg by mouth 2 (two) times daily.         . ADULT MULTIVITAMIN W/MINERALS CH   Oral   Take 1 tablet by mouth daily.         Marland Kitchen ECHINACEA ACZ PO   Oral   Take by mouth daily.           Marland Kitchen POTASSIUM CHLORIDE CRYS ER 20 MEQ PO TBCR   Oral   Take 10 mEq by mouth daily.          . SERTRALINE HCL 100 MG PO TABS   Oral   Take 100 mg by mouth daily.           Marland Kitchen VITAMIN C 500 MG PO TABS   Oral   Take 500 mg by mouth daily.         . WARFARIN SODIUM 5 MG PO TABS   Oral   Take 2.5-5 mg by mouth daily. 1 tab daily except for 0.5 tab on Mondays and Fridays.           BP 160/88  Pulse 67  Temp 98.6 F (37 C) (Oral)  Resp 18  SpO2 96%  Physical Exam  Nursing note and vitals reviewed. Constitutional: She is oriented to person, place, and time. She appears well-developed and well-nourished.  HENT:  Head: Normocephalic and atraumatic.  Eyes: Conjunctivae normal and EOM are normal. Pupils are equal, round, and reactive to light.  Neck: Normal range of motion and phonation normal. Neck supple.  Cardiovascular: Normal rate, regular rhythm and intact distal pulses.   Pulmonary/Chest: Effort normal and breath sounds normal.  She exhibits no tenderness.  Abdominal: Soft. She exhibits no distension. There is no tenderness. There is no guarding.  Musculoskeletal: Normal range of motion.       Right clavicle deformity with medial migration of the right shoulder. Neurovascular intact distally in the right arm.  Neurological: She is alert and oriented to person, place, and time. She has normal strength. She exhibits normal muscle tone.  Skin: Skin is warm and dry.  Psychiatric: She has a normal mood and affect. Her behavior is normal. Judgment and thought content normal.    ED Course  Procedures (including critical care time)  Emergency department treatment: IV, fentanyl, and IV Dilaudid  Case discussed with Dr. Rennis Chris, on-call orthopedist. He, states that the patient can be treated with a shoulder immobilizer, and see him in the office tomorrow afternoon (06/20/12).   ambulation trial- failed , secondary to pain in shoulder   Labs Reviewed  CBC WITH DIFFERENTIAL - Abnormal; Notable for the following:    Platelets 134 (*)     All other components within normal limits  BASIC METABOLIC PANEL - Abnormal; Notable for the following:    Glucose, Bld 178 (*)     GFR calc non Af Amer 78 (*)     GFR calc Af Amer 90 (*)     All other components within normal limits  URINALYSIS, ROUTINE W REFLEX MICROSCOPIC - Abnormal; Notable for the following:    Glucose, UA 250 (*)     All other components within normal limits  URINE CULTURE   Dg Chest 1 View  06/19/2012  *RADIOLOGY REPORT*  Clinical Data: Fall, pain  CHEST - 1 VIEW  Comparison: 12/08/2010  Findings: Cardiomegaly with mild vascular congestion.  Calcified tortuous aorta.  No pulmonary edema or focal infiltrates.  No effusion or pneumothorax. Suspected right lateral rib fractures,  incompletely evaluated on this chest radiograph. Please see shoulder dictation for additional findings related to the right humerus. Chronic deformity right clavicle.  IMPRESSION: Cardiomegaly  with mild vascular congestion.  No active infiltrates. Suspected right-sided rib fractures, possibly acute. Acute right humeral fracture.   Original Report Authenticated By: Davonna Belling, M.D.    Dg Shoulder Right  06/19/2012  *RADIOLOGY REPORT*  Clinical Data: Fall, pain.  RIGHT SHOULDER - 2+ VIEW  Comparison: Plain films right shoulder 08/23/2011.  Findings: The patient has an acute surgical neck fracture of the right humerus which involves the greater tuberosity.  There is at least one shaft width anterior displacement.  The acromioclavicular joint is intact with some degenerative change noted.  Right clavicle fracture seen on the prior study has healed with one and one half shaft width inferior displacement of the distal fragment.  IMPRESSION:  1.  Anteriorly displaced surgical neck fracture right humerus involves the greater tuberosity. 2.  Interval healing of a right clavicle fracture.   Original Report Authenticated By: Holley Dexter, M.D.      1. Fall   2. Closed fracture of right humerus   3. Rib fractures       MDM  Fall, without clear cause. Patient sustained shoulder and rib fractures. She's unable to be mobilized in the ED. She will need to be admitted for observation and management.  We'll consult the hospitalist to admit the patient until she can be managed by orthopedics and seen by physical therapy for help with ambulation.        Flint Melter, MD 06/20/12 0230

## 2012-06-20 ENCOUNTER — Encounter (HOSPITAL_COMMUNITY): Payer: Self-pay | Admitting: Internal Medicine

## 2012-06-20 DIAGNOSIS — S42301A Unspecified fracture of shaft of humerus, right arm, initial encounter for closed fracture: Secondary | ICD-10-CM | POA: Diagnosis present

## 2012-06-20 DIAGNOSIS — W19XXXA Unspecified fall, initial encounter: Secondary | ICD-10-CM

## 2012-06-20 DIAGNOSIS — S42309A Unspecified fracture of shaft of humerus, unspecified arm, initial encounter for closed fracture: Secondary | ICD-10-CM

## 2012-06-20 DIAGNOSIS — I4891 Unspecified atrial fibrillation: Secondary | ICD-10-CM

## 2012-06-20 DIAGNOSIS — I10 Essential (primary) hypertension: Secondary | ICD-10-CM | POA: Diagnosis present

## 2012-06-20 DIAGNOSIS — E039 Hypothyroidism, unspecified: Secondary | ICD-10-CM

## 2012-06-20 LAB — URINALYSIS, ROUTINE W REFLEX MICROSCOPIC
Bilirubin Urine: NEGATIVE
Glucose, UA: 250 mg/dL — AB
Hgb urine dipstick: NEGATIVE
Ketones, ur: NEGATIVE mg/dL
Leukocytes, UA: NEGATIVE
Nitrite: NEGATIVE
Protein, ur: NEGATIVE mg/dL
Specific Gravity, Urine: 1.018 (ref 1.005–1.030)
Urobilinogen, UA: 0.2 mg/dL (ref 0.0–1.0)
pH: 6.5 (ref 5.0–8.0)

## 2012-06-20 LAB — PROTIME-INR: Prothrombin Time: 27.6 seconds — ABNORMAL HIGH (ref 11.6–15.2)

## 2012-06-20 MED ORDER — VITAMIN C 500 MG PO TABS
500.0000 mg | ORAL_TABLET | Freq: Every day | ORAL | Status: DC
  Administered 2012-06-20 – 2012-06-26 (×7): 500 mg via ORAL
  Filled 2012-06-20 (×7): qty 1

## 2012-06-20 MED ORDER — ATORVASTATIN CALCIUM 40 MG PO TABS
40.0000 mg | ORAL_TABLET | Freq: Every day | ORAL | Status: DC
  Administered 2012-06-20 – 2012-06-24 (×4): 40 mg via ORAL
  Filled 2012-06-20 (×8): qty 1

## 2012-06-20 MED ORDER — ALPRAZOLAM 0.25 MG PO TABS
0.2500 mg | ORAL_TABLET | Freq: Three times a day (TID) | ORAL | Status: DC | PRN
  Administered 2012-06-24 (×2): 0.25 mg via ORAL
  Filled 2012-06-20 (×2): qty 1

## 2012-06-20 MED ORDER — POTASSIUM CHLORIDE CRYS ER 10 MEQ PO TBCR
10.0000 meq | EXTENDED_RELEASE_TABLET | Freq: Every day | ORAL | Status: DC
  Administered 2012-06-20 – 2012-06-26 (×7): 10 meq via ORAL
  Filled 2012-06-20 (×7): qty 1

## 2012-06-20 MED ORDER — SODIUM CHLORIDE 0.9 % IJ SOLN: 3.0000 mL | INTRAMUSCULAR | Status: DC | PRN

## 2012-06-20 MED ORDER — ONDANSETRON HCL 4 MG/2ML IJ SOLN: 4.0000 mg | Freq: Four times a day (QID) | INTRAMUSCULAR | Status: DC | PRN

## 2012-06-20 MED ORDER — METOPROLOL TARTRATE 25 MG PO TABS
25.0000 mg | ORAL_TABLET | Freq: Two times a day (BID) | ORAL | Status: DC
  Administered 2012-06-20 – 2012-06-26 (×13): 25 mg via ORAL
  Filled 2012-06-20 (×15): qty 1

## 2012-06-20 MED ORDER — HYDROMORPHONE HCL PF 1 MG/ML IJ SOLN
0.5000 mg | Freq: Once | INTRAMUSCULAR | Status: AC
  Administered 2012-06-20: 0.5 mg via INTRAVENOUS
  Filled 2012-06-20: qty 1

## 2012-06-20 MED ORDER — LEVOTHYROXINE SODIUM 100 MCG PO TABS
100.0000 ug | ORAL_TABLET | Freq: Every day | ORAL | Status: DC
  Administered 2012-06-20 – 2012-06-26 (×7): 100 ug via ORAL
  Filled 2012-06-20 (×9): qty 1

## 2012-06-20 MED ORDER — CALCIUM CARBONATE-VITAMIN D 500-200 MG-UNIT PO TABS
1.0000 | ORAL_TABLET | Freq: Every day | ORAL | Status: DC
  Administered 2012-06-20 – 2012-06-26 (×7): 1 via ORAL
  Filled 2012-06-20 (×7): qty 1

## 2012-06-20 MED ORDER — DOCUSATE SODIUM 100 MG PO CAPS
100.0000 mg | ORAL_CAPSULE | Freq: Two times a day (BID) | ORAL | Status: DC
  Administered 2012-06-20 – 2012-06-26 (×13): 100 mg via ORAL
  Filled 2012-06-20 (×15): qty 1

## 2012-06-20 MED ORDER — SODIUM CHLORIDE 0.9 % IJ SOLN
3.0000 mL | Freq: Two times a day (BID) | INTRAMUSCULAR | Status: DC
  Administered 2012-06-20 – 2012-06-21 (×4): 3 mL via INTRAVENOUS

## 2012-06-20 MED ORDER — SODIUM CHLORIDE 0.9 % IJ SOLN
3.0000 mL | Freq: Two times a day (BID) | INTRAMUSCULAR | Status: DC
  Administered 2012-06-20 (×2): 3 mL via INTRAVENOUS
  Administered 2012-06-21: 22:00:00 via INTRAVENOUS
  Administered 2012-06-21 – 2012-06-22 (×3): 3 mL via INTRAVENOUS

## 2012-06-20 MED ORDER — ADULT MULTIVITAMIN W/MINERALS CH
1.0000 | ORAL_TABLET | Freq: Every day | ORAL | Status: DC
  Administered 2012-06-20 – 2012-06-26 (×7): 1 via ORAL
  Filled 2012-06-20 (×7): qty 1

## 2012-06-20 MED ORDER — SERTRALINE HCL 100 MG PO TABS
100.0000 mg | ORAL_TABLET | Freq: Every day | ORAL | Status: DC
  Administered 2012-06-20 – 2012-06-26 (×7): 100 mg via ORAL
  Filled 2012-06-20 (×7): qty 1

## 2012-06-20 MED ORDER — SODIUM CHLORIDE 0.9 % IV SOLN: 250.0000 mL | INTRAVENOUS | Status: DC | PRN

## 2012-06-20 MED ORDER — HYDROMORPHONE HCL PF 1 MG/ML IJ SOLN
0.5000 mg | INTRAMUSCULAR | Status: DC | PRN
  Administered 2012-06-20 (×3): 0.5 mg via INTRAVENOUS
  Filled 2012-06-20 (×3): qty 1

## 2012-06-20 MED ORDER — ONDANSETRON HCL 4 MG PO TABS: 4.0000 mg | ORAL_TABLET | Freq: Four times a day (QID) | ORAL | Status: DC | PRN

## 2012-06-20 MED ORDER — VITAMIN D3 25 MCG (1000 UNIT) PO TABS
1000.0000 [IU] | ORAL_TABLET | Freq: Every day | ORAL | Status: DC
  Administered 2012-06-20 – 2012-06-26 (×7): 1000 [IU] via ORAL
  Filled 2012-06-20 (×7): qty 1

## 2012-06-20 MED ORDER — DILTIAZEM HCL ER COATED BEADS 360 MG PO CP24
360.0000 mg | ORAL_CAPSULE | Freq: Every day | ORAL | Status: DC
  Administered 2012-06-20 – 2012-06-26 (×7): 360 mg via ORAL
  Filled 2012-06-20 (×7): qty 1

## 2012-06-20 NOTE — Progress Notes (Signed)
Patient seen and evaluated earlier this AM by my associate.  Please be sure to check patient's h and p for further details regarding assessment and plan.  I agree with holding coumadin.  Will place order for SCD's at this juncture.    Will f/u with physical therapy evaluation recommendations.  Penny Pia   Will reevaluate next am.

## 2012-06-20 NOTE — Evaluation (Signed)
Physical Therapy Evaluation Patient Details Name: Hayley [name] MRN: [id number] DOB: [birth year] Today's Date: 06/20/2012 Time: 1105-1140 PT Time Calculation (min): 35 min  PT Assessment / Plan / Recommendation Clinical Impression  pt s/p fall resulting in right humerus fx and will benefit from PT to maximize independence for return home with family assist    PT Assessment  Patient needs continued PT services    Follow Up Recommendations  Home health PT;Supervision/Assistance - 24 hour (24* initially)    Does the patient have the potential to tolerate intense rehabilitation      Barriers to Discharge None      Equipment Recommendations  Other (comment) (dtr to get cane)    Recommendations for Other Services     Frequency Min 4X/week    Precautions / Restrictions Precautions Precautions: Fall Required Braces or Orthoses: Other Brace/Splint (right shoulder sling/immobilizer (pt not wearing waist belt))   Pertinent Vitals/Pain       Mobility  Bed Mobility Bed Mobility: Not assessed (on BSC with nsg) Transfers Transfers: Sit to Stand;Stand to Sit Sit to Stand: From chair/3-in-1;4: Min assist Stand to Sit: To chair/3-in-1;4: Min assist Details for Transfer Assistance: cues for safety, NWB RUE Ambulation/Gait Ambulation/Gait Assistance: 4: Min assist Ambulation Distance (Feet): 15 Feet Assistive device: Straight cane;1 person hand held assist Ambulation/Gait Assistance Details: cues for use of cane and safety Gait Pattern: Step-to pattern;Narrow base of support;Trunk flexed General Gait Details: unsteady    Shoulder Instructions     Exercises     PT Diagnosis: Difficulty walking  PT Problem List: Decreased activity tolerance;Decreased balance;Decreased safety awareness;Decreased knowledge of precautions;Decreased knowledge of use of DME PT Treatment Interventions: DME instruction;Gait training;Functional mobility training;Therapeutic activities;Therapeutic  exercise;Balance training;Patient/family education   PT Goals Acute Rehab PT Goals PT Goal Formulation: With patient Time For Goal Achievement: 06/27/12 Potential to Achieve Goals: Good Pt will go Supine/Side to Sit: with min assist PT Goal: Supine/Side to Sit - Progress: Goal set today Pt will go Sit to Supine/Side: with min assist PT Goal: Sit to Supine/Side - Progress: Goal set today Pt will go Sit to Stand: with supervision PT Goal: Sit to Stand - Progress: Goal set today Pt will go Stand to Sit: with supervision PT Goal: Stand to Sit - Progress: Goal set today Pt will Ambulate: 16 - 50 feet;with supervision;with least restrictive assistive device PT Goal: Ambulate - Progress: Goal set today  Visit Information  Last PT Received On: 06/20/12 Assistance Needed: +1    Subjective Data  Subjective: I really don't want to. Patient Stated Goal: home   Prior Functioning  Home Living Lives With: Daughter (2 daughters) Available Help at Discharge: Family Type of Home: House Home Access: Stairs to enter Entrance Stairs-Number of Steps: 1 Home Layout: Two level;Able to live on main level with bedroom/bathroom Home Adaptive Equipment: Bedside commode/3-in-1;Shower chair with back Additional Comments: sister renting w/c or transport chair Prior Function Level of Independence: Independent Driving: Yes Vocation: Retired Comments: retired in August 2013 Communication Communication: No difficulties    Cognition  Overall Cognitive Status: Impaired Area of Impairment: Memory;Safety/judgement Arousal/Alertness: Awake/alert Orientation Level: Disoriented to;Place;Time Behavior During Session: WFL for tasks performed Memory: Decreased recall of precautions Memory Deficits: pt unable to recall events Safety/Judgement: Decreased awareness of safety precautions;Decreased awareness of need for assistance    Extremity/Trunk Assessment Right Upper Extremity Assessment RUE  ROM/Strength/Tone: [facility] for tasks assessed Left Upper Extremity Assessment LUE ROM/Strength/Tone: Deficits;Due to precautions LUE ROM/Strength/Tone Deficits: wiggles fingers, not further  tested Right Lower Extremity Assessment RLE ROM/Strength/Tone: [facility] for tasks assessed Left Lower Extremity Assessment LLE ROM/Strength/Tone: [facility] for tasks assessed   Balance Static Standing Balance Static Standing - Balance Support: Left upper extremity supported Static Standing - Level of Assistance: 4: Min assist  End of Session PT - End of Session Activity Tolerance: Patient tolerated treatment well Patient left: in chair;with call bell/phone within reach;with family/visitor present  GP     [facility] 06/20/2012, 12:17 PM

## 2012-06-20 NOTE — ED Notes (Signed)
Dr. Dierdre Highman, Dr. Conley Rolls, and family at bedside.  Pain meds given.

## 2012-06-20 NOTE — Consult Note (Signed)
Reason for Consult:r shoulder fracture Referring Physician: hospitalists  Hayley [name] is an 79 y.o. female.  HPI: 79 yo female who fell earlier today.  Pt admitted for pain control and because of history of a-fib and multiple falls.  She is c/o pain over prox humerus.  She denies numbness and tingling over r arm.  Past Medical History  Diagnosis Date  . Chronic ITP (idiopathic thrombocytopenia)   . Atrial fibrillation with rapid ventricular response 11/2010  . Hypertension   . Chronic edema     BLE  . Thyroid disease     Hypothyroid    History reviewed. No pertinent past surgical history.  History reviewed. No pertinent family history.  Social History:  reports that she has never smoked. She does not have any smokeless tobacco history on file. Her alcohol and drug histories not on file.  Allergies:  Allergies  Allergen Reactions  . Sulfa Antibiotics Hives  . Ultram (Tramadol)     Hallucinations.    Medications: I have reviewed the patient's current medications.  Results for orders placed during the hospital encounter of 06/19/12 (from the past 48 hour(s))  CBC WITH DIFFERENTIAL     Status: Abnormal   Collection Time   06/19/12  9:15 PM      Component Value Range Comment   WBC 10.2  4.0 - 10.5 K/uL    RBC 4.15  3.87 - 5.11 MIL/uL    Hemoglobin 12.2  12.0 - 15.0 g/dL    HCT 16.1  09.6 - 04.5 %    MCV 88.7  78.0 - 100.0 fL    MCH 29.4  26.0 - 34.0 pg    MCHC 33.2  30.0 - 36.0 g/dL    RDW 40.9  81.1 - 91.4 %    Platelets 134 (*) 150 - 400 K/uL    Neutrophils Relative 75  43 - 77 %    Neutro Abs 7.7  1.7 - 7.7 K/uL    Lymphocytes Relative 17  12 - 46 %    Lymphs Abs 1.7  0.7 - 4.0 K/uL    Monocytes Relative 6  3 - 12 %    Monocytes Absolute 0.7  0.1 - 1.0 K/uL    Eosinophils Relative 2  0 - 5 %    Eosinophils Absolute 0.2  0.0 - 0.7 K/uL    Basophils Relative 0  0 - 1 %    Basophils Absolute 0.0  0.0 - 0.1 K/uL   BASIC METABOLIC PANEL     Status: Abnormal   Collection Time   06/19/12  9:15 PM      Component Value Range Comment   Sodium 135  135 - 145 mEq/L    Potassium 3.9  3.5 - 5.1 mEq/L    Chloride 99  96 - 112 mEq/L    CO2 27  19 - 32 mEq/L    Glucose, Bld 178 (*) 70 - 99 mg/dL    BUN 15  6 - 23 mg/dL    Creatinine, Ser 7.82  0.50 - 1.10 mg/dL    Calcium 9.0  8.4 - 95.6 mg/dL    GFR calc non Af Amer 78 (*) >90 mL/min    GFR calc Af Amer 90 (*) >90 mL/min   URINALYSIS, ROUTINE W REFLEX MICROSCOPIC     Status: Abnormal   Collection Time   06/20/12 12:20 AM      Component Value Range Comment   Color, Urine YELLOW  YELLOW    APPearance  CLEAR  CLEAR    Specific Gravity, Urine 1.018  1.005 - 1.030    pH 6.5  5.0 - 8.0    Glucose, UA 250 (*) NEGATIVE mg/dL    Hgb urine dipstick NEGATIVE  NEGATIVE    Bilirubin Urine NEGATIVE  NEGATIVE    Ketones, ur NEGATIVE  NEGATIVE mg/dL    Protein, ur NEGATIVE  NEGATIVE mg/dL    Urobilinogen, UA 0.2  0.0 - 1.0 mg/dL    Nitrite NEGATIVE  NEGATIVE    Leukocytes, UA NEGATIVE  NEGATIVE MICROSCOPIC NOT DONE ON URINES WITH NEGATIVE PROTEIN, BLOOD, LEUKOCYTES, NITRITE, OR GLUCOSE <1000 mg/dL.  TSH     Status: Normal   Collection Time   06/20/12  5:20 AM      Component Value Range Comment   TSH 1.102  0.350 - 4.500 uIU/mL   PROTIME-INR     Status: Abnormal   Collection Time   06/20/12  5:20 AM      Component Value Range Comment   Prothrombin Time 27.6 (*) 11.6 - 15.2 seconds    INR 2.73 (*) 0.00 - 1.49     Dg Chest 1 View  06/19/2012  *RADIOLOGY REPORT*  Clinical Data: Fall, pain  CHEST - 1 VIEW  Comparison: 12/08/2010  Findings: Cardiomegaly with mild vascular congestion.  Calcified tortuous aorta.  No pulmonary edema or focal infiltrates.  No effusion or pneumothorax. Suspected right lateral rib fractures, incompletely evaluated on this chest radiograph. Please see shoulder dictation for additional findings related to the right humerus. Chronic deformity right clavicle.  IMPRESSION: Cardiomegaly with mild  vascular congestion.  No active infiltrates. Suspected right-sided rib fractures, possibly acute. Acute right humeral fracture.   Original Report Authenticated By: Davonna Belling, M.D.    Dg Shoulder Right  06/19/2012  *RADIOLOGY REPORT*  Clinical Data: Fall, pain.  RIGHT SHOULDER - 2+ VIEW  Comparison: Plain films right shoulder 08/23/2011.  Findings: The patient has an acute surgical neck fracture of the right humerus which involves the greater tuberosity.  There is at least one shaft width anterior displacement.  The acromioclavicular joint is intact with some degenerative change noted.  Right clavicle fracture seen on the prior study has healed with one and one half shaft width inferior displacement of the distal fragment.  IMPRESSION:  1.  Anteriorly displaced surgical neck fracture right humerus involves the greater tuberosity. 2.  Interval healing of a right clavicle fracture.   Original Report Authenticated By: Holley Dexter, M.D.     ROS: I have reviewed the patient's review of systems thoroughly and there are no positive responses as relates to the HPI. EXAM: Blood pressure 151/76, pulse 103, temperature 98.2 F (36.8 C), temperature source Oral, resp. rate 18, height 5\' 1"  (1.549 m), weight 70.6 kg (155 lb 10.3 oz), SpO2 98.00%. Well-developed well-nourished patient in no acute distress. Alert and oriented x3 HEENT:within normal limits Cardiac: Regular rate and rhythm Pulmonary: Lungs clear to auscultation Abdomen: Soft and nontender.  Normal active bowel sounds  Musculoskeletal: R arm swollen and pain through rom. NVI distalluy  Assessment/Plan: 79yo female with at least 3 part proximal humerus fracture.  Pt is a vital woman and may benefit from ORIF but i will need more information to make a good decision  I will get a ct scan with recontruction and make a plan for treatment after that.  Jodean Valade L 06/20/2012, 6:22 PM

## 2012-06-20 NOTE — ED Notes (Signed)
Pt unable to ambulate.  MD made aware.

## 2012-06-20 NOTE — Progress Notes (Signed)
Pt alert and oriented.  She is exhibiting signs of slight confusion as to situation and place but is easily redirected/reoriented.  Family is at the bedside at all times.  MPennington RN

## 2012-06-20 NOTE — ED Notes (Signed)
MD at bedside. 

## 2012-06-20 NOTE — H&P (Signed)
[facility] History and Physical  Hayley [name]:[id number] DOB: [birth year]    PCP:   Gwen Pounds, MD   Chief Complaint: [name] and broke her right arm.  HPI: Hayley [name] is an 79 y.o. female with hx of atrial fibrillation on Coumadin, HTN, but no hx of DM, prior CVA or CHF (CHADS=2), with hx of very frequent falls, ITP, fell again tonight and suffered a displaced fracture of her right humerus.  She denied LOC, palpitation, chest pain, fever or chills.  She had at least 4-5 falls this year, and had broken her right clavicle with her prior fall.  She also has ?acute rib fracture as well on the right side.  She has a normal WBC and normal HB.  Her platelet count is 134 K.  She has normal renal fx tests.  Hospitalist was asked to admit her as she is not able to function with her new fracture.  Orthopedics were consulted as well by the ER MD.  Rewiew of Systems:  Constitutional: Negative for malaise, fever and chills. No significant weight loss or weight gain Eyes: Negative for eye pain, redness and discharge, diplopia, visual changes, or flashes of light. ENMT: Negative for ear pain, hoarseness, nasal congestion, sinus pressure and sore throat. No headaches; tinnitus, drooling, or problem swallowing. Cardiovascular: Negative for chest pain, palpitations, diaphoresis, dyspnea and peripheral edema. ; No orthopnea, PND Respiratory: Negative for cough, hemoptysis, wheezing and stridor. No pleuritic chestpain. Gastrointestinal: Negative for nausea, vomiting, diarrhea, constipation, abdominal pain, melena, blood in stool, hematemesis, jaundice and rectal bleeding.    Genitourinary: Negative for frequency, dysuria, incontinence,flank pain and hematuria; Musculoskeletal: Negative for back pain and neck pain. Negative for swelling  Skin: . Negative for pruritus, rash, abrasions, bruising and skin lesion.; ulcerations Neuro: Negative for headache, lightheadedness and neck stiffness.  Negative for weakness, altered level of consciousness , altered mental status, extremity weakness, burning feet, involuntary movement, seizure and syncope.  Psych: negative for anxiety, depression, insomnia, tearfulness, panic attacks, hallucinations, paranoia, suicidal or homicidal ideation    Past Medical History  Diagnosis Date  . Chronic ITP (idiopathic thrombocytopenia)   . Atrial fibrillation with rapid ventricular response 11/2010  . Hypertension   . Chronic edema     BLE  . Thyroid disease     Hypothyroid    History reviewed. No pertinent past surgical history.  Medications:  HOME MEDS: Prior to Admission medications   Medication Sig Start Date End Date Taking? Authorizing Provider  alendronate (FOSAMAX) 70 MG tablet Take 70 mg by mouth every 30 (thirty) days. Take with a full glass of water on an empty stomach.   Yes Historical Provider, MD  ALPRAZolam (XANAX) 0.25 MG tablet Take 0.25 mg by mouth 3 (three) times daily as needed. For anxiety.   Yes Historical Provider, MD  atorvastatin (LIPITOR) 40 MG tablet Take 40 mg by mouth daily.   Yes Historical Provider, MD  BIOTIN PO Take 1 capsule by mouth daily.   Yes Historical Provider, MD  calcium-vitamin D (OSCAL WITH D) 500-200 MG-UNIT per tablet Take 1 tablet by mouth daily.   Yes Historical Provider, MD  cholecalciferol (VITAMIN D) 1000 UNITS tablet Take 1,000 Units by mouth daily.   Yes Historical Provider, MD  diltiazem (CARDIZEM CD) 360 MG 24 hr capsule Take 360 mg by mouth daily.    Yes Historical Provider, MD  furosemide (LASIX) 20 MG tablet Take 20 mg by mouth daily as needed. For swelling.   Yes Historical  Provider, MD  levothyroxine (SYNTHROID, LEVOTHROID) 100 MCG tablet Take 100 mcg by mouth daily.     Yes Historical Provider, MD  metoprolol tartrate (LOPRESSOR) 25 MG tablet Take 25 mg by mouth 2 (two) times daily.   Yes Historical Provider, MD  Multiple Vitamin (MULTIVITAMIN WITH MINERALS) TABS Take 1 tablet by mouth  daily.   Yes Historical Provider, MD  Multiple Vitamins-Minerals (ECHINACEA ACZ PO) Take by mouth daily.     Yes Historical Provider, MD  potassium chloride SA (K-DUR,KLOR-CON) 20 MEQ tablet Take 10 mEq by mouth daily.    Yes Historical Provider, MD  sertraline (ZOLOFT) 100 MG tablet Take 100 mg by mouth daily.     Yes Historical Provider, MD  vitamin C (ASCORBIC ACID) 500 MG tablet Take 500 mg by mouth daily.   Yes Historical Provider, MD  warfarin (COUMADIN) 5 MG tablet Take 2.5-5 mg by mouth daily. 1 tab daily except for 0.5 tab on Mondays and Fridays.   Yes Historical Provider, MD     Allergies:  Allergies  Allergen Reactions  . Sulfa Antibiotics Hives  . Ultram (Tramadol)     Hallucinations.    Social History:   does not have a smoking history on file. She does not have any smokeless tobacco history on file. Her alcohol and drug histories not on file.  Family History: History reviewed. No pertinent family history.   Physical Exam: Filed Vitals:   06/19/12 2053  BP: 160/88  Pulse: 67  Temp: 98.6 F (37 C)  TempSrc: Oral  Resp: 18  SpO2: 96%   Blood pressure 160/88, pulse 67, temperature 98.6 F (37 C), temperature source Oral, resp. rate 18, SpO2 96.00%.  GEN:  Pleasant patient lying in the stretcher in no acute distress; cooperative with exam. PSYCH:  alert and oriented x4; does not appear anxious or depressed; affect is appropriate. HEENT: Mucous membranes pink and anicteric; PERRLA; EOM intact; no cervical lymphadenopathy nor thyromegaly or carotid bruit; no JVD; There were no stridor. Neck is very supple. Breasts:: Not examined CHEST WALL: No tenderness CHEST: Normal respiration, clear to auscultation bilaterally.  HEART: Regular rate and rhythm.  There are no murmur, rub, or gallops.   BACK: No kyphosis or scoliosis; no CVA tenderness ABDOMEN: soft and non-tender; no masses, no organomegaly, normal abdominal bowel sounds; no pannus; no intertriginous candida.  There is no rebound and no distention. Rectal Exam: Not done EXTREMITIES: No bone or joint deformity; age-appropriate arthropathy of the hands and knees; no edema; no ulcerations.  There is no calf tenderness. Genitalia: not examined PULSES: 2+ and symmetric SKIN: Normal hydration no rash or ulceration CNS: Cranial nerves 2-12 grossly intact no focal lateralizing neurologic deficit.  Speech is fluent; uvula elevated with phonation, facial symmetry and tongue midline. DTR are normal bilaterally, cerebella exam is intact, barbinski is negative and strengths are equaled bilaterally.  No sensory loss.   Labs on Admission:  Basic Metabolic Panel:  Lab 06/19/12 4098  NA 135  K 3.9  CL 99  CO2 27  GLUCOSE 178*  BUN 15  CREATININE 0.62  CALCIUM 9.0  MG --  PHOS --   Liver Function Tests: No results found for this basename: AST:5,ALT:5,ALKPHOS:5,BILITOT:5,PROT:5,ALBUMIN:5 in the last 168 hours No results found for this basename: LIPASE:5,AMYLASE:5 in the last 168 hours No results found for this basename: AMMONIA:5 in the last 168 hours CBC:  Lab 06/19/12 2115  WBC 10.2  NEUTROABS 7.7  HGB 12.2  HCT 36.8  MCV 88.7  PLT 134*   Cardiac Enzymes: No results found for this basename: CKTOTAL:5,CKMB:5,CKMBINDEX:5,TROPONINI:5 in the last 168 hours  CBG: No results found for this basename: GLUCAP:5 in the last 168 hours   Radiological Exams on Admission: Dg Chest 1 View  06/19/2012  *RADIOLOGY REPORT*  Clinical Data: Fall, pain  CHEST - 1 VIEW  Comparison: 12/08/2010  Findings: Cardiomegaly with mild vascular congestion.  Calcified tortuous aorta.  No pulmonary edema or focal infiltrates.  No effusion or pneumothorax. Suspected right lateral rib fractures, incompletely evaluated on this chest radiograph. Please see shoulder dictation for additional findings related to the right humerus. Chronic deformity right clavicle.  IMPRESSION: Cardiomegaly with mild vascular congestion.  No active  infiltrates. Suspected right-sided rib fractures, possibly acute. Acute right humeral fracture.   Original Report Authenticated By: Davonna Belling, M.D.    Dg Shoulder Right  06/19/2012  *RADIOLOGY REPORT*  Clinical Data: Fall, pain.  RIGHT SHOULDER - 2+ VIEW  Comparison: Plain films right shoulder 08/23/2011.  Findings: The patient has an acute surgical neck fracture of the right humerus which involves the greater tuberosity.  There is at least one shaft width anterior displacement.  The acromioclavicular joint is intact with some degenerative change noted.  Right clavicle fracture seen on the prior study has healed with one and one half shaft width inferior displacement of the distal fragment.  IMPRESSION:  1.  Anteriorly displaced surgical neck fracture right humerus involves the greater tuberosity. 2.  Interval healing of a right clavicle fracture.   Original Report Authenticated By: Holley Dexter, M.D.      Assessment/Plan Present on Admission:  . ITP (idiopathic thrombocytopenic purpura) . Closed fracture of right humerus . A-fib . Hypothyroidism   PLAN:  She will be admitted for further evaluation of her right humerus fracture.  I suspect she will benefit with surgical repair, but will leave it to ortho.  With her hx of hypothyroidism, will continue supplement and check TSH. She has a CHADS of 2, and with her hx of frequent falls and history of ITP, I think she should be off of her Coumadin.  Will d/c her coumadin also as she may need Sx.  I have checked an INR and if it approaches normal, please start DVT prophylaxis.  I have informed her and her family to check  with Dr Herbie Baltimore, her cardiologist, with respect to long term anticoagulation.  She is stable, full code, and will be admitted to [facility] service.  Thank you for allowing me to partake in the care of your lovely patient.  Other plans as per orders.  Code Status: FULL Unk Lightning, MD. [facility] Pager [phone] 7pm  to 7am.  06/20/2012, 3:41 AM

## 2012-06-20 NOTE — Progress Notes (Addendum)
Patient ID: Hayley [name], female   DOB: Aug 12, 1920, 79 y.o.   MRN: [id number] I was apparently consulted in the night on this patient.  I did not receive this consult.  She was admitted to the internal medicine service for significant pain related to her proximal humerus fracture.  I have reviewed the x-rays which show a proximal humerus fracture with displacement.  I would initially treat this patient in a sling to see if the anterior displacement will reduce as the patient sits up, stands, and walks.  If the displacement does not reduce we would discuss with the patient the possibility of internal fixation.  I think this eventuality is unlikely and we will see her and evaluate the her later today.if there are any questions about her care I can be reached at [phone].  Full consult note to follow after evaluation which will likely be 5 PM today.

## 2012-06-21 ENCOUNTER — Inpatient Hospital Stay (HOSPITAL_COMMUNITY): Payer: Medicare Other

## 2012-06-21 LAB — URINE CULTURE: Culture: NO GROWTH

## 2012-06-21 LAB — PROTIME-INR: INR: 2.77 — ABNORMAL HIGH (ref 0.00–1.49)

## 2012-06-21 MED ORDER — ACETAMINOPHEN 325 MG PO TABS
650.0000 mg | ORAL_TABLET | Freq: Four times a day (QID) | ORAL | Status: DC | PRN
  Administered 2012-06-21 – 2012-06-25 (×6): 650 mg via ORAL
  Filled 2012-06-21 (×6): qty 2

## 2012-06-21 MED ORDER — OXYCODONE HCL 5 MG PO TABS
5.0000 mg | ORAL_TABLET | Freq: Four times a day (QID) | ORAL | Status: DC | PRN
  Administered 2012-06-22 – 2012-06-24 (×3): 5 mg via ORAL
  Filled 2012-06-21 (×4): qty 1

## 2012-06-21 NOTE — Progress Notes (Signed)
Subjective: The patient continues to complain of pain in the right upper extremity.  I have spoken with her family about her overall condition today.  Objective: Vital signs in last 24 hours: Temp:  [98 F (36.7 C)-98.2 F (36.8 C)] 98.2 F (36.8 C) (01/11 0527) Pulse Rate:  [86-92] 89  (01/11 0527) Resp:  [18-19] 18  (01/11 0527) BP: (122-140)/(70-80) 140/80 mmHg (01/11 0527) SpO2:  [96 %-97 %] 96 % (01/11 0527)  Intake/Output from previous day: 01/10 0701 - 01/11 0700 In: 120 [P.O.:120] Out: 1250 [Urine:1250] Intake/Output this shift: Total I/O In: 250 [P.O.:250] Out: 500 [Urine:500]   Basename 06/19/12 2115  HGB 12.2    Basename 06/19/12 2115  WBC 10.2  RBC 4.15  HCT 36.8  PLT 134*    Basename 06/19/12 2115  NA 135  K 3.9  CL 99  CO2 27  BUN 15  CREATININE 0.62  GLUCOSE 178*  CALCIUM 9.0    Basename 06/21/12 0814 06/20/12 0520  LABPT -- --  INR 2.77* 2.73*    I did not examine the patient directly to day  CT SCAN: I reviewed the patient's CAT scan including the 3-D reconstructions.  This unfortunately shows that she has a four-part proximal humerus fracture with a head splitting component. Assessment/Plan:  79 year old female with four-part proximal humerus fracture with head splitting component.  Her INR continues to be elevated at 2.77.  The patient will need hemiarthroplasty of the shoulder and would appear this would need to be done on Monday given her elevated INR.  I am going to discuss the case with her internal medicine doctors but I think she will need transfer to [place] as Mondays are difficult to get on additional orthopedic cases other than the elective schedule.  We will observe her INR over the next 2 days and if necessary think about and FFP on Monday prior to her case which should start around 2:30 PM.  I have discussed this plan with the patient and her family and they are understanding and wished to proceed.I have had a prolonged  discussion with the patient regarding the risk and benefits of the surgical procedure.  The patient understands the risks include but are not limited to bleeding, infection and failure of the surgery to cure the problem and need for further surgery.  The patient understands there is a slight risk of death at the time of surgery.  The patient understands these risks along with the potential benefits and wishes to proceed with surgical intervention.  The patient will discuss the full surgical procedure with Darl Pikes, our surgical scheduler, and the surgery will be set up at the patient's convenience.  The patient will be followed in the office in the postoperative period.   Daion Ginsberg L 06/21/2012, 4:36 PM

## 2012-06-21 NOTE — Progress Notes (Signed)
[facility] PROGRESS NOTE  SIDNEY SILBERMAN ZOX:[id number] DOB: 09-28-1920 DOA: 06/19/2012 PCP: Gwen Pounds, MD  Assessment/Plan: 1. Closed fracture of right humerus - CT scan of humerus to help orthopaedic surgeons make further plans - Per ortho at this juncture - Pain control. Patient mentions that tramadol is too strong and was on dilaudid for pain control. As such have discontinued and will place on tylenol and oxycodone IR only for breakthrough pain medication.  2. Afib  - rate controlled at this juncture continue current rate controlling medications - agree with hold coumadin as patient may require ORIF - monitor INR levels  3. Hypothyroidism - TSH wnl at 1.102 - continue synthroid at home dose at this juncture.  4. HTN - exacerbated due to pain from # 1 but relatively well controlled on current regimen - continue to monitor and adjust medications pending further BP values.   Code Status: full Family Communication: Spoke with patient and family member at bedside Disposition Plan: Pending further recommendations from ortho after they have finished their evaluation   Consultants:  Orthopaedic surgeon: Dr. Luiz Blare  Procedures:  Pending further evaluation  Antibiotics:  none  HPI/Subjective: Patient has no new complaints at this juncture. Pain is currently tolerable and is requesting to discontinue the stronger pain medication (dilaudid).   Objective: Filed Vitals:   06/20/12 2110 06/20/12 2113 06/20/12 2116 06/21/12 0527  BP: 122/75 123/77 133/70 140/80  Pulse: 92 86  89  Temp: 98 F (36.7 C)   98.2 F (36.8 C)  TempSrc: Oral   Oral  Resp: 19   18  Height:      Weight:      SpO2: 97%   96%    Intake/Output Summary (Last 24 hours) at 06/21/12 1348 Last data filed at 06/21/12 1040  Gross per 24 hour  Intake    250 ml  Output   1125 ml  Net   -875 ml   Filed Weights   06/20/12 0421  Weight: 70.6 kg (155 lb 10.3 oz)    Exam:   General:   Pt in NAD, Alert and Awake  Cardiovascular: irregular, No MRG  Respiratory: CTA BL, no wheezes  Abdomen: soft, NT, ND  Extremities: RUE in sling  Data Reviewed: Basic Metabolic Panel:  Lab 06/19/12 8119  NA 135  K 3.9  CL 99  CO2 27  GLUCOSE 178*  BUN 15  CREATININE 0.62  CALCIUM 9.0  MG --  PHOS --   Liver Function Tests: No results found for this basename: AST:5,ALT:5,ALKPHOS:5,BILITOT:5,PROT:5,ALBUMIN:5 in the last 168 hours No results found for this basename: LIPASE:5,AMYLASE:5 in the last 168 hours No results found for this basename: AMMONIA:5 in the last 168 hours CBC:  Lab 06/19/12 2115  WBC 10.2  NEUTROABS 7.7  HGB 12.2  HCT 36.8  MCV 88.7  PLT 134*   Cardiac Enzymes: No results found for this basename: CKTOTAL:5,CKMB:5,CKMBINDEX:5,TROPONINI:5 in the last 168 hours BNP (last 3 results) No results found for this basename: PROBNP:3 in the last 8760 hours CBG: No results found for this basename: GLUCAP:5 in the last 168 hours  Recent Results (from the past 240 hour(s))  URINE CULTURE     Status: Normal   Collection Time   06/20/12 12:20 AM      Component Value Range Status Comment   Specimen Description URINE, CATHETERIZED   Final    Special Requests NONE   Final    Culture  Setup Time 06/20/2012 03:43   Final  Colony Count NO GROWTH   Final    Culture NO GROWTH   Final    Report Status 06/21/2012 FINAL   Final      Studies: Dg Chest 1 View  06/19/2012  *RADIOLOGY REPORT*  Clinical Data: Fall, pain  CHEST - 1 VIEW  Comparison: 12/08/2010  Findings: Cardiomegaly with mild vascular congestion.  Calcified tortuous aorta.  No pulmonary edema or focal infiltrates.  No effusion or pneumothorax. Suspected right lateral rib fractures, incompletely evaluated on this chest radiograph. Please see shoulder dictation for additional findings related to the right humerus. Chronic deformity right clavicle.  IMPRESSION: Cardiomegaly with mild vascular congestion.  No  active infiltrates. Suspected right-sided rib fractures, possibly acute. Acute right humeral fracture.   Original Report Authenticated By: Davonna Belling, M.D.    Dg Shoulder Right  06/19/2012  *RADIOLOGY REPORT*  Clinical Data: Fall, pain.  RIGHT SHOULDER - 2+ VIEW  Comparison: Plain films right shoulder 08/23/2011.  Findings: The patient has an acute surgical neck fracture of the right humerus which involves the greater tuberosity.  There is at least one shaft width anterior displacement.  The acromioclavicular joint is intact with some degenerative change noted.  Right clavicle fracture seen on the prior study has healed with one and one half shaft width inferior displacement of the distal fragment.  IMPRESSION:  1.  Anteriorly displaced surgical neck fracture right humerus involves the greater tuberosity. 2.  Interval healing of a right clavicle fracture.   Original Report Authenticated By: Holley Dexter, M.D.    Ct Shoulder Right Wo Contrast  06/21/2012  *RADIOLOGY REPORT*  Clinical Data:  Shoulder fracture.  CT RIGHT SHOULDER WITHOUT CONTRAST  Technique:  Multidetector CT imaging of the right shoulder was performed according to the standard protocol without intravenous contrast. Multiplanar CT image reconstructions were also generated.  Comparison:  Plain film 06/19/2012.  Findings:  Comminuted four part proximal humerus fracture is present.  The comminuted fracture extends into the posterior articular surface of the humeral head.  There is one shaft width anterior displacement of the proximal humeral metaphysis relative to the head and neck.  Transverse surgical neck fracture.  1 cm lateral displacement of the proximal humeral metaphysis relative to the head.  Mild impaction is present.  There is a 2.5 cm dorsal humeral head fracture fragment posterior aspect of the joint.  Large hemarthrosis is present.  Scattered areas of dependent atelectasis are present in the lungs, prominent at the apex.  No  aggressive osseous lesions.  Cervical spondylosis. There is a healed fracture of the right clavicle at the junction of the middle and distal third of the clavicle.  Moderate AC joint osteoarthritis.  Type 2 acromion.  Medial clavicle appears intact. The right sternoclavicular joint appears normal.  No displaced rib fractures are identified.  IMPRESSION: Comminuted and displaced four part proximal right humerus fracture.  *RADIOLOGY REPORT*  3-DIMENSIONAL CT IMAGE RENDERING AT INDEPENDENT WORKSTATION:  Technique:  3-dimensional CT images were rendered by post- processing of the original CT data at independent workstation.  The 3-dimensional CT images were interpreted, and findings were reported in the accompanying complete CT report for this study.  Three dimensional reconstructions were performed by the radiologist at an independent workstation.   Original Report Authenticated By: Andreas Newport, M.D.    Ct 3d Recon At Scanner  06/21/2012  *RADIOLOGY REPORT*  Clinical Data:  Shoulder fracture.  CT RIGHT SHOULDER WITHOUT CONTRAST  Technique:  Multidetector CT imaging of the right shoulder was  performed according to the standard protocol without intravenous contrast. Multiplanar CT image reconstructions were also generated.  Comparison:  Plain film 06/19/2012.  Findings:  Comminuted four part proximal humerus fracture is present.  The comminuted fracture extends into the posterior articular surface of the humeral head.  There is one shaft width anterior displacement of the proximal humeral metaphysis relative to the head and neck.  Transverse surgical neck fracture.  1 cm lateral displacement of the proximal humeral metaphysis relative to the head.  Mild impaction is present.  There is a 2.5 cm dorsal humeral head fracture fragment posterior aspect of the joint.  Large hemarthrosis is present.  Scattered areas of dependent atelectasis are present in the lungs, prominent at the apex.  No aggressive osseous lesions.   Cervical spondylosis. There is a healed fracture of the right clavicle at the junction of the middle and distal third of the clavicle.  Moderate AC joint osteoarthritis.  Type 2 acromion.  Medial clavicle appears intact. The right sternoclavicular joint appears normal.  No displaced rib fractures are identified.  IMPRESSION: Comminuted and displaced four part proximal right humerus fracture.  *RADIOLOGY REPORT*  3-DIMENSIONAL CT IMAGE RENDERING AT INDEPENDENT WORKSTATION:  Technique:  3-dimensional CT images were rendered by post- processing of the original CT data at independent workstation.  The 3-dimensional CT images were interpreted, and findings were reported in the accompanying complete CT report for this study.  Three dimensional reconstructions were performed by the radiologist at an independent workstation.   Original Report Authenticated By: Andreas Newport, M.D.     Scheduled Meds:   . atorvastatin  40 mg Oral q1800  . calcium-vitamin D  1 tablet Oral Daily  . cholecalciferol  1,000 Units Oral Daily  . diltiazem  360 mg Oral Daily  . docusate sodium  100 mg Oral BID  . levothyroxine  100 mcg Oral QAC breakfast  . metoprolol tartrate  25 mg Oral BID  . multivitamin with minerals  1 tablet Oral Daily  . potassium chloride SA  10 mEq Oral Daily  . sertraline  100 mg Oral Daily  . sodium chloride  3 mL Intravenous Q12H  . sodium chloride  3 mL Intravenous Q12H  . vitamin C  500 mg Oral Daily   Continuous Infusions:   Principal Problem:  *Closed fracture of right humerus Active Problems:  ITP (idiopathic thrombocytopenic purpura)  A-fib  HTN (hypertension)  Hypothyroidism    Time spent: > 30 minutes    Penny Pia  [facility] Pager [phone] If 8PM-8AM, please contact night-coverage at [email or web address], password [facility] 06/21/2012, 1:48 PM  LOS: 2 days

## 2012-06-22 DIAGNOSIS — S42201A Unspecified fracture of upper end of right humerus, initial encounter for closed fracture: Secondary | ICD-10-CM | POA: Diagnosis present

## 2012-06-22 LAB — PROTIME-INR: Prothrombin Time: 19.7 seconds — ABNORMAL HIGH (ref 11.6–15.2)

## 2012-06-22 NOTE — Progress Notes (Signed)
[facility] PROGRESS NOTE  Brief History: 79 y/o CF with history of atrial fibrillation on coumadin that presented to the hospital with Right humeral fracture (four part) 2ary to mechanical fall.  Plan is for ORIF and will need transfer to [facility] for further evaluation and recommendations from orthopaedic surgeons.  Hayley [name] KGM:[id number] DOB: 11-Sep-1920 DOA: 06/19/2012 PCP: Gwen Pounds, MD  Assessment/Plan: 1. Closed fracture of right humerus - Per ortho request will transfer patient to [facility] for further treatment evaluation and recommendations. - Per ortho at this juncture - Pain control. Patient mentions that tramadol is too strong and was on dilaudid for pain control. As such have discontinued and will place on tylenol. Oxycodone IR only for breakthrough pain medication.  2. Afib  - rate controlled at this juncture continue current rate controlling medications - agree with hold coumadin as patient may require ORIF - monitor INR levels which are currently trending down from 2.7 yesterday to 1.73  3. Hypothyroidism - TSH wnl at 1.102 - continue synthroid at home dose at this juncture.  4. HTN - exacerbated due to pain from # 1 but relatively well controlled on current regimen - continue to monitor and adjust medications pending further BP values.   Code Status: full Family Communication: Spoke with patient and family member at bedside Disposition Plan: Transfer to [place] today 06/22/12. Pending further recommendations from ortho after they have finished their evaluation and recommendations   Consultants:  Orthopaedic surgeon: Dr. Luiz Blare  Procedures:  Pending further evaluation  Antibiotics:  none  HPI/Subjective: Patient has no new complaints at this juncture. Slept well and pain tolerable on tylenol  Objective: Filed Vitals:   06/21/12 0527 06/21/12 2118 06/22/12 0030 06/22/12 0633  BP: 140/80 148/84  163/91  Pulse: 89 95  107  Temp:  98.2 F (36.8 C) 97.9 F (36.6 C) 97.9 F (36.6 C) 98 F (36.7 C)  TempSrc: Oral Oral Oral Oral  Resp: 18 18  18   Height:      Weight:      SpO2: 96% 94%  96%    Intake/Output Summary (Last 24 hours) at 06/22/12 0910 Last data filed at 06/22/12 0834  Gross per 24 hour  Intake    480 ml  Output   1950 ml  Net  -1470 ml   Filed Weights   06/20/12 0421  Weight: 70.6 kg (155 lb 10.3 oz)    Exam:   General:  Pt in NAD, Alert and Awake  Cardiovascular: irregular, No MRG  Respiratory: CTA BL, no wheezes  Abdomen: soft, NT, ND  Extremities: RUE in sling  Data Reviewed: Basic Metabolic Panel:  Lab 06/19/12 6440  NA 135  K 3.9  CL 99  CO2 27  GLUCOSE 178*  BUN 15  CREATININE 0.62  CALCIUM 9.0  MG --  PHOS --   Liver Function Tests: No results found for this basename: AST:5,ALT:5,ALKPHOS:5,BILITOT:5,PROT:5,ALBUMIN:5 in the last 168 hours No results found for this basename: LIPASE:5,AMYLASE:5 in the last 168 hours No results found for this basename: AMMONIA:5 in the last 168 hours CBC:  Lab 06/19/12 2115  WBC 10.2  NEUTROABS 7.7  HGB 12.2  HCT 36.8  MCV 88.7  PLT 134*   Cardiac Enzymes: No results found for this basename: CKTOTAL:5,CKMB:5,CKMBINDEX:5,TROPONINI:5 in the last 168 hours BNP (last 3 results) No results found for this basename: PROBNP:3 in the last 8760 hours CBG: No results found for this basename: GLUCAP:5 in the last 168 hours  Recent Results (from the past 240 hour(s))  URINE CULTURE     Status: Normal   Collection Time   06/20/12 12:20 AM      Component Value Range Status Comment   Specimen Description URINE, CATHETERIZED   Final    Special Requests NONE   Final    Culture  Setup Time 06/20/2012 03:43   Final    Colony Count NO GROWTH   Final    Culture NO GROWTH   Final    Report Status 06/21/2012 FINAL   Final      Studies: Ct Shoulder Right Wo Contrast  06/21/2012  *RADIOLOGY REPORT*  Clinical Data:  Shoulder fracture.  CT  RIGHT SHOULDER WITHOUT CONTRAST  Technique:  Multidetector CT imaging of the right shoulder was performed according to the standard protocol without intravenous contrast. Multiplanar CT image reconstructions were also generated.  Comparison:  Plain film 06/19/2012.  Findings:  Comminuted four part proximal humerus fracture is present.  The comminuted fracture extends into the posterior articular surface of the humeral head.  There is one shaft width anterior displacement of the proximal humeral metaphysis relative to the head and neck.  Transverse surgical neck fracture.  1 cm lateral displacement of the proximal humeral metaphysis relative to the head.  Mild impaction is present.  There is a 2.5 cm dorsal humeral head fracture fragment posterior aspect of the joint.  Large hemarthrosis is present.  Scattered areas of dependent atelectasis are present in the lungs, prominent at the apex.  No aggressive osseous lesions.  Cervical spondylosis. There is a healed fracture of the right clavicle at the junction of the middle and distal third of the clavicle.  Moderate AC joint osteoarthritis.  Type 2 acromion.  Medial clavicle appears intact. The right sternoclavicular joint appears normal.  No displaced rib fractures are identified.  IMPRESSION: Comminuted and displaced four part proximal right humerus fracture.  *RADIOLOGY REPORT*  3-DIMENSIONAL CT IMAGE RENDERING AT INDEPENDENT WORKSTATION:  Technique:  3-dimensional CT images were rendered by post- processing of the original CT data at independent workstation.  The 3-dimensional CT images were interpreted, and findings were reported in the accompanying complete CT report for this study.  Three dimensional reconstructions were performed by the radiologist at an independent workstation.   Original Report Authenticated By: Andreas Newport, M.D.    Ct 3d Recon At Scanner  06/21/2012  *RADIOLOGY REPORT*  Clinical Data:  Shoulder fracture.  CT RIGHT SHOULDER WITHOUT  CONTRAST  Technique:  Multidetector CT imaging of the right shoulder was performed according to the standard protocol without intravenous contrast. Multiplanar CT image reconstructions were also generated.  Comparison:  Plain film 06/19/2012.  Findings:  Comminuted four part proximal humerus fracture is present.  The comminuted fracture extends into the posterior articular surface of the humeral head.  There is one shaft width anterior displacement of the proximal humeral metaphysis relative to the head and neck.  Transverse surgical neck fracture.  1 cm lateral displacement of the proximal humeral metaphysis relative to the head.  Mild impaction is present.  There is a 2.5 cm dorsal humeral head fracture fragment posterior aspect of the joint.  Large hemarthrosis is present.  Scattered areas of dependent atelectasis are present in the lungs, prominent at the apex.  No aggressive osseous lesions.  Cervical spondylosis. There is a healed fracture of the right clavicle at the junction of the middle and distal third of the clavicle.  Moderate AC joint osteoarthritis.  Type 2 acromion.  Medial clavicle appears intact. The right sternoclavicular joint appears normal.  No displaced rib fractures are identified.  IMPRESSION: Comminuted and displaced four part proximal right humerus fracture.  *RADIOLOGY REPORT*  3-DIMENSIONAL CT IMAGE RENDERING AT INDEPENDENT WORKSTATION:  Technique:  3-dimensional CT images were rendered by post- processing of the original CT data at independent workstation.  The 3-dimensional CT images were interpreted, and findings were reported in the accompanying complete CT report for this study.  Three dimensional reconstructions were performed by the radiologist at an independent workstation.   Original Report Authenticated By: Andreas Newport, M.D.     Scheduled Meds:    . atorvastatin  40 mg Oral q1800  . calcium-vitamin D  1 tablet Oral Daily  . cholecalciferol  1,000 Units Oral Daily  .  diltiazem  360 mg Oral Daily  . docusate sodium  100 mg Oral BID  . levothyroxine  100 mcg Oral QAC breakfast  . metoprolol tartrate  25 mg Oral BID  . multivitamin with minerals  1 tablet Oral Daily  . potassium chloride SA  10 mEq Oral Daily  . sertraline  100 mg Oral Daily  . sodium chloride  3 mL Intravenous Q12H  . sodium chloride  3 mL Intravenous Q12H  . vitamin C  500 mg Oral Daily   Continuous Infusions:   Principal Problem:  *Closed fracture of right humerus Active Problems:  ITP (idiopathic thrombocytopenic purpura)  A-fib  HTN (hypertension)  Hypothyroidism    Time spent: > 30 minutes    Penny Pia  [facility] Pager [phone] If 8PM-8AM, please contact night-coverage at [email or web address], password [facility] 06/22/2012, 9:10 AM  LOS: 3 days

## 2012-06-22 NOTE — Progress Notes (Signed)
Report called to [facility], [facility] aware of transfer

## 2012-06-23 ENCOUNTER — Encounter (HOSPITAL_COMMUNITY)
Admission: EM | Disposition: A | Discharge status: Home Health | Payer: Self-pay | Source: Home / Self Care | Attending: Family Medicine

## 2012-06-23 ENCOUNTER — Inpatient Hospital Stay (HOSPITAL_COMMUNITY): Payer: Medicare Other | Admitting: Anesthesiology

## 2012-06-23 ENCOUNTER — Encounter (HOSPITAL_COMMUNITY): Payer: Self-pay | Admitting: Anesthesiology

## 2012-06-23 ENCOUNTER — Inpatient Hospital Stay (HOSPITAL_COMMUNITY): Payer: Medicare Other

## 2012-06-23 HISTORY — PX: SHOULDER HEMI-ARTHROPLASTY: SHX5049

## 2012-06-23 LAB — BASIC METABOLIC PANEL
CO2: 27 mEq/L (ref 19–32)
Calcium: 8.8 mg/dL (ref 8.4–10.5)
Chloride: 97 mEq/L (ref 96–112)
Creatinine, Ser: 0.46 mg/dL — ABNORMAL LOW (ref 0.50–1.10)
Glucose, Bld: 148 mg/dL — ABNORMAL HIGH (ref 70–99)
Sodium: 134 mEq/L — ABNORMAL LOW (ref 135–145)

## 2012-06-23 LAB — SURGICAL PCR SCREEN: Staphylococcus aureus: NEGATIVE

## 2012-06-23 SURGERY — HEMIARTHROPLASTY, SHOULDER
Anesthesia: Regional | Site: Shoulder | Laterality: Right | Wound class: Clean

## 2012-06-23 MED ORDER — LIDOCAINE HCL (CARDIAC) 20 MG/ML IV SOLN
INTRAVENOUS | Status: DC | PRN
  Administered 2012-06-23: 100 mg via INTRAVENOUS

## 2012-06-23 MED ORDER — ONDANSETRON HCL 4 MG/2ML IJ SOLN
INTRAMUSCULAR | Status: DC | PRN
  Administered 2012-06-23: 4 mg via INTRAVENOUS

## 2012-06-23 MED ORDER — LACTATED RINGERS IV SOLN
INTRAVENOUS | Status: DC
  Administered 2012-06-23: 15:00:00 via INTRAVENOUS

## 2012-06-23 MED ORDER — NEOSTIGMINE METHYLSULFATE 1 MG/ML IJ SOLN
INTRAMUSCULAR | Status: DC | PRN
  Administered 2012-06-23: 3 mg via INTRAVENOUS

## 2012-06-23 MED ORDER — CEFAZOLIN SODIUM-DEXTROSE 2-3 GM-% IV SOLR
2.0000 g | Freq: Four times a day (QID) | INTRAVENOUS | Status: AC
  Administered 2012-06-24 (×3): 2 g via INTRAVENOUS
  Filled 2012-06-23 (×4): qty 50

## 2012-06-23 MED ORDER — ROCURONIUM BROMIDE 100 MG/10ML IV SOLN
INTRAVENOUS | Status: DC | PRN
  Administered 2012-06-23: 50 mg via INTRAVENOUS

## 2012-06-23 MED ORDER — MEPERIDINE HCL 25 MG/ML IJ SOLN: 6.2500 mg | INTRAMUSCULAR | Status: DC | PRN

## 2012-06-23 MED ORDER — ACETAMINOPHEN 10 MG/ML IV SOLN
1000.0000 mg | Freq: Once | INTRAVENOUS | Status: AC | PRN
  Administered 2012-06-23: 1000 mg via INTRAVENOUS
  Filled 2012-06-23: qty 100

## 2012-06-23 MED ORDER — PROPOFOL 10 MG/ML IV BOLUS
INTRAVENOUS | Status: DC | PRN
  Administered 2012-06-23: 150 mg via INTRAVENOUS

## 2012-06-23 MED ORDER — PHENYLEPHRINE HCL 10 MG/ML IJ SOLN
10.0000 mg | INTRAVENOUS | Status: DC | PRN
  Administered 2012-06-23: 50 ug/min via INTRAVENOUS

## 2012-06-23 MED ORDER — FENTANYL CITRATE 0.05 MG/ML IJ SOLN
25.0000 ug | INTRAMUSCULAR | Status: DC | PRN
  Administered 2012-06-23 (×3): 50 ug via INTRAVENOUS

## 2012-06-23 MED ORDER — PHENYLEPHRINE HCL 10 MG/ML IJ SOLN
INTRAMUSCULAR | Status: DC | PRN
  Administered 2012-06-23: 40 ug via INTRAVENOUS
  Administered 2012-06-23 (×3): 120 ug via INTRAVENOUS

## 2012-06-23 MED ORDER — FENTANYL CITRATE 0.05 MG/ML IJ SOLN
INTRAMUSCULAR | Status: DC | PRN
  Administered 2012-06-23: 50 ug via INTRAVENOUS

## 2012-06-23 MED ORDER — ACETAMINOPHEN 10 MG/ML IV SOLN
INTRAVENOUS | Status: AC
  Administered 2012-06-23: 1000 mg via INTRAVENOUS
  Filled 2012-06-23: qty 100

## 2012-06-23 MED ORDER — SODIUM CHLORIDE 0.9 % IR SOLN
Status: DC | PRN
  Administered 2012-06-23: 1000 mL
  Administered 2012-06-23: 3000 mL

## 2012-06-23 MED ORDER — GLYCOPYRROLATE 0.2 MG/ML IJ SOLN
INTRAMUSCULAR | Status: DC | PRN
  Administered 2012-06-23: 0.4 mg via INTRAVENOUS

## 2012-06-23 MED ORDER — MIDAZOLAM HCL 2 MG/2ML IJ SOLN
1.0000 mg | Freq: Once | INTRAMUSCULAR | Status: AC
  Administered 2012-06-23: 1 mg via INTRAVENOUS

## 2012-06-23 MED ORDER — CEFAZOLIN SODIUM-DEXTROSE 2-3 GM-% IV SOLR
INTRAVENOUS | Status: AC
  Administered 2012-06-23: 2 g via INTRAVENOUS
  Filled 2012-06-23: qty 50

## 2012-06-23 MED ORDER — SODIUM CHLORIDE 0.45 % IV SOLN
INTRAVENOUS | Status: DC
  Administered 2012-06-23: 10:00:00 via INTRAVENOUS

## 2012-06-23 MED ORDER — PROMETHAZINE HCL 25 MG/ML IJ SOLN: 6.2500 mg | INTRAMUSCULAR | Status: DC | PRN

## 2012-06-23 MED ORDER — LACTATED RINGERS IV SOLN
INTRAVENOUS | Status: DC | PRN
  Administered 2012-06-23: 15:00:00 via INTRAVENOUS

## 2012-06-23 MED ORDER — FENTANYL CITRATE 0.05 MG/ML IJ SOLN
INTRAMUSCULAR | Status: AC
  Administered 2012-06-23: 50 ug via INTRAVENOUS
  Filled 2012-06-23: qty 2

## 2012-06-23 SURGICAL SUPPLY — 80 items
BENZOIN TINCTURE PRP APPL 2/3 (GAUZE/BANDAGES/DRESSINGS) ×2 IMPLANT
BLADE SAW SAG 29X58X.64 (BLADE) ×2 IMPLANT
BLADE SURG ROTATE 9660 (MISCELLANEOUS) IMPLANT
BOWL SMART MIX CTS (DISPOSABLE) ×2 IMPLANT
CEMENT BONE DEPUY (Cement) ×4 IMPLANT
CLOTH BEACON ORANGE TIMEOUT ST (SAFETY) ×2 IMPLANT
CLSR STERI-STRIP ANTIMIC 1/2X4 (GAUZE/BANDAGES/DRESSINGS) ×2 IMPLANT
COVER SURGICAL LIGHT HANDLE (MISCELLANEOUS) ×2 IMPLANT
COVER TABLE BACK 60X90 (DRAPES) IMPLANT
DRAPE C-ARM 42X72 X-RAY (DRAPES) IMPLANT
DRAPE INCISE IOBAN 66X45 STRL (DRAPES) ×2 IMPLANT
DRAPE PROXIMA HALF (DRAPES) IMPLANT
DRAPE U-SHAPE 47X51 STRL (DRAPES) ×4 IMPLANT
DRILL BIT 7/64X5 (BIT) ×2 IMPLANT
DRSG MEPILEX BORDER 4X8 (GAUZE/BANDAGES/DRESSINGS) ×2 IMPLANT
DRSG PAD ABDOMINAL 8X10 ST (GAUZE/BANDAGES/DRESSINGS) ×2 IMPLANT
DURAPREP 26ML APPLICATOR (WOUND CARE) ×2 IMPLANT
ELECT CAUTERY BLADE 6.4 (BLADE) ×2 IMPLANT
ELECT NEEDLE TIP 2.8 STRL (NEEDLE) ×2 IMPLANT
ELECT REM PT RETURN 9FT ADLT (ELECTROSURGICAL) ×2
ELECTRODE REM PT RTRN 9FT ADLT (ELECTROSURGICAL) ×1 IMPLANT
EVACUATOR 1/8 PVC DRAIN (DRAIN) IMPLANT
GAUZE XEROFORM 5X9 LF (GAUZE/BANDAGES/DRESSINGS) ×2 IMPLANT
GLOVE BIOGEL PI IND STRL 7.0 (GLOVE) ×1 IMPLANT
GLOVE BIOGEL PI IND STRL 8 (GLOVE) ×2 IMPLANT
GLOVE BIOGEL PI INDICATOR 7.0 (GLOVE) ×1
GLOVE BIOGEL PI INDICATOR 8 (GLOVE) ×2
GLOVE BIOGEL PI ORTHO PRO 7.5 (GLOVE) ×1
GLOVE ECLIPSE 7.5 STRL STRAW (GLOVE) ×4 IMPLANT
GLOVE PI ORTHO PRO STRL 7.5 (GLOVE) ×1 IMPLANT
GLOVE SURG SS PI 6.5 STRL IVOR (GLOVE) ×2 IMPLANT
GLOVE SURG SS PI 7.0 STRL IVOR (GLOVE) ×4 IMPLANT
GOWN PREVENTION PLUS XLARGE (GOWN DISPOSABLE) IMPLANT
GOWN SRG XL XLNG 56XLVL 4 (GOWN DISPOSABLE) ×4 IMPLANT
GOWN STRL NON-REIN LRG LVL3 (GOWN DISPOSABLE) IMPLANT
GOWN STRL NON-REIN XL XLG LVL4 (GOWN DISPOSABLE) ×4
HANDPIECE INTERPULSE COAX TIP (DISPOSABLE) ×1
HOOD PEEL AWAY FACE SHEILD DIS (HOOD) ×2 IMPLANT
KIT BASIN OR (CUSTOM PROCEDURE TRAY) ×2 IMPLANT
KIT ROOM TURNOVER OR (KITS) ×2 IMPLANT
MANIFOLD NEPTUNE II (INSTRUMENTS) ×2 IMPLANT
NDL SUT 2 .5 CRC MAYO 1.732X (NEEDLE) ×1 IMPLANT
NEEDLE 22X1 1/2 (OR ONLY) (NEEDLE) IMPLANT
NEEDLE MAYO TAPER (NEEDLE) ×1
NOZZLE PRISM 8.5MM (MISCELLANEOUS) ×2 IMPLANT
NS IRRIG 1000ML POUR BTL (IV SOLUTION) ×2 IMPLANT
PACK SHOULDER (CUSTOM PROCEDURE TRAY) ×2 IMPLANT
PAD ARMBOARD 7.5X6 YLW CONV (MISCELLANEOUS) ×2 IMPLANT
PASSER SUT SWANSON 36MM LOOP (INSTRUMENTS) ×2 IMPLANT
PRESSURIZER FEMORAL UNIV (MISCELLANEOUS) ×2 IMPLANT
SET HNDPC FAN SPRY TIP SCT (DISPOSABLE) ×1 IMPLANT
SLING ARM IMMOBILIZER LRG (SOFTGOODS) ×2 IMPLANT
SLING ARM IMMOBILIZER MED (SOFTGOODS) IMPLANT
SPONGE GAUZE 4X4 12PLY (GAUZE/BANDAGES/DRESSINGS) ×2 IMPLANT
SPONGE LAP 18X18 X RAY DECT (DISPOSABLE) ×2 IMPLANT
SPONGE LAP 4X18 X RAY DECT (DISPOSABLE) IMPLANT
STAPLER VISISTAT 35W (STAPLE) IMPLANT
STEM STANDARD SZ 10 113MM (Stem) ×2 IMPLANT
STEM STD SZ 10 113MM (Stem) ×1 IMPLANT
SUCTION FRAZIER TIP 10 FR DISP (SUCTIONS) ×2 IMPLANT
SUT ETHIBOND 2 V 37 (SUTURE) IMPLANT
SUT ETHIBOND NAB CT1 #1 30IN (SUTURE) IMPLANT
SUT FIBERWIRE #2 38 T-5 BLUE (SUTURE) ×12
SUT SILK 2 0 (SUTURE)
SUT SILK 2-0 18XBRD TIE 12 (SUTURE) IMPLANT
SUT VIC AB 0 CT1 27 (SUTURE) ×1
SUT VIC AB 0 CT1 27XBRD ANBCTR (SUTURE) ×1 IMPLANT
SUT VIC AB 2-0 CT1 27 (SUTURE) ×1
SUT VIC AB 2-0 CT1 TAPERPNT 27 (SUTURE) ×1 IMPLANT
SUT VICRYL 0 TIES 12 18 (SUTURE) IMPLANT
SUTURE FIBERWR #2 38 T-5 BLUE (SUTURE) ×6 IMPLANT
SYR CONTROL 10ML LL (SYRINGE) ×2 IMPLANT
TOWEL OR 17X24 6PK STRL BLUE (TOWEL DISPOSABLE) ×2 IMPLANT
TOWEL OR 17X26 10 PK STRL BLUE (TOWEL DISPOSABLE) ×2 IMPLANT
TOWER CARTRIDGE SMART MIX (DISPOSABLE) IMPLANT
TRAY FOLEY CATH 14FR (SET/KITS/TRAYS/PACK) ×2 IMPLANT
WATER STERILE IRR 1000ML POUR (IV SOLUTION) ×2 IMPLANT
YANKAUER SUCT BULB TIP NO VENT (SUCTIONS) ×2 IMPLANT
global unite epiphysis body (Orthopedic Implant) ×2 IMPLANT
global unite standard humeral head (Orthopedic Implant) ×2 IMPLANT

## 2012-06-23 NOTE — Brief Op Note (Signed)
06/19/2012 - 06/23/2012  5:39 PM  PATIENT:  Hayley [name]  79 y.o. female  PRE-OPERATIVE DIAGNOSIS:  shoulder fracture, right  POST-OPERATIVE DIAGNOSIS:  shoulder fracture, right  PROCEDURE:  Procedure(s) (LRB) with comments: SHOULDER HEMI-ARTHROPLASTY (Right)  SURGEON:  Surgeon(s) and Role:    * Harvie Junior, MD - Primary  PHYSICIAN ASSISTANT:   ASSISTANTS: bethune   ANESTHESIA:   general  EBL:  Total I/O In: 1000 [I.V.:1000] Out: 300 [Urine:300]  BLOOD ADMINISTERED:none  DRAINS: none   LOCAL MEDICATIONS USED:  NONE  SPECIMEN:  No Specimen  DISPOSITION OF SPECIMEN:  N/A  COUNTS:  YES  TOURNIQUET:  * No tourniquets in log *  DICTATION: .Other Dictation: Dictation Number [id number]  PLAN OF CARE: Admit to inpatient   PATIENT DISPOSITION:  PACU - hemodynamically stable.   Delay start of Pharmacological VTE agent (>24hrs) due to surgical blood loss or risk of bleeding: no

## 2012-06-23 NOTE — Transfer of Care (Signed)
Immediate Anesthesia Transfer of Care Note  Patient: Hayley [name]  Procedure(s) Performed: Procedure(s) (LRB) with comments: SHOULDER HEMI-ARTHROPLASTY (Right)  Patient Location: PACU  Anesthesia Type:General  Level of Consciousness: awake and alert   Airway & Oxygen Therapy: Patient Spontanous Breathing and Patient connected to face mask oxygen  Post-op Assessment: Report given to PACU RN, Post -op Vital signs reviewed and stable and Patient moving all extremities X 4  Post vital signs: Reviewed and stable  Complications: No apparent anesthesia complications

## 2012-06-23 NOTE — Progress Notes (Signed)
Patient noted to have IV site at the left antecubital area that had become red around the insertion site.  No complaints of pain, no drainage noted.  Area cleansed and bandage applied.  Nursing will continue to monitor.

## 2012-06-23 NOTE — Anesthesia Postprocedure Evaluation (Signed)
Anesthesia Post Note  Patient: Hayley [name]  Procedure(s) Performed: Procedure(s) (LRB): SHOULDER HEMI-ARTHROPLASTY (Right)  Anesthesia type: general  Patient location: PACU  Post pain: Pain level controlled  Post assessment: Patient's Cardiovascular Status Stable  Last Vitals:  Filed Vitals:   06/23/12 1900  BP: 130/73  Pulse: 96  Temp:   Resp: 14    Post vital signs: Reviewed and stable  Level of consciousness: sedated  Complications: No apparent anesthesia complications

## 2012-06-23 NOTE — Anesthesia Preprocedure Evaluation (Addendum)
Anesthesia Evaluation  Patient identified by MRN, date of birth, ID band Patient awake    Reviewed: Allergy & Precautions, H&P , NPO status , Patient's Chart, lab work & pertinent test results, reviewed documented beta blocker date and time   Airway Mallampati: II TM Distance: >3 FB Neck ROM: Full    Dental  (+) Dental Advisory Given and Teeth Intact   Pulmonary neg pulmonary ROS,  breath sounds clear to auscultation  Pulmonary exam normal       Cardiovascular hypertension, Pt. on medications and Pt. on home beta blockers + dysrhythmias Atrial Fibrillation Rhythm:Irregular Rate:Normal     Neuro/Psych negative neurological ROS  negative psych ROS   GI/Hepatic negative GI ROS, Neg liver ROS,   Endo/Other  Hypothyroidism   Renal/GU negative Renal ROS     Musculoskeletal  (+) Arthritis -, Osteoarthritis,    Abdominal (+) - obese,   Peds  Hematology  (+) Blood dyscrasia, , Chronic ITP   Anesthesia Other Findings   Reproductive/Obstetrics                        Anesthesia Physical Anesthesia Plan  ASA: III  Anesthesia Plan: General and Regional   Post-op Pain Management:    Induction: Intravenous  Airway Management Planned: Oral ETT  Additional Equipment:   Intra-op Plan:   Post-operative Plan: Extubation in OR  Informed Consent: I have reviewed the patients History and Physical, chart, labs and discussed the procedure including the risks, benefits and alternatives for the proposed anesthesia with the patient or authorized representative who has indicated his/her understanding and acceptance.   Dental advisory given  Plan Discussed with: CRNA  Anesthesia Plan Comments:         Anesthesia Quick Evaluation

## 2012-06-23 NOTE — Preoperative (Signed)
Beta Blockers   Reason not to administer Beta Blockers:Not Applicable 

## 2012-06-23 NOTE — Progress Notes (Signed)
Subjective: Pt c/o pain r shoulder   Objective: Vital signs in last 24 hours: Temp:  [97.8 F (36.6 C)-99.2 F (37.3 C)] 97.8 F (36.6 C) (01/13 1610) Pulse Rate:  [82-96] 82  (01/13 0652) Resp:  [18] 18  (01/13 0652) BP: (122-140)/(62-73) 124/69 mmHg (01/13 0652) SpO2:  [96 %-99 %] 99 % (01/13 0652)  Intake/Output from previous day: 01/12 0701 - 01/13 0700 In: 483 [P.O.:480; I.V.:3] Out: 900 [Urine:900] Intake/Output this shift:    No results found for this basename: HGB:5 in the last 72 hours No results found for this basename: WBC:2,RBC:2,HCT:2,PLT:2 in the last 72 hours No results found for this basename: NA:2,K:2,CL:2,CO2:2,BUN:2,CREATININE:2,GLUCOSE:2,CALCIUM:2 in the last 72 hours  Basename 06/22/12 0509 06/21/12 0814  LABPT -- --  INR 1.73* 2.77*    Neurologically intact Intact pulses distally No cellulitis present Compartment soft  Assessment/Plan: 4 part prox humerus fracture with head split// plan hemi shoulder today   Darice Vicario L 06/23/2012, 8:31 AM

## 2012-06-23 NOTE — Progress Notes (Signed)
PT Cancellation Note  Patient Details Name: Hayley [name] MRN: [id number] DOB: April 08, 1921   Cancelled Treatment:    Reason Eval/Treat Not Completed: Patient at procedure or test/unavailable;Other (comment) (Patient going down for hemi-shoulder replacement)   Fredrich Birks 06/23/2012, 11:38 AM 06/23/2012 Fredrich Birks PTA [phone] pager [phone] office

## 2012-06-23 NOTE — Anesthesia Procedure Notes (Addendum)
Anesthesia Regional Block:  Interscalene brachial plexus block  Pre-Anesthetic Checklist: ,, timeout performed, Correct Patient, Correct Site, Correct Laterality, Correct Procedure, Correct Position, site marked, Risks and benefits discussed,  Surgical consent,  Pre-op evaluation,  At surgeon's request and post-op pain management  Laterality: Right  Prep: chloraprep       Needles:  Injection technique: Single-shot  Needle Type: Stimiplex     Needle Length: 10cm 10 cm Needle Gauge: 20 and 20 G    Additional Needles:  Procedures: ultrasound guided (picture in chart) and nerve stimulator  Motor weakness within 10 minutes. Interscalene brachial plexus block  Nerve Stimulator or Paresthesia:  Response: Biocepts, 0.6 mA,   Additional Responses:   Narrative:  Start time: 06/23/2012 2:31 PM End time: 06/23/2012 2:40 PM Injection made incrementally with aspirations every 5 mL.  Performed by: Personally  Anesthesiologist: Germeroth  Additional Notes: Patient tolerated the procedure well without complications  Interscalene brachial plexus block Procedure Name: Intubation Date/Time: 06/23/2012 3:05 PM Performed by: Jerilee Hoh Pre-anesthesia Checklist: Patient identified, Emergency Drugs available, Suction available and Patient being monitored Patient Re-evaluated:Patient Re-evaluated prior to inductionOxygen Delivery Method: Circle system utilized Preoxygenation: Pre-oxygenation with 100% oxygen Intubation Type: IV induction Ventilation: Mask ventilation without difficulty Laryngoscope Size: Mac and 3 Grade View: Grade II Tube type: Oral Tube size: 7.5 mm Number of attempts: 1 Airway Equipment and Method: Stylet Placement Confirmation: ETT inserted through vocal cords under direct vision,  positive ETCO2 and breath sounds checked- equal and bilateral Secured at: 21 cm Tube secured with: Tape Dental Injury: Teeth and Oropharynx as per pre-operative assessment

## 2012-06-23 NOTE — Progress Notes (Signed)
PROGRESS NOTE  Hayley [name] ZOX:[id number] DOB: Nov 09, 1920 DOA: 06/19/2012 PCP: Gwen Pounds, MD  Brief narrative: 79 yr old admitted 1.10.14 with fall and shoulder fracutre at [facility] hosp and transferred to [facility] 1.12.14   Past medical history-As per Problem list Chart reviewed as below-  Admission 12/08/10 c syncope and A fib c RVR by [facility]  She sees Dr. 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 No active infiltrates. Suspected right-sided rib fractures, possibly acute. Acute right humeral fracture.   R shoulder XrayAnteriorly displaced surgical neck fracture right humerus involves the greater tuberosity. 2. Interval healing of a right clavicle fracture  Ct scan 1/11= Comminuted and displaced four part proximal right humerus fracture.  Antibiotics:   none currently   Subjective  Her pleasant oriented. Sitting on the potty when I saw patient. No other concerns. Pain seems well controlled given immobilizer in place Chest pain nausea vomiting shortness of breath   Objective    Interim History: Nothing significant  Telemetry: Seems to be in A. fib by exam  Objective: Filed Vitals:   06/22/12 2239 06/22/12 2358 06/23/12 0652 06/23/12 1005  BP: 140/62 122/67 124/69 149/75  Pulse: 92 86 82 99  Temp: 97.9 F (36.6 C)  97.8 F (36.6 C)   TempSrc:      Resp: 18  18   Height:      Weight:      SpO2: 99%  99%     Intake/Output Summary (Last 24 hours) at 06/23/12 1134 Last data filed at 06/22/12 2200  Gross per 24 hour  Intake    243 ml  Output    400 ml  Net   -157 ml    Exam:  General: Alert pleasant oriented, sling on the right shoulder. No pallor or icterus Cardiovascular: S1-S2 regular rate rhythm Respiratory: Clinically clear Abdomen: Soft Skin lower extremity edema  Data Reviewed: Basic Metabolic Panel:  Lab 06/23/12 8119  06/19/12 2115  NA 134* 135  K 3.7 3.9  CL 97 99  CO2 27 27  GLUCOSE 148* 178*  BUN 9 15  CREATININE 0.46* 0.62  CALCIUM 8.8 9.0  MG -- --  PHOS -- --   Liver Function Tests: No results found for this basename: AST:5,ALT:5,ALKPHOS:5,BILITOT:5,PROT:5,ALBUMIN:5 in the last 168 hours No results found for this basename: LIPASE:5,AMYLASE:5 in the last 168 hours No results found for this basename: AMMONIA:5 in the last 168 hours CBC:  Lab 06/19/12 2115  WBC 10.2  NEUTROABS 7.7  HGB 12.2  HCT 36.8  MCV 88.7  PLT 134*   Cardiac Enzymes: No results found for this basename: CKTOTAL:5,CKMB:5,CKMBINDEX:5,TROPONINI:5 in the last 168 hours BNP: No components found with this basename: POCBNP:5 CBG: No results found for this basename: GLUCAP:5 in the last 168 hours  Recent Results (from the past 240 hour(s))  URINE CULTURE     Status: Normal   Collection Time   06/20/12 12:20 AM      Component Value Range Status Comment   Specimen Description URINE, CATHETERIZED   Final    Special Requests NONE   Final    Culture  Setup Time 06/20/2012 03:43   Final    Colony Count NO GROWTH   Final    Culture NO GROWTH   Final    Report Status 06/21/2012 FINAL   Final   SURGICAL PCR SCREEN     Status: Normal  Collection Time   06/23/12 12:10 AM      Component Value Range Status Comment   MRSA, PCR NEGATIVE  NEGATIVE Final    Staphylococcus aureus NEGATIVE  NEGATIVE Final      Studies:              All Imaging reviewed and is as per above notation   Scheduled Meds:   . atorvastatin  40 mg Oral q1800  . calcium-vitamin D  1 tablet Oral Daily  . cholecalciferol  1,000 Units Oral Daily  . diltiazem  360 mg Oral Daily  . docusate sodium  100 mg Oral BID  . levothyroxine  100 mcg Oral QAC breakfast  . metoprolol tartrate  25 mg Oral BID  . multivitamin with minerals  1 tablet Oral Daily  . potassium chloride SA  10 mEq Oral Daily  . sertraline  100 mg Oral Daily  . sodium chloride  3 mL  Intravenous Q12H  . sodium chloride  3 mL Intravenous Q12H  . vitamin C  500 mg Oral Daily   Continuous Infusions:   . sodium chloride 75 mL/hr at 06/23/12 1001     Assessment/PlanD 1. R shoulder fracture-per Dr. Luiz Blare, NPO for surgery today.  INR 1.73, Hb 12.2 and acceptable-post-op pain mangement and DVT management as per orthopedics-PT/Ot ordered on d/c-get outpatient vitamin D level in one to 2 months-continue cholecalciferol 1000 units q. Friday, Os-Cal 1 tablet daily 2. Atrial fibrillation, [place] Vasc score~3-patient should followup as an outpatient with [facility] to determine if patient will be a long-term candidate for Coumadin. In the interim she will continue Coumadin. 3. Hypertension-moderately controlled. Continue Cardizem to 60 mg daily, metoprolol 25 twice a day. 4. Hypothyroidism-continue levothyroxine 100 mcg daily 5. Hypokalemia-continue potassium 10 mEq by mouth daily 6. ?DM-will cover with insulin if her glucose goes above 200 and get an A1c at that time 7. Depression continue sertraline 100 daily, to take alprazolam 0.25 3 times a day when necessary 8. Constipation continue Colace 100 twice a day  Code Status: Full Family Communication: Discussed with daughter at bedside Disposition Plan: Inpatient-PT consult pending given surgeries going to be done today   Pleas Koch, MD  [facility] Pager [phone] 06/23/2012, 11:34 AM    LOS: 4 days

## 2012-06-23 NOTE — Progress Notes (Signed)
Utilization review completed. Shy Guallpa, RN, BSN. 

## 2012-06-24 ENCOUNTER — Encounter (HOSPITAL_COMMUNITY): Payer: Self-pay | Admitting: Orthopedic Surgery

## 2012-06-24 LAB — PROTIME-INR: INR: 1.25 (ref 0.00–1.49)

## 2012-06-24 MED ORDER — WARFARIN SODIUM 5 MG PO TABS
5.0000 mg | ORAL_TABLET | ORAL | Status: DC
  Filled 2012-06-24: qty 1

## 2012-06-24 MED ORDER — WARFARIN SODIUM 2.5 MG PO TABS: 2.5000 mg | ORAL_TABLET | Freq: Every day | ORAL | Status: DC

## 2012-06-24 MED ORDER — WARFARIN SODIUM 2.5 MG PO TABS: 2.5000 mg | ORAL_TABLET | ORAL | Status: DC

## 2012-06-24 MED ORDER — WARFARIN - PHYSICIAN DOSING INPATIENT: Freq: Every day | Status: DC

## 2012-06-24 MED ORDER — ASPIRIN 325 MG PO TABS
325.0000 mg | ORAL_TABLET | Freq: Every day | ORAL | Status: DC
  Administered 2012-06-24 – 2012-06-26 (×3): 325 mg via ORAL
  Filled 2012-06-24 (×3): qty 1

## 2012-06-24 MED FILL — Fentanyl Citrate Inj 0.05 MG/ML: INTRAMUSCULAR | Qty: 2 | Status: AC

## 2012-06-24 NOTE — Progress Notes (Signed)
Rehabilitation consult was requested. Patient's chart has been reviewed status post right shoulder hemiarthroplasty. Patient does not meet medical necessity to require inpatient rehabilitation services. All issues in regards to this were discussed with patient and daughter. They have requested [facility] for discharge. Will hold on formal rehabilitation consult and recommend skilled nursing facility

## 2012-06-24 NOTE — Progress Notes (Signed)
PROGRESS NOTE  Hayley [name] JXB:[id number] DOB: 1921-05-25 DOA: 06/19/2012 PCP: Gwen Pounds, MD  Brief narrative: 79 yr old admitted 1.10.14 with fall and shoulder fracutre at [facility] hosp and transferred to [facility] 1.12.14\-she underwent a Shoulder hemi-arthroplasty.     Past medical history-As per Problem list Chart reviewed as below-  Admission 12/08/10 c syncope and A fib c RVR by [facility]  She sees Dr. 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 No active infiltrates. Suspected right-sided rib fractures, possibly acute. Acute right humeral fracture.   R shoulder XrayAnteriorly displaced surgical neck fracture right humerus involves the greater tuberosity. 2. Interval healing of a right clavicle fracture  Ct scan 1/11= Comminuted and displaced four part proximal right humerus fracture.  Antibiotics:   none currently   Subjective  Feels well.  Complaining about amount of pills she has to take. Pain seems controlled currently-about a 4 No CP Feels some palpitations Has been up only a little today.   Objective    Interim History: Nothing significant  Telemetry: Seems to be in A. fib by exam  Objective: Filed Vitals:   06/23/12 1945 06/23/12 2000 06/23/12 2015 06/24/12 0622  BP: 115/79 130/101 126/86 130/84  Pulse: 127 132 121 110  Temp:   98.5 F (36.9 C) 98.5 F (36.9 C)  TempSrc:      Resp:    18  Height:      Weight:      SpO2: 92% 97% 100% 100%    Intake/Output Summary (Last 24 hours) at 06/24/12 1022 Last data filed at 06/23/12 2015  Gross per 24 hour  Intake   1550 ml  Output    550 ml  Net   1000 ml    Exam:  General: Alert pleasant oriented, sling on the right shoulder. No pallor or icterus Cardiovascular: S1-S2 regular rate rhythm Respiratory: Clinically clear Abdomen: Soft Skin lower extremity edema  Data  Reviewed: Basic Metabolic Panel:  Lab 06/23/12 1308 06/19/12 2115  NA 134* 135  K 3.7 3.9  CL 97 99  CO2 27 27  GLUCOSE 148* 178*  BUN 9 15  CREATININE 0.46* 0.62  CALCIUM 8.8 9.0  MG -- --  PHOS -- --   Liver Function Tests: No results found for this basename: AST:5,ALT:5,ALKPHOS:5,BILITOT:5,PROT:5,ALBUMIN:5 in the last 168 hours No results found for this basename: LIPASE:5,AMYLASE:5 in the last 168 hours No results found for this basename: AMMONIA:5 in the last 168 hours CBC:  Lab 06/19/12 2115  WBC 10.2  NEUTROABS 7.7  HGB 12.2  HCT 36.8  MCV 88.7  PLT 134*   Cardiac Enzymes: No results found for this basename: CKTOTAL:5,CKMB:5,CKMBINDEX:5,TROPONINI:5 in the last 168 hours BNP: No components found with this basename: POCBNP:5 CBG: No results found for this basename: GLUCAP:5 in the last 168 hours  Recent Results (from the past 240 hour(s))  URINE CULTURE     Status: Normal   Collection Time   06/20/12 12:20 AM      Component Value Range Status Comment   Specimen Description URINE, CATHETERIZED   Final    Special Requests NONE   Final    Culture  Setup Time 06/20/2012 03:43   Final    Colony Count NO GROWTH   Final    Culture NO GROWTH   Final    Report Status 06/21/2012 FINAL   Final   SURGICAL PCR SCREEN  Status: Normal   Collection Time   06/23/12 12:10 AM      Component Value Range Status Comment   MRSA, PCR NEGATIVE  NEGATIVE Final    Staphylococcus aureus NEGATIVE  NEGATIVE Final      Studies:              All Imaging reviewed and is as per above notation   Scheduled Meds:    . atorvastatin  40 mg Oral q1800  . calcium-vitamin D  1 tablet Oral Daily  .  ceFAZolin (ANCEF) IV  2 g Intravenous Q6H  . cholecalciferol  1,000 Units Oral Daily  . diltiazem  360 mg Oral Daily  . docusate sodium  100 mg Oral BID  . levothyroxine  100 mcg Oral QAC breakfast  . metoprolol tartrate  25 mg Oral BID  . multivitamin with minerals  1 tablet Oral Daily  .  potassium chloride SA  10 mEq Oral Daily  . sertraline  100 mg Oral Daily  . vitamin C  500 mg Oral Daily   Continuous Infusions:    . sodium chloride 75 mL/hr at 06/23/12 1001     Assessment/PlanD 1. R shoulder fracture-per Dr. Luiz Blare, NPO for surgery today.  INR 1.73, Hb 12.2 and acceptable-post-op pain mangement and DVT management as per orthopedics-PT/Ot ordered on d/c-get outpatient vitamin D level in one to 2 months-continue cholecalciferol 1000 units q. Friday, Os-Cal 1 tablet daily--Therapy to reassess her for best possible venue for safety-family declining SNF and think they prefer [facility] pt/ot 2. Atrial fibrillation, [place] Vasc score~3-patient should followup as an out-patient to determine best meds-she will cont ASA 325 3. Chronic TCP-had previously told her she is not a coumadin candidate 4. Hypertension-moderately controlled. Continue Cardizem to 60 mg daily, metoprolol 25 twice a day-she sustained HR's in the 130-140's and might need uptitration of meds if this sustains 5. Hypothyroidism-continue levothyroxine 100 mcg daily 6. Hypokalemia-continue potassium 10 mEq by mouth daily 7. ?DM-will cover with insulin if her glucose goes above 200 and get an A1c at that time 8. Depression continue sertraline 100 daily, to take alprazolam 0.25 3 times a day when necessary 9. Constipation continue Colace 100 twice a day  Code Status: Full Family Communication: Discussed with daughter at bedside Disposition Plan: Inpatient-PT consult pending given surgeries going to be done today   Pleas Koch, MD  [facility] Pager [phone] 06/24/2012, 10:22 AM    LOS: 5 days

## 2012-06-24 NOTE — Evaluation (Signed)
Occupational Therapy Evaluation Patient Details Name: Hayley [name] MRN: [id number] DOB: 1921/03/03 Today's Date: 06/24/2012 Time: 4098-1191 OT Time Calculation (min): 39 min  OT Assessment / Plan / Recommendation Clinical Impression  pt demos decline in function with strength, ROM, safety, activity tolerance and ADLs and would benefit from skilled OT services to restore PLOF to return home safely    OT Assessment  Patient needs continued OT Services    Follow Up Recommendations  SNF    Barriers to Discharge Other (comment) (Unsure if ffamily can provide 24 hr care/A) Recommending SNF for further therapy needs after acute stay, 2 of pt's sons insisting on pt not going to SNF. physician and case mgr aware  Equipment Recommendations       Recommendations for Other Services PT consult  Frequency  Min 2X/week    Precautions / Restrictions Precautions Precautions: Shoulder;Fall Type of Shoulder Precautions: wear sling at all times, except during hygiene and ROM exercises, PROM FF to 90 degrees only, ROM elbow, wrist and hand, NWB Precaution Booklet Issued: Yes (comment) Required Braces or Orthoses: Other Brace/Splint Restrictions Weight Bearing Restrictions: Yes RUE Weight Bearing: Non weight bearing       ADL  Eating/Feeding: Simulated;Minimal assistance Where Assessed - Eating/Feeding: Chair Grooming: Performed;Wash/dry face;Wash/dry hands;Minimal assistance Where Assessed - Grooming: Supported sitting Upper Body Bathing: Simulated;Maximal assistance Where Assessed - Upper Body Bathing: Supported sitting Lower Body Bathing: Simulated;Maximal assistance Where Assessed - Lower Body Bathing: Supported sitting Upper Body Dressing: Performed;Maximal assistance Where Assessed - Upper Body Dressing: Supported sitting Lower Body Dressing: Performed;+1 Total assistance Where Assessed - Lower Body Dressing: Unsupported sitting Toilet Transfer: Simulated;Moderate  assistance Toilet Transfer Method: Stand pivot;Sit to stand Toileting - Clothing Manipulation and Hygiene: Performed;Maximal assistance ADL Comments: pt and her dtr provided with education and demo of ADL A/E for use at home    OT Diagnosis: Generalized weakness  OT Problem List: Decreased strength;Impaired UE functional use;Increased edema;Pain;Decreased knowledge of precautions;Impaired balance (sitting and/or standing);Decreased knowledge of use of DME or AE;Decreased activity tolerance;Decreased safety awareness;Decreased range of motion OT Treatment Interventions: Self-care/ADL training;Therapeutic activities;Therapeutic exercise;Neuromuscular education;DME and/or AE instruction;Patient/family education;Balance training   OT Goals Acute Rehab OT Goals OT Goal Formulation: With patient Time For Goal Achievement: 07/01/12 Potential to Achieve Goals: Good ADL Goals Pt Will Perform Grooming: with set-up;with supervision;Sitting, edge of bed;Sitting at sink;Sitting, chair ADL Goal: Grooming - Progress: Goal set today Pt Will Perform Upper Body Bathing: with mod assist;Sitting, edge of bed;Sitting at sink ADL Goal: Upper Body Bathing - Progress: Goal set today Pt Will Perform Lower Body Bathing: with mod assist;with adaptive equipment;Sitting, edge of bed;Sitting at sink ADL Goal: Lower Body Bathing - Progress: Goal set today Pt Will Perform Upper Body Dressing: with mod assist;Sitting, chair;Sitting, bed ADL Goal: Upper Body Dressing - Progress: Goal set today Pt Will Perform Lower Body Dressing: with adaptive equipment;with mod assist ADL Goal: Lower Body Dressing - Progress: Goal set today Pt Will Transfer to Toilet: with min assist;with DME ADL Goal: Toilet Transfer - Progress: Goal set today Pt Will Perform Toileting - Clothing Manipulation: with mod assist;Standing ADL Goal: Toileting - Clothing Manipulation - Progress: Goal set today Pt Will Perform Toileting - Hygiene: with mod  assist;Leaning right and/or left on 3-in-1/toilet;Standing at 3-in-1/toilet ADL Goal: Toileting - Hygiene - Progress: Goal set today  Visit Information  Last OT Received On: 06/24/12    Subjective Data  Subjective: " I wish I hadn't fell " Patient Stated Goal: To return  home   Prior Functioning     Home Living Lives With: Daughter Available Help at Discharge: Family Type of Home: House Home Access: Stairs to enter [employer] of Steps: 1 Entrance Stairs-Rails: Right;Left Home Layout: Two level;Able to live on main level with bedroom/bathroom Bathroom Shower/Tub: Engineer, manufacturing systems: Standard (with riser) Bathroom Accessibility: Yes Home Adaptive Equipment: Grab bars around toilet;Bedside commode/3-in-1;Shower chair with back Prior Function Level of Independence: Independent Vocation: Retired Musician: No difficulties Dominant Hand: Right         Vision/Perception Vision - Assessment Eye Alignment: Within Chemical engineer Perception: Within Functional Limits   Cognition  Overall Cognitive Status: Appears within functional limits for tasks assessed/performed Area of Impairment: Safety/judgement Arousal/Alertness: Awake/alert Orientation Level: Appears intact for tasks assessed Behavior During Session: [facility] for tasks performed Safety/Judgement: Decreased awareness of safety precautions    Extremity/Trunk Assessment Right Upper Extremity Assessment RUE ROM/Strength/Tone: Unable to fully assess;Due to precautions (R UE in sling and ace wrap) Left Upper Extremity Assessment LUE ROM/Strength/Tone: Within functional levels     Mobility Bed Mobility Bed Mobility: Not assessed Transfers Transfers: Sit to Stand;Stand to Sit Sit to Stand: From chair/3-in-1;4: Min assist;Other (comment) (decreased balance, fearful) Stand to Sit: 4: Min assist;To chair/3-in-1;Other (comment) (decreased balance, fearful) Details for  Transfer Assistance: cues for safety     Shoulder Instructions Donning/doffing shirt without moving shoulder: Patient able to independently direct caregiver Method for sponge bathing under operated UE: Patient able to independently direct caregiver Donning/doffing sling/immobilizer: Patient able to independently direct caregiver Correct positioning of sling/immobilizer: Patient able to independently direct caregiver ROM for elbow, wrist and digits of operated UE: Patient able to independently direct caregiver Sling wearing schedule (on at all times/off for ADL's): Patient able to independently direct caregiver Positioning of UE while sleeping: Patient able to independently direct caregiver   Exercise Shoulder Exercises Elbow Flexion: AROM;AAROM;Right;10 reps;Seated Elbow Extension: AROM;AAROM;Right;10 reps Wrist Flexion: AROM;AAROM;Right;10 reps Wrist Extension: AROM;AAROM;Right;10 reps Digit Composite Flexion: AROM;AAROM;Right;10 reps Composite Extension: AROM;AAROM;Right;10 reps   Balance Static Standing Balance Static Standing - Balance Support: Left upper extremity supported Static Standing - Level of Assistance: 4: Min assist   End of Session OT - End of Session Equipment Utilized During Treatment: Gait belt Activity Tolerance: Patient limited by pain;Patient limited by fatigue Patient left: in chair;with call bell/phone within reach;with family/visitor present  GO     Galen Manila 06/24/2012, 11:45 AM

## 2012-06-24 NOTE — Plan of Care (Signed)
Problem: Phase III Progression Outcomes Goal: Activity at appropriate level-compared to baseline (UP IN CHAIR FOR HEMODIALYSIS)  Recommend SNF for further therapy needs after acute stay, however, pt's family insisting on pt not going to SNF. Physician and case mgr aware and case mgr to address with family

## 2012-06-24 NOTE — Clinical Social Work Psychosocial (Signed)
Clinical Social Work Department  BRIEF PSYCHOSOCIAL ASSESSMENT  Patient: Hayley [name]  Account Number: [id number]  Admit date: 06/20/12 Clinical Social Worker Date/Time:  Referred by: Physician Date Referred: 06/23/12 Referred for   SNF Placement   Other Referral:  Interview type: Patient  Other interview type: Patient's daughter was present PSYCHOSOCIAL DATA  Living Status: FAMILY  Admitted from facility:  Level of care:  Primary support name: Primary support relationship to patient:  Degree of support available:  Strong and vested  CURRENT CONCERNS  Current Concerns   Post-Acute Placement   Other Concerns:  SOCIAL WORK ASSESSMENT / PLAN  CSW met with pt re: PT recommendation for SNF.   Pt lives with her two daughters  CSW explained placement process and answered questions.   Pt reports [facility] is her preference    CSW completed FL2 and initiated [facility] search.     CSW to f/u with offers. ay   Assessment/plan status: Information/Referral to [facility]  Other assessment/ plan:  Information/referral to community resources:  SNF   PTAR   PATIENT'S/FAMILY'S RESPONSE TO PLAN OF CARE:  Pt  And pt's daughter are agreeable to ST SNF in order to increase strength and independence with mobility prior to returning home. Patient's two sons do not want the patient to go to SNF. The pt's daughter reported that she wants to speak with her brothers before making any decisions. Pt and pt's daughter did agree to SW initiating SNF placement   Pt verbalized understanding of placement process and appreciation for CSW assist.   Sabino Niemann, MSW [phone]

## 2012-06-24 NOTE — Op Note (Signed)
Hayley [name] NO.:  [id number]  MEDICAL RECORD NO.:  [id number]  LOCATION:  [id number]                        FACILITY:  [facility]  PHYSICIAN:  Harvie Junior, M.D.   DATE OF BIRTH:  09/19/1920  DATE OF PROCEDURE:  06/23/2012 DATE OF DISCHARGE:                              OPERATIVE REPORT   PREOPERATIVE DIAGNOSIS:  Four-part proximal humerus fracture right with humeral head split.  POSTOPERATIVE DIAGNOSIS: Four-part proximal humerus fracture right with humeral head split.  PROCEDURE: 1. Cemented hemiarthroplasty, right shoulder, with a size 10 stem with     a -5 neck trunnion and a 44 mm +18 head ball. 2. Rotator cuff repair right. 3. Bicipital tenodesis right.  SURGEON:  Harvie Junior, MD  ASSISTANT:  Marshia Ly, PA  ANESTHESIA:  General.  BRIEF HISTORY:  Hayley [name] is an 79 year old female with a history of having fall onto her right shoulder.  She suffered a fracture which was evaluated by CT and noted to be a four-part proximal humerus fracture with a head split after this given her very viable nature.  I had a discussion with her family but felt that she certainly was a candidate for hemiarthroplasty and once this was decided she was brought to the operating room for this procedure.  The patient was chronically on Coumadin and we did have to wait for INR to come down to a reasonable level and once it got to a reasonable level she was taken to the operating room for repair.  DESCRIPTION OF PROCEDURE:  The patient was taken to the operating room. After adequate anesthesia was obtained with general anesthetic, the patient was placed supine on the operating table.  She was then moved in the beach chair position.  All bony prominences were well padded. Attention was then turned to the right shoulder.  After routine prep and drape, an incision was made for deltopectoral exposure.  Once this was done, the cephalic vein was identified.  The  deltopectoral interval was exploited.  Retractor was placed under the conjoined tendon and under the deltoid.  Once this was done, we did a bursectomy of the shoulder. The rotator cuff was torn really between the supraspinatus and infraspinatus, and we initially just got this area and repaired this rotator cuff tear side to side.  We then found the biceps tendon and exploited up into the joint and open the bicipital interval.  We then took an osteotome and fractured the tuberosities in this area, and once that was completed, the rotator interval was exploited proximally and the greater tuberosity with the supraspinatus and infraspinatus were retracted posteriorly and the lesser tuberosity with the subscap was retracted anteriorly.  This gave access into the where the humeral head was.  The biceps tendon was released from the superior glenoid tubercle and tenodesed to the pectorals major tendon with the arm in extension. The remaining this portion was retracted.  The humeral head was identified in 2 pieces and this was retracted.  Once the humeral head was taken out, it was taken to the back table and cancellous bone was exploited out of this area.  Once that was accomplished, it was sized to a 44  AP on the back table.  Attention now was turned towards the shaft. It was sequentially reamed to 10 and that was a size chosen and a trial body with a -5 head was put in place, and we assessed the neck length with a 44 head, had excellent reduction assessed as best we could the neck length and kind of marked that undermined.  At this point, attention was turned back towards the stem.  It was irrigated thoroughly, suctioned dry, and at this point, on the back table, was mixed some cement and the stem was then cemented into place with 30 degrees of retroversion and with a height which had been previously chosen as the appropriate height.  We put the stem in at that height. We were able to reduce  the tuberosities under the head which we thought would be critical.  At this point, the stitches were passed around the stem before a home-run stitch through the front loop of the stem. Stitches were passed through the anterior flange to the lesser tuberosity and to the posterior flange of the greater tuberosity and holes were drilled in the shaft of the bone.  Once this was done, the cement was allowed to harden, and then we trialed with a 0 head with a +18 44, got excellent range of motion stability, so this was opened put in place, hammered in, and then reduced excellent stability.  At this point, was achieved.  Once this was completed, attention was turned towards packing bone graft in and around the stem and around the tuberosities and a tremendous amount of bone graft was packed into this area, and then the home-run stitch was tied with the head in the reduced position.  This gave the tuberosities a very nice repair and then the stitch was passed through the shaft and up under the anterior tuberosity and 1 in the posterior tuberosity and then these were reduced and tied. The stitches were then holding the posterior greater tuberosity to the shaft was tied and the lesser tuberosity of the shaft was tied and then a stitch which had previously been passed which goes through the anterior hole and a figure-of-eight status over the top of the repair and then down underneath, and then over the top of the repair and this gave [place] excellent repair.  In a figure-of-eight fashion, pulling the rotator cuff down to the stem.  Once this was done, this then the adnexal repair had been achieved with the use a stitches and tuberosity repair and a tremendous amount of bone graft were put in this area.  Arm was put through a range of motion.  At this point, had excellent range of motion, easy over to elevation.  We were very happy and satisfied with the repair at this point.  The rotator interval was  now closed and a final check was made of the biceps tendon tenodesis.  This was well performed.  The wound was copiously and thoroughly irrigated suctioned dry.  The wound was then closed in layers and the skin with 3-0 Monocryl subcuticular.  Benzoin and Steri-Strips were applied.  Sterile compressive dressing was applied, and the patient taken to recovery room in satisfactory condition.  Estimated blood loss for the procedure was less than 250 mL.     Harvie Junior, M.D.     Ranae Plumber  D:  06/23/2012  T:  06/24/2012  Job:  [id number]

## 2012-06-24 NOTE — Progress Notes (Signed)
Subjective: 1 Day Post-Op Procedure(s) (LRB): SHOULDER HEMI-ARTHROPLASTY (Right) Patient reports pain as 3 on 0-10 scale.   Up in chair.  Objective: Vital signs in last 24 hours: Temp:  [97.8 F (36.6 C)-98.5 F (36.9 C)] 98.5 F (36.9 C) (01/14 0622) Pulse Rate:  [96-132] 110  (01/14 0622) Resp:  [14-18] 18  (01/14 0622) BP: (104-161)/(66-101) 130/84 mmHg (01/14 0622) SpO2:  [92 %-100 %] 100 % (01/14 0622)  Intake/Output from previous day: 01/13 0701 - 01/14 0700 In: 1550 [I.V.:1550] Out: 550 [Urine:300; Blood:250] Intake/Output this shift:    No results found for this basename: HGB:5 in the last 72 hours No results found for this basename: WBC:2,RBC:2,HCT:2,PLT:2 in the last 72 hours  Basename 06/23/12 1000  NA 134*  K 3.7  CL 97  CO2 27  BUN 9  CREATININE 0.46*  GLUCOSE 148*  CALCIUM 8.8    Basename 06/24/12 0500 06/22/12 0509  LABPT -- --  INR 1.25 1.73*   Right shoulder: Neurovascular intact Sensation intact distally Intact pulses distally Incision: dressing C/D/I  Assessment/Plan: 1 Day Post-Op Procedure(s) (LRB): SHOULDER HEMI-ARTHROPLASTY (Right) Plan: Up with therapy  OT SCD's for VTE prophylaxis.Early mobilization. D/C foley. CIR consult SNF vs CIR  Riordan Walle G 06/24/2012, 9:20 AM

## 2012-06-25 DIAGNOSIS — S42209A Unspecified fracture of upper end of unspecified humerus, initial encounter for closed fracture: Secondary | ICD-10-CM

## 2012-06-25 DIAGNOSIS — S2239XA Fracture of one rib, unspecified side, initial encounter for closed fracture: Secondary | ICD-10-CM

## 2012-06-25 DIAGNOSIS — D693 Immune thrombocytopenic purpura: Secondary | ICD-10-CM

## 2012-06-25 LAB — CBC
HCT: 25 % — ABNORMAL LOW (ref 36.0–46.0)
MCH: 29.7 pg (ref 26.0–34.0)
MCV: 89.6 fL (ref 78.0–100.0)
Platelets: 169 10*3/uL (ref 150–400)
RDW: 13.8 % (ref 11.5–15.5)

## 2012-06-25 LAB — BASIC METABOLIC PANEL
CO2: 27 mEq/L (ref 19–32)
Calcium: 8.4 mg/dL (ref 8.4–10.5)
Chloride: 98 mEq/L (ref 96–112)
Creatinine, Ser: 0.47 mg/dL — ABNORMAL LOW (ref 0.50–1.10)
Glucose, Bld: 118 mg/dL — ABNORMAL HIGH (ref 70–99)

## 2012-06-25 NOTE — Progress Notes (Signed)
[facility]  Patient Status: New  [facility] is providing the following services: PT and OT  If patient discharges after hours, please call [phone].   Hayley [name] 06/25/2012, 4:58 PM

## 2012-06-25 NOTE — Progress Notes (Signed)
Patient plans to return home with home health. Clinical Social Worker will sign off for now as social work intervention is no longer needed. Please consult [place] again if new need arises.   Sabino Niemann, MSW, [phone]

## 2012-06-25 NOTE — Progress Notes (Addendum)
Subjective: 2 Days Post-Op Procedure(s) (LRB): SHOULDER HEMI-ARTHROPLASTY (Right) Patient reports pain as 3 on 0-10 scale.  Pt's daughter feels she is able to care for her at home. Would like to go home tomorrow so she can get one more day of PT in hosp to build up strength.   Objective: Vital signs in last 24 hours: Temp:  [98.4 F (36.9 C)-99.5 F (37.5 C)] 99.1 F (37.3 C) (01/15 0457) Pulse Rate:  [70-112] 106  (01/15 1015) Resp:  [16-18] 16  (01/15 0457) BP: (99-132)/(54-72) 119/54 mmHg (01/15 1014) SpO2:  [94 %-97 %] 97 % (01/15 0457)  Intake/Output from previous day: 01/14 0701 - 01/15 0700 In: 2100 [P.O.:1200; I.V.:900] Out: 700 [Urine:700] Intake/Output this shift:     Basename 06/25/12 0740  HGB 8.3*    Basename 06/25/12 0740  WBC 8.5  RBC 2.79*  HCT 25.0*  PLT 169    Basename 06/25/12 0740 06/23/12 1000  NA 133* 134*  K 3.9 3.7  CL 98 97  CO2 27 27  BUN 9 9  CREATININE 0.47* 0.46*  GLUCOSE 118* 148*  CALCIUM 8.4 8.8    Basename 06/24/12 0500  LABPT --  INR 1.25   Right shoulder exam: Neurovascular intact Sensation intact distally Intact pulses distally Incision: dressing C/D/I  Assessment/Plan: 2 Days Post-Op Procedure(s) (LRB): SHOULDER HEMI-ARTHROPLASTY (Right) Plan: Up with therapy Discharge home with home health tomorrow. Stable for discharge from ortho viewpoint. Will need HHPT/OT. F/U with Dr Luiz Blare in 2 weeks.  Remo Kirschenmann L 06/25/2012, 11:14 AM

## 2012-06-25 NOTE — Progress Notes (Signed)
Patient ID: Hayley [name]  female  ZOX:[id number]    DOB: [birth year]    DOA: 06/19/2012  PCP: Gwen Pounds, MD  Assessment/Plan:  1. R shoulder fracture-status post right shoulder hemiarthroplasty, postop day 2 - continue aspirin, doing well with PT, recommending skilled nursing facility however per daughter, and they want her home. Will arrange for home health PT OT for DC tomorrow 2. Atrial fibrillation, [place] Vasc score~continue ASA 325mg . Previously told that she is not a Coumadin candidate and she is high risk for falls.  3. Chronic TCP-had previously told her she is not a coumadin candidate 4. Hypertension-moderately controlled. Continue Cardizem to 60 mg daily, metoprolol 25 twice a day-she sustained HR's in the 130-140's and might need uptitration of meds if this sustains 5. Hypothyroidism-continue levothyroxine 100 mcg daily 6. Hypokalemia-continue potassium 10 mEq by mouth daily 7. DM-will cover with insulin if her glucose goes above 200 and get an A1c at that time 8. Depression continue sertraline 100 daily, to take alprazolam 0.25 3 times a day when necessary 9. Constipation continue Colace 100 twice a day  DVT Prophylaxis: On aspirin  Code Status: Full CODE STATUS  Disposition: Tomorrow    Subjective: Patient resting in the recliner, states pain is controlled, looking forward to be home tomorrow, daughter at the bedside  Objective: Weight change:   Intake/Output Summary (Last 24 hours) at 06/25/12 1224 Last data filed at 06/25/12 0600  Gross per 24 hour  Intake   1860 ml  Output    700 ml  Net   1160 ml   Blood pressure 119/54, pulse 106, temperature 99.1 F (37.3 C), temperature source Oral, resp. rate 16, height 5\' 1"  (1.549 m), weight 70.6 kg (155 lb 10.3 oz), SpO2 97.00%.  Physical Exam: General: Alert and awake, oriented x3, not in any acute distress. HEENT: anicteric sclera, pupils reactive to light and accommodation, EOMI CVS: S1-S2 clear, no murmur rubs  or gallops Chest: clear to auscultation bilaterally, no wheezing, rales or rhonchi Abdomen: soft nontender, nondistended, normal bowel sounds, no organomegaly Extremities: no cyanosis, clubbing or edema noted bilaterally. Right shoulder in sling Neuro: Cranial nerves II-XII intact, no focal neurological deficits  Lab Results: Basic Metabolic Panel:  Lab 06/25/12 8119 06/23/12 1000  NA 133* 134*  K 3.9 3.7  CL 98 97  CO2 27 27  GLUCOSE 118* 148*  BUN 9 9  CREATININE 0.47* 0.46*  CALCIUM 8.4 8.8  MG -- --  PHOS -- --   Liver Function Tests: No results found for this basename: AST:2,ALT:2,ALKPHOS:2,BILITOT:2,PROT:2,ALBUMIN:2 in the last 168 hours No results found for this basename: LIPASE:2,AMYLASE:2 in the last 168 hours No results found for this basename: AMMONIA:2 in the last 168 hours CBC:  Lab 06/25/12 0740 06/19/12 2115  WBC 8.5 10.2  NEUTROABS -- 7.7  HGB 8.3* 12.2  HCT 25.0* 36.8  MCV 89.6 88.7  PLT 169 134*     Micro Results: Recent Results (from the past 240 hour(s))  URINE CULTURE     Status: Normal   Collection Time   06/20/12 12:20 AM      Component Value Range Status Comment   Specimen Description URINE, CATHETERIZED   Final    Special Requests NONE   Final    Culture  Setup Time 06/20/2012 03:43   Final    Colony Count NO GROWTH   Final    Culture NO GROWTH   Final    Report Status 06/21/2012 FINAL   Final  SURGICAL PCR SCREEN     Status: Normal   Collection Time   06/23/12 12:10 AM      Component Value Range Status Comment   MRSA, PCR NEGATIVE  NEGATIVE Final    Staphylococcus aureus NEGATIVE  NEGATIVE Final     Studies/Results: Dg Chest 1 View  06/19/2012  *RADIOLOGY REPORT*  Clinical Data: Fall, pain  CHEST - 1 VIEW  Comparison: 12/08/2010  Findings: Cardiomegaly with mild vascular congestion.  Calcified tortuous aorta.  No pulmonary edema or focal infiltrates.  No effusion or pneumothorax. Suspected right lateral rib fractures, incompletely  evaluated on this chest radiograph. Please see shoulder dictation for additional findings related to the right humerus. Chronic deformity right clavicle.  IMPRESSION: Cardiomegaly with mild vascular congestion.  No active infiltrates. Suspected right-sided rib fractures, possibly acute. Acute right humeral fracture.   Original Report Authenticated By: Davonna Belling, M.D.    Dg Shoulder Right  06/19/2012  *RADIOLOGY REPORT*  Clinical Data: Fall, pain.  RIGHT SHOULDER - 2+ VIEW  Comparison: Plain films right shoulder 08/23/2011.  Findings: The patient has an acute surgical neck fracture of the right humerus which involves the greater tuberosity.  There is at least one shaft width anterior displacement.  The acromioclavicular joint is intact with some degenerative change noted.  Right clavicle fracture seen on the prior study has healed with one and one half shaft width inferior displacement of the distal fragment.  IMPRESSION:  1.  Anteriorly displaced surgical neck fracture right humerus involves the greater tuberosity. 2.  Interval healing of a right clavicle fracture.   Original Report Authenticated By: Holley Dexter, M.D.    Ct Shoulder Right Wo Contrast  06/21/2012  *RADIOLOGY REPORT*  Clinical Data:  Shoulder fracture.  CT RIGHT SHOULDER WITHOUT CONTRAST  Technique:  Multidetector CT imaging of the right shoulder was performed according to the standard protocol without intravenous contrast. Multiplanar CT image reconstructions were also generated.  Comparison:  Plain film 06/19/2012.  Findings:  Comminuted four part proximal humerus fracture is present.  The comminuted fracture extends into the posterior articular surface of the humeral head.  There is one shaft width anterior displacement of the proximal humeral metaphysis relative to the head and neck.  Transverse surgical neck fracture.  1 cm lateral displacement of the proximal humeral metaphysis relative to the head.  Mild impaction is present.  There  is a 2.5 cm dorsal humeral head fracture fragment posterior aspect of the joint.  Large hemarthrosis is present.  Scattered areas of dependent atelectasis are present in the lungs, prominent at the apex.  No aggressive osseous lesions.  Cervical spondylosis. There is a healed fracture of the right clavicle at the junction of the middle and distal third of the clavicle.  Moderate AC joint osteoarthritis.  Type 2 acromion.  Medial clavicle appears intact. The right sternoclavicular joint appears normal.  No displaced rib fractures are identified.  IMPRESSION: Comminuted and displaced four part proximal right humerus fracture.  *RADIOLOGY REPORT*  3-DIMENSIONAL CT IMAGE RENDERING AT INDEPENDENT WORKSTATION:  Technique:  3-dimensional CT images were rendered by post- processing of the original CT data at independent workstation.  The 3-dimensional CT images were interpreted, and findings were reported in the accompanying complete CT report for this study.  Three dimensional reconstructions were performed by the radiologist at an independent workstation.   Original Report Authenticated By: Andreas Newport, M.D.    Ct 3d Recon At Scanner  06/21/2012  *RADIOLOGY REPORT*  Clinical Data:  Shoulder  fracture.  CT RIGHT SHOULDER WITHOUT CONTRAST  Technique:  Multidetector CT imaging of the right shoulder was performed according to the standard protocol without intravenous contrast. Multiplanar CT image reconstructions were also generated.  Comparison:  Plain film 06/19/2012.  Findings:  Comminuted four part proximal humerus fracture is present.  The comminuted fracture extends into the posterior articular surface of the humeral head.  There is one shaft width anterior displacement of the proximal humeral metaphysis relative to the head and neck.  Transverse surgical neck fracture.  1 cm lateral displacement of the proximal humeral metaphysis relative to the head.  Mild impaction is present.  There is a 2.5 cm dorsal humeral  head fracture fragment posterior aspect of the joint.  Large hemarthrosis is present.  Scattered areas of dependent atelectasis are present in the lungs, prominent at the apex.  No aggressive osseous lesions.  Cervical spondylosis. There is a healed fracture of the right clavicle at the junction of the middle and distal third of the clavicle.  Moderate AC joint osteoarthritis.  Type 2 acromion.  Medial clavicle appears intact. The right sternoclavicular joint appears normal.  No displaced rib fractures are identified.  IMPRESSION: Comminuted and displaced four part proximal right humerus fracture.  *RADIOLOGY REPORT*  3-DIMENSIONAL CT IMAGE RENDERING AT INDEPENDENT WORKSTATION:  Technique:  3-dimensional CT images were rendered by post- processing of the original CT data at independent workstation.  The 3-dimensional CT images were interpreted, and findings were reported in the accompanying complete CT report for this study.  Three dimensional reconstructions were performed by the radiologist at an independent workstation.   Original Report Authenticated By: Andreas Newport, M.D.    Dg Shoulder Right Port  06/23/2012  *RADIOLOGY REPORT*  Clinical Data: Postop hemiarthroplasty  PORTABLE RIGHT SHOULDER - 2+ VIEW  Comparison: 06/19/2012  Findings: Right shoulder hemiarthroplasty has been performed. Anatomic alignment of the osseous and prosthetic structures.  Small bone fragment is seen just lateral to the proximal prosthesis. Small bony density projects inferior to the glenohumeral joint of unknown significance.  This may simply represent an external object. Chronic deformity of the clavicle is noted.  IMPRESSION: Right shoulder hemiarthroplasty anatomically aligned.   Original Report Authenticated By: Jolaine Click, M.D.     Medications: Scheduled Meds:   . aspirin  325 mg Oral Daily  . atorvastatin  40 mg Oral q1800  . calcium-vitamin D  1 tablet Oral Daily  . cholecalciferol  1,000 Units Oral Daily  .  diltiazem  360 mg Oral Daily  . docusate sodium  100 mg Oral BID  . levothyroxine  100 mcg Oral QAC breakfast  . metoprolol tartrate  25 mg Oral BID  . multivitamin with minerals  1 tablet Oral Daily  . potassium chloride SA  10 mEq Oral Daily  . sertraline  100 mg Oral Daily  . vitamin C  500 mg Oral Daily      LOS: 6 days   RAI,RIPUDEEP M.D. [facility] 06/25/2012, 12:24 PM Pager: [phone]  If 7PM-7AM, please contact night-coverage [email or web address] Password [email or web address]

## 2012-06-25 NOTE — Progress Notes (Signed)
[facility]  Patient Status: New  [facility] is providing the following services: PT and OT  If patient discharges after hours, please call [phone].   Hayley [name] 06/25/2012, 1:10 PM

## 2012-06-25 NOTE — Progress Notes (Signed)
CARE MANAGEMENT NOTE 06/25/2012  Patient:  Hayley [name], Hayley [name]   Account Number:  [id number]  Date Initiated:  06/25/2012  Documentation initiated by:  Vance Peper  Subjective/Objective Assessment:   79 yr old female s/p right humurus fracture     Action/Plan:   CM spoke with patient and family on 1/14 and 1/15 concerning HH needs. Family was making decsion regarding HH vs SNF. Home Health final choice. Choice offered.   Anticipated DC Date:  06/26/2012   Anticipated DC Plan:  HOME W HOME HEALTH SERVICES      DC Planning Services  CM consult      [facility] Choice  HOME HEALTH   Choice offered to / List presented to:  C-4 Adult Children        HH arranged  HH-3 OT  HH-2 PT      HH agency  [facility]   Status of service:  Completed, signed off Medicare Important Message given?   (If response is "NO", the following Medicare IM given date fields will be blank) Date Medicare IM given:   Date Additional Medicare IM given:    Discharge Disposition:  HOME W HOME HEALTH SERVICES  Per UR Regulation:    If discussed at Long Length of Stay Meetings, dates discussed:    Comments:

## 2012-06-26 LAB — URINALYSIS, ROUTINE W REFLEX MICROSCOPIC
Bilirubin Urine: NEGATIVE
Hgb urine dipstick: NEGATIVE
Ketones, ur: NEGATIVE mg/dL
Nitrite: NEGATIVE
Urobilinogen, UA: 0.2 mg/dL (ref 0.0–1.0)
pH: 7.5 (ref 5.0–8.0)

## 2012-06-26 MED ORDER — DSS 100 MG PO CAPS: 100.0000 mg | ORAL_CAPSULE | Freq: Two times a day (BID) | ORAL | Status: DC

## 2012-06-26 MED ORDER — ASPIRIN 325 MG PO TABS: 325.0000 mg | ORAL_TABLET | Freq: Every day | ORAL | Status: DC

## 2012-06-26 MED ORDER — OXYCODONE HCL 5 MG PO TABS: 5.0000 mg | ORAL_TABLET | Freq: Three times a day (TID) | ORAL | Status: DC | PRN

## 2012-06-26 NOTE — Progress Notes (Signed)
Subjective: 3 Days Post-Op Procedure(s) (LRB): SHOULDER HEMI-ARTHROPLASTY (Right) Patient reports pain as 5 on 0-10 scale.    Objective: Vital signs in last 24 hours: Temp:  [98 F (36.7 C)-99.8 F (37.7 C)] 98.3 F (36.8 C) (01/16 0530) Pulse Rate:  [73-86] 86  (01/16 0530) Resp:  [15-18] 18  (01/16 0530) BP: (90-131)/(58-72) 131/72 mmHg (01/16 0530) SpO2:  [95 %-99 %] 99 % (01/16 0530)  Intake/Output from previous day: 01/15 0701 - 01/16 0700 In: 230 [P.O.:230] Out: -  Intake/Output this shift:     Basename 06/25/12 0740  HGB 8.3*    Basename 06/25/12 0740  WBC 8.5  RBC 2.79*  HCT 25.0*  PLT 169    Basename 06/25/12 0740  NA 133*  K 3.9  CL 98  CO2 27  BUN 9  CREATININE 0.47*  GLUCOSE 118*  CALCIUM 8.4    Basename 06/24/12 0500  LABPT --  INR 1.25    Neurologically intact ABD soft Neurovascular intact Sensation intact distally Intact pulses distally No cellulitis present Compartment soft  Assessment/Plan: 3 Days Post-Op Procedure(s) (LRB): SHOULDER HEMI-ARTHROPLASTY (Right) Advance diet Up with therapy Discharge home with home health pt to begin elbow rom and passive rom r shoulder.  F/u Jachelle Fluty 2 weeks  Brantlee Penn L 06/26/2012, 11:04 AM

## 2012-06-26 NOTE — Discharge Summary (Signed)
Physician Discharge Summary  Patient ID: Hayley [name] MRN: [id number] DOB/AGE: [birth year] 79 y.o.  Admit date: 06/19/2012 Discharge date: 06/26/2012  Primary Care Physician:  Gwen Pounds, MD  Discharge Diagnoses:    . ITP (idiopathic thrombocytopenic purpura) . (Resolved) Closed fracture of right humerus . A-fib . HTN (hypertension) . Hypothyroidism . Closed fracture of right proximal humerus status post a shoulder hemiarthroplasty postop day #3 today  Consults:  Dr Luiz Blare, orthopedics  Discharge Medications:   Medication List     As of 06/26/2012 10:49 AM    STOP taking these medications         furosemide 20 MG tablet   Commonly known as: LASIX      TAKE these medications         alendronate 70 MG tablet   Commonly known as: FOSAMAX   Take 70 mg by mouth every 30 (thirty) days. Take with a full glass of water on an empty stomach.      ALPRAZolam 0.25 MG tablet   Commonly known as: XANAX   Take 0.25 mg by mouth 3 (three) times daily as needed. For anxiety.      atorvastatin 40 MG tablet   Commonly known as: LIPITOR   Take 40 mg by mouth daily.      BIOTIN PO   Take 1 capsule by mouth daily.      calcium-vitamin D 500-200 MG-UNIT per tablet   Commonly known as: OSCAL WITH D   Take 1 tablet by mouth daily.      cholecalciferol 1000 UNITS tablet   Commonly known as: VITAMIN D   Take 1,000 Units by mouth daily.      diltiazem 360 MG 24 hr capsule   Commonly known as: CARDIZEM CD   Take 360 mg by mouth daily.      DSS 100 MG Caps   Take 100 mg by mouth 2 (two) times daily. For constipation      ECHINACEA ACZ PO   Take by mouth daily.      levothyroxine 100 MCG tablet   Commonly known as: SYNTHROID, LEVOTHROID   Take 100 mcg by mouth daily.      metoprolol tartrate 25 MG tablet   Commonly known as: LOPRESSOR   Take 25 mg by mouth 2 (two) times daily.      multivitamin with minerals Tabs   Take 1 tablet by mouth daily.      oxyCODONE 5 MG  immediate release tablet   Commonly known as: Oxy IR/ROXICODONE   Take 1 tablet (5 mg total) by mouth every 8 (eight) hours as needed for pain.      potassium chloride SA 20 MEQ tablet   Commonly known as: K-DUR,KLOR-CON   Take 10 mEq by mouth daily.      sertraline 100 MG tablet   Commonly known as: ZOLOFT   Take 100 mg by mouth daily.      vitamin C 500 MG tablet   Commonly known as: ASCORBIC ACID   Take 500 mg by mouth daily.      warfarin 5 MG tablet   Commonly known as: COUMADIN   Take 2.5-5 mg by mouth daily. 1 tab daily except for 0.5 tab on Mondays and Fridays.         Brief H and P: For complete details please refer to admission H and P, but in brief patient is 79 year old female with history of atrial fibrillation on Coumadin, HTN, but no hx of  DM, prior CVA or CHF (CHADS=2), with hx of very frequent falls, ITP, fell again tonight and suffered a displaced fracture of her right humerus. She denied LOC, palpitation, chest pain, fever or chills. She had at least 4-5 falls this year, and had broken her right clavicle with her prior fall. She also has ?acute rib fracture as well on the right side. She had normal WBC and normal HB. Her platelet count is 134 K. She has normal renal function tests.    Hospital Course:   1. R shoulder fracture-orthopedics was consulted and patient underwent right shoulder hemiarthroplasty, postop day3 today. Coumadin was held at the time of admission for the surgery and patient was placed on aspirin. Physical therapy recommended skilled nursing facility however patient and her family declined. Home health PT was arranged.   2. Atrial fibrillation: Patient was placed on ASA 325mg  perioperatively, she is a high risk for falls and patient was explained the risks and benefits of being on Coumadin especially with her recurrent falls. Patient states that she cannot take aspirin due to her history of ITP and wants to continue Coumadin. She agreed to make a  quick followup appointment with her cardiologist, Dr. Herbie Baltimore for his recommendation. 3. Hypertension-moderately controlled. Continue Cardizem to 60 mg daily, metoprolol 25mg  BID 4. Hypothyroidism-continue levothyroxine 100 mcg daily 5. Hypokalemia-continue potassium 10 mEq by mouth daily 6. DM-patient was placed on insulin  7. Depression continue sertraline 100 daily, to take alprazolam 0.25 3 times a day when necessary 8. Constipation continue Colace 100 twice a day   Day of Discharge BP 131/72  Pulse 86  Temp 98.3 F (36.8 C) (Oral)  Resp 18  Ht 5\' 1"  (1.549 m)  Wt 70.6 kg (155 lb 10.3 oz)  BMI 29.41 kg/m2  SpO2 99%  Physical Exam:  General: Alert and awake, oriented x3, NAD.  HEENT: anicteric sclera, PERLA, EOMI  CVS: S1-S2 clear, no m/r/g  Chest: CTAB, no wheezing, rales or rhonchi  Abdomen: soft nontender, nondistended, normal bowel sounds, no organomegaly  Extremities: no cyanosis, clubbing or edema noted bilaterally. Right shoulder in sling    The results of significant diagnostics from this hospitalization (including imaging, microbiology, ancillary and laboratory) are listed below for reference.    LAB RESULTS: Basic Metabolic Panel:  Lab 06/25/12 1610 06/23/12 1000  NA 133* 134*  K 3.9 3.7  CL 98 97  CO2 27 27  GLUCOSE 118* 148*  BUN 9 9  CREATININE 0.47* 0.46*  CALCIUM 8.4 8.8  MG -- --  PHOS -- --   CBC:  Lab 06/25/12 0740 06/19/12 2115  WBC 8.5 10.2  NEUTROABS -- 7.7  HGB 8.3* 12.2  HCT 25.0* 36.8  MCV 89.6 --  PLT 169 134*    Significant Diagnostic Studies:  Dg Chest 1 View  06/19/2012  *RADIOLOGY REPORT*  Clinical Data: Fall, pain  CHEST - 1 VIEW  Comparison: 12/08/2010  Findings: Cardiomegaly with mild vascular congestion.  Calcified tortuous aorta.  No pulmonary edema or focal infiltrates.  No effusion or pneumothorax. Suspected right lateral rib fractures, incompletely evaluated on this chest radiograph. Please see shoulder dictation for  additional findings related to the right humerus. Chronic deformity right clavicle.  IMPRESSION: Cardiomegaly with mild vascular congestion.  No active infiltrates. Suspected right-sided rib fractures, possibly acute. Acute right humeral fracture.   Original Report Authenticated By: Davonna Belling, M.D.    Dg Shoulder Right  06/19/2012  *RADIOLOGY REPORT*  Clinical Data: Fall, pain.  RIGHT SHOULDER -  2+ VIEW  Comparison: Plain films right shoulder 08/23/2011.  Findings: The patient has an acute surgical neck fracture of the right humerus which involves the greater tuberosity.  There is at least one shaft width anterior displacement.  The acromioclavicular joint is intact with some degenerative change noted.  Right clavicle fracture seen on the prior study has healed with one and one half shaft width inferior displacement of the distal fragment.  IMPRESSION:  1.  Anteriorly displaced surgical neck fracture right humerus involves the greater tuberosity. 2.  Interval healing of a right clavicle fracture.   Original Report Authenticated By: Holley Dexter, M.D.      Disposition and Follow-up:     Discharge Orders    Future Appointments: Provider: Department: Dept Phone: Center:   04/16/2013 2:30 PM Radene Gunning [facility] MEDICAL ONCOLOGY [phone] None   04/16/2013 3:00 PM Ladene Artist, MD [facility] MEDICAL ONCOLOGY [phone] None     Future Orders Please Complete By Expires   Diet - low sodium heart healthy      Increase activity slowly      Discharge instructions      Comments:   Please discuss with Dr Herbie Baltimore (your cardiologist) ASAP  regarding coumadin especially now with your second fall.       DISPOSITION: Home DIET: Heart healthy ACTIVITY: Per physical therapy   DISCHARGE FOLLOW-UP Follow-up Information    Follow up with GRAVES,JOHN L, MD. Schedule an appointment as soon as possible for a visit in 2 weeks.   Contact information:   [address] [place] [address] [phone]       Follow up with HARDING,DAVID W, MD. Schedule an appointment as soon as possible for a visit in 7 days. (regarding coumadin)    Contact information:   [address] [place] [address] [phone]       Follow up with Gwen Pounds, MD. Schedule an appointment as soon as possible for a visit in 10 days. (for hospital follow-up)    Contact information:   [address] [facility] [place] [address] [phone]          Time spent on Discharge: 45 mins  Signed:   Ketina Mars M.D. [facility] 06/26/2012, 10:49 AM Pager: [phone]

## 2012-06-26 NOTE — Progress Notes (Signed)
Occupational Therapy Treatment Patient Details Name: Hayley [name] MRN: [id number] DOB: 08/29/1920 Today's Date: 06/26/2012 Time: 0865-7846 OT Time Calculation (min): 29 min  OT Assessment / Plan / Recommendation Comments on Treatment Session pt preparing in d/c home, dtrs educated and re educated on precautions, sling. Pt instructed not to ambulate at home without A and that sling should be worn at all times, except for bathing, dressing and ROM    Follow Up Recommendations  Other (comment)    Barriers to Discharge   none    Equipment Recommendations   None by OT   Recommendations for Other Services    Frequency Other (comment) (pt preparing for d/c home with her dtrs this afternoon)   Plan Discharge plan remains appropriate    Precautions / Restrictions Precautions Precautions: Shoulder;Fall Precaution Comments: Educated pt's other dtr, who she lives with, on shoulder precautions, sling wear/removal, ROM restrictions and PROM  Required Braces or Orthoses: Other Brace/Splint Other Brace/Splint: sling R UE Restrictions Weight Bearing Restrictions: Yes RUE Weight Bearing: Non weight bearing   Pertinent Vitals/Pain 1/10    ADL  Where Assessed - Upper Body Dressing: Other (comment) (caregivers Ind with task, donn/doff sling)    OT Diagnosis:    OT Problem List:   OT Treatment Interventions:     OT Goals ADL Goals ADL Goal: Grooming - Progress: Not met ADL Goal: Upper Body Bathing - Progress: Not met ADL Goal: Lower Body Bathing - Progress: Not met ADL Goal: Upper Body Dressing - Progress: Not met ADL Goal: Lower Body Dressing - Progress: Not met ADL Goal: Toilet Transfer - Progress: Not met ADL Goal: Toileting - Clothing Manipulation - Progress: Not met ADL Goal: Toileting - Hygiene - Progress: Not met  Visit Information  Last OT Received On: 06/26/12    Subjective Data  Subjective: " You mean I can't take this (sling) off when I get home " Patient Stated  Goal: To return home   Prior Functioning       Cognition  Overall Cognitive Status: Appears within functional limits for tasks assessed/performed Area of Impairment: Safety/judgement Arousal/Alertness: Awake/alert Orientation Level: Appears intact for tasks assessed Behavior During Session: [facility] for tasks performed Memory: Decreased recall of precautions Safety/Judgement: Decreased awareness of safety precautions Safety/Judgement - Other Comments: Both pt's dtrs present during education and review of precautions, sling. Pt's 2 sons sitting at table outside of room    Mobility  Shoulder Instructions     Donning/doffing shirt without moving shoulder: Caregiver independent with task Method for sponge bathing under operated UE: Caregiver independent with task Donning/doffing sling/immobilizer: Caregiver independent with task Correct positioning of sling/immobilizer: Caregiver independent with task ROM for elbow, wrist and digits of operated UE: Caregiver independent with task Sling wearing schedule (on at all times/off for ADL's): Caregiver independent with task Positioning of UE while sleeping: Caregiver independent with task   Exercises  Shoulder Exercises Elbow Flexion: AROM;AAROM;Right;10 reps;Seated Elbow Extension: AROM;AAROM;Right;10 reps;Seated Wrist Flexion: AROM;AAROM;Right;10 reps;Seated Wrist Extension: AROM;AAROM;Right;10 reps;Seated Digit Composite Flexion: AROM;AAROM;Right;10 reps;Seated Composite Extension: AROM;AAROM;Right;10 reps;Seated   Balance     End of Session OT - End of Session Activity Tolerance: Patient limited by pain Patient left: in chair;with call bell/phone within reach;with family/visitor present  GO     Galen Manila 06/26/2012, 1:57 PM

## 2012-06-27 LAB — URINE CULTURE: Culture: NO GROWTH

## 2012-07-16 ENCOUNTER — Encounter (HOSPITAL_COMMUNITY): Payer: Self-pay | Admitting: *Deleted

## 2012-07-16 ENCOUNTER — Ambulatory Visit (HOSPITAL_COMMUNITY): Payer: Medicare Other | Admitting: Anesthesiology

## 2012-07-16 ENCOUNTER — Ambulatory Visit (HOSPITAL_COMMUNITY)
Admission: RE | Admit: 2012-07-16 | Discharge: 2012-07-16 | Disposition: A | Discharge status: Home / Self Care | Payer: Medicare Other | Source: Ambulatory Visit | Attending: Orthopedic Surgery | Admitting: Orthopedic Surgery

## 2012-07-16 ENCOUNTER — Encounter (HOSPITAL_COMMUNITY)
Admission: RE | Disposition: A | Discharge status: Home / Self Care | Payer: Self-pay | Source: Ambulatory Visit | Attending: Orthopedic Surgery

## 2012-07-16 ENCOUNTER — Ambulatory Visit (HOSPITAL_COMMUNITY): Payer: Medicare Other

## 2012-07-16 ENCOUNTER — Encounter (HOSPITAL_COMMUNITY): Payer: Self-pay | Admitting: Anesthesiology

## 2012-07-16 DIAGNOSIS — S52023A Displaced fracture of olecranon process without intraarticular extension of unspecified ulna, initial encounter for closed fracture: Secondary | ICD-10-CM | POA: Insufficient documentation

## 2012-07-16 DIAGNOSIS — I10 Essential (primary) hypertension: Secondary | ICD-10-CM | POA: Insufficient documentation

## 2012-07-16 DIAGNOSIS — W19XXXA Unspecified fall, initial encounter: Secondary | ICD-10-CM | POA: Insufficient documentation

## 2012-07-16 DIAGNOSIS — S52031A Displaced fracture of olecranon process with intraarticular extension of right ulna, initial encounter for closed fracture: Secondary | ICD-10-CM

## 2012-07-16 DIAGNOSIS — D693 Immune thrombocytopenic purpura: Secondary | ICD-10-CM | POA: Insufficient documentation

## 2012-07-16 DIAGNOSIS — I4891 Unspecified atrial fibrillation: Secondary | ICD-10-CM | POA: Insufficient documentation

## 2012-07-16 DIAGNOSIS — E039 Hypothyroidism, unspecified: Secondary | ICD-10-CM | POA: Insufficient documentation

## 2012-07-16 HISTORY — DX: Hypothyroidism, unspecified: E03.9

## 2012-07-16 HISTORY — PX: ORIF ELBOW FRACTURE: SHX5031

## 2012-07-16 LAB — COMPREHENSIVE METABOLIC PANEL
ALT: 7 U/L (ref 0–35)
AST: 17 U/L (ref 0–37)
Albumin: 3.2 g/dL — ABNORMAL LOW (ref 3.5–5.2)
Alkaline Phosphatase: 117 U/L (ref 39–117)
BUN: 8 mg/dL (ref 6–23)
Chloride: 102 mEq/L (ref 96–112)
Potassium: 4.7 mEq/L (ref 3.5–5.1)
Sodium: 138 mEq/L (ref 135–145)
Total Bilirubin: 0.5 mg/dL (ref 0.3–1.2)
Total Protein: 6.8 g/dL (ref 6.0–8.3)

## 2012-07-16 LAB — URINALYSIS, ROUTINE W REFLEX MICROSCOPIC
Glucose, UA: NEGATIVE mg/dL
Hgb urine dipstick: NEGATIVE
Leukocytes, UA: NEGATIVE
Protein, ur: NEGATIVE mg/dL
Specific Gravity, Urine: 1.02 (ref 1.005–1.030)
pH: 6 (ref 5.0–8.0)

## 2012-07-16 LAB — CBC
HCT: 35.3 % — ABNORMAL LOW (ref 36.0–46.0)
MCHC: 32.9 g/dL (ref 30.0–36.0)
RDW: 15.3 % (ref 11.5–15.5)
WBC: 6.7 10*3/uL (ref 4.0–10.5)

## 2012-07-16 SURGERY — OPEN REDUCTION INTERNAL FIXATION (ORIF) ELBOW/OLECRANON FRACTURE
Anesthesia: Choice | Site: Elbow | Laterality: Right | Wound class: Clean

## 2012-07-16 MED ORDER — LACTATED RINGERS IV SOLN
INTRAVENOUS | Status: DC | PRN
  Administered 2012-07-16 (×2): via INTRAVENOUS

## 2012-07-16 MED ORDER — MIDAZOLAM HCL 2 MG/2ML IJ SOLN
INTRAMUSCULAR | Status: AC
  Filled 2012-07-16: qty 2

## 2012-07-16 MED ORDER — BUPIVACAINE-EPINEPHRINE PF 0.5-1:200000 % IJ SOLN
INTRAMUSCULAR | Status: DC | PRN
  Administered 2012-07-16: 25 mL

## 2012-07-16 MED ORDER — CEFAZOLIN SODIUM 1-5 GM-% IV SOLN
1.0000 g | Freq: Once | INTRAVENOUS | Status: DC
  Filled 2012-07-16: qty 50

## 2012-07-16 MED ORDER — PROPOFOL 10 MG/ML IV BOLUS
INTRAVENOUS | Status: DC | PRN
  Administered 2012-07-16: 50 mg via INTRAVENOUS
  Administered 2012-07-16: 150 mg via INTRAVENOUS

## 2012-07-16 MED ORDER — OXYCODONE-ACETAMINOPHEN 5-325 MG PO TABS: 1.0000 | ORAL_TABLET | Freq: Four times a day (QID) | ORAL | Status: DC | PRN

## 2012-07-16 MED ORDER — MIDAZOLAM HCL 5 MG/5ML IJ SOLN: INTRAMUSCULAR | Status: DC | PRN

## 2012-07-16 MED ORDER — DEXAMETHASONE SODIUM PHOSPHATE 4 MG/ML IJ SOLN
INTRAMUSCULAR | Status: DC | PRN
  Administered 2012-07-16: 4 mg

## 2012-07-16 MED ORDER — LACTATED RINGERS IV SOLN
INTRAVENOUS | Status: DC
  Administered 2012-07-16: 12:00:00 via INTRAVENOUS

## 2012-07-16 MED ORDER — FENTANYL CITRATE 0.05 MG/ML IJ SOLN
50.0000 ug | Freq: Once | INTRAMUSCULAR | Status: AC
  Administered 2012-07-16: 100 ug via INTRAVENOUS

## 2012-07-16 MED ORDER — MIDAZOLAM HCL 2 MG/2ML IJ SOLN: 1.0000 mg | INTRAMUSCULAR | Status: DC | PRN

## 2012-07-16 MED ORDER — ONDANSETRON HCL 4 MG/2ML IJ SOLN
INTRAMUSCULAR | Status: DC | PRN
  Administered 2012-07-16: 4 mg via INTRAVENOUS

## 2012-07-16 MED ORDER — CEFAZOLIN SODIUM-DEXTROSE 2-3 GM-% IV SOLR: 2.0000 g | INTRAVENOUS | Status: DC

## 2012-07-16 MED ORDER — FENTANYL CITRATE 0.05 MG/ML IJ SOLN: 50.0000 ug | Freq: Once | INTRAMUSCULAR | Status: DC

## 2012-07-16 MED ORDER — EPHEDRINE SULFATE 50 MG/ML IJ SOLN
INTRAMUSCULAR | Status: DC | PRN
  Administered 2012-07-16 (×2): 10 mg via INTRAVENOUS

## 2012-07-16 MED ORDER — 0.9 % SODIUM CHLORIDE (POUR BTL) OPTIME
TOPICAL | Status: DC | PRN
  Administered 2012-07-16: 1000 mL

## 2012-07-16 MED ORDER — MIDAZOLAM HCL 2 MG/2ML IJ SOLN
1.0000 mg | INTRAMUSCULAR | Status: DC | PRN
  Administered 2012-07-16: 12:00:00 via INTRAVENOUS

## 2012-07-16 MED ORDER — PROMETHAZINE HCL 25 MG/ML IJ SOLN: 6.2500 mg | INTRAMUSCULAR | Status: DC | PRN

## 2012-07-16 MED ORDER — FENTANYL CITRATE 0.05 MG/ML IJ SOLN
INTRAMUSCULAR | Status: DC | PRN
  Administered 2012-07-16: 100 ug via INTRAVENOUS

## 2012-07-16 MED ORDER — POVIDONE-IODINE 7.5 % EX SOLN
Freq: Once | CUTANEOUS | Status: DC
  Filled 2012-07-16: qty 118

## 2012-07-16 MED ORDER — LIDOCAINE HCL (CARDIAC) 20 MG/ML IV SOLN
INTRAVENOUS | Status: DC | PRN
  Administered 2012-07-16: 50 mg via INTRAVENOUS

## 2012-07-16 MED ORDER — ARTIFICIAL TEARS OP OINT
TOPICAL_OINTMENT | OPHTHALMIC | Status: DC | PRN
  Administered 2012-07-16: 1 via OPHTHALMIC

## 2012-07-16 MED ORDER — CEFAZOLIN SODIUM-DEXTROSE 2-3 GM-% IV SOLR
INTRAVENOUS | Status: AC
  Filled 2012-07-16: qty 50

## 2012-07-16 MED ORDER — HYDROMORPHONE HCL PF 1 MG/ML IJ SOLN: 0.2500 mg | INTRAMUSCULAR | Status: DC | PRN

## 2012-07-16 MED ORDER — FENTANYL CITRATE 0.05 MG/ML IJ SOLN
INTRAMUSCULAR | Status: AC
  Administered 2012-07-16: 100 ug via INTRAVENOUS
  Filled 2012-07-16: qty 2

## 2012-07-16 MED ORDER — SUCCINYLCHOLINE CHLORIDE 20 MG/ML IJ SOLN
INTRAMUSCULAR | Status: DC | PRN
  Administered 2012-07-16: 100 mg via INTRAVENOUS

## 2012-07-16 SURGICAL SUPPLY — 53 items
BANDAGE ELASTIC 3 VELCRO ST LF (GAUZE/BANDAGES/DRESSINGS) ×2 IMPLANT
BANDAGE ELASTIC 4 VELCRO ST LF (GAUZE/BANDAGES/DRESSINGS) ×2 IMPLANT
BANDAGE GAUZE ELAST BULKY 4 IN (GAUZE/BANDAGES/DRESSINGS) IMPLANT
BENZOIN TINCTURE PRP APPL 2/3 (GAUZE/BANDAGES/DRESSINGS) ×2 IMPLANT
BNDG COHESIVE 4X5 TAN STRL (GAUZE/BANDAGES/DRESSINGS) ×2 IMPLANT
BNDG ESMARK 4X9 LF (GAUZE/BANDAGES/DRESSINGS) ×2 IMPLANT
CLOSURE STERI-STRIP 1/4X4 (GAUZE/BANDAGES/DRESSINGS) ×2 IMPLANT
CLOTH BEACON ORANGE TIMEOUT ST (SAFETY) ×2 IMPLANT
CORDS BIPOLAR (ELECTRODE) ×2 IMPLANT
COVER MAYO STAND STRL (DRAPES) ×2 IMPLANT
COVER SURGICAL LIGHT HANDLE (MISCELLANEOUS) ×2 IMPLANT
CUFF TOURNIQUET SINGLE 18IN (TOURNIQUET CUFF) ×2 IMPLANT
CUFF TOURNIQUET SINGLE 24IN (TOURNIQUET CUFF) IMPLANT
DRAPE INCISE IOBAN 66X45 STRL (DRAPES) ×2 IMPLANT
DRAPE OEC MINIVIEW 54X84 (DRAPES) IMPLANT
DRSG PAD ABDOMINAL 8X10 ST (GAUZE/BANDAGES/DRESSINGS) ×2 IMPLANT
GAUZE XEROFORM 1X8 LF (GAUZE/BANDAGES/DRESSINGS) IMPLANT
GLOVE BIOGEL PI IND STRL 8 (GLOVE) ×2 IMPLANT
GLOVE BIOGEL PI INDICATOR 8 (GLOVE) ×2
GLOVE ECLIPSE 7.5 STRL STRAW (GLOVE) ×4 IMPLANT
GOWN PREVENTION PLUS XLARGE (GOWN DISPOSABLE) ×2 IMPLANT
GOWN SRG XL XLNG 56XLVL 4 (GOWN DISPOSABLE) ×1 IMPLANT
GOWN STRL NON-REIN LRG LVL3 (GOWN DISPOSABLE) ×2 IMPLANT
GOWN STRL NON-REIN XL XLG LVL4 (GOWN DISPOSABLE) ×1
K-Wire plain, 0.062. ×4 IMPLANT
KIT BASIN OR (CUSTOM PROCEDURE TRAY) ×2 IMPLANT
KIT ROOM TURNOVER OR (KITS) ×2 IMPLANT
LOOP VESSEL MAXI BLUE (MISCELLANEOUS) IMPLANT
MANIFOLD NEPTUNE II (INSTRUMENTS) ×2 IMPLANT
NEEDLE HYPO 25GX1X1/2 BEV (NEEDLE) IMPLANT
NS IRRIG 1000ML POUR BTL (IV SOLUTION) ×2 IMPLANT
PACK ORTHO EXTREMITY (CUSTOM PROCEDURE TRAY) ×2 IMPLANT
PAD ARMBOARD 7.5X6 YLW CONV (MISCELLANEOUS) ×4 IMPLANT
PAD CAST 4YDX4 CTTN HI CHSV (CAST SUPPLIES) ×1 IMPLANT
PADDING CAST COTTON 4X4 STRL (CAST SUPPLIES) ×1
SOLUTION BETADINE 4OZ (MISCELLANEOUS) ×2 IMPLANT
SPECIMEN JAR SMALL (MISCELLANEOUS) ×2 IMPLANT
SPLINT FIBERGLASS 4X30 (CAST SUPPLIES) ×2 IMPLANT
SPONGE GAUZE 4X4 12PLY (GAUZE/BANDAGES/DRESSINGS) ×2 IMPLANT
SPONGE SCRUB IODOPHOR (GAUZE/BANDAGES/DRESSINGS) ×2 IMPLANT
SUCTION FRAZIER TIP 10 FR DISP (SUCTIONS) IMPLANT
SUT FIBERWIRE #2 38 REV NDL BL (SUTURE) ×2
SUT MERSILENE 4 0 P 3 (SUTURE) IMPLANT
SUT PROLENE 4 0 PS 2 18 (SUTURE) IMPLANT
SUT VIC AB 2-0 CT1 27 (SUTURE)
SUT VIC AB 2-0 CT1 TAPERPNT 27 (SUTURE) IMPLANT
SUTURE FIBERWR#2 38 REV NDL BL (SUTURE) ×1 IMPLANT
SYR CONTROL 10ML LL (SYRINGE) IMPLANT
TOWEL OR 17X24 6PK STRL BLUE (TOWEL DISPOSABLE) ×2 IMPLANT
TOWEL OR 17X26 10 PK STRL BLUE (TOWEL DISPOSABLE) ×4 IMPLANT
TUBE CONNECTING 12X1/4 (SUCTIONS) IMPLANT
UNDERPAD 30X30 INCONTINENT (UNDERPADS AND DIAPERS) ×2 IMPLANT
WATER STERILE IRR 1000ML POUR (IV SOLUTION) ×2 IMPLANT

## 2012-07-16 NOTE — Transfer of Care (Signed)
Immediate Anesthesia Transfer of Care Note  Patient: Hayley [name]  Procedure(s) Performed: Procedure(s) (LRB) with comments: OPEN REDUCTION INTERNAL FIXATION (ORIF) ELBOW/OLECRANON FRACTURE (Right)  Patient Location: PACU  Anesthesia Type:General  Level of Consciousness: awake and sedated  Airway & Oxygen Therapy: Patient Spontanous Breathing and Patient connected to nasal cannula oxygen  Post-op Assessment: Report given to PACU RN, Post -op Vital signs reviewed and stable and Patient moving all extremities X 4  Post vital signs: Reviewed and stable  Complications: No apparent anesthesia complications

## 2012-07-16 NOTE — Anesthesia Preprocedure Evaluation (Signed)
Anesthesia Evaluation  Patient identified by MRN, date of birth, ID band Patient awake    Reviewed: Allergy & Precautions, H&P , NPO status , Patient's Chart, lab work & pertinent test results, reviewed documented beta blocker date and time   Airway Mallampati: II TM Distance: >3 FB Neck ROM: Full    Dental  (+) Dental Advisory Given and Teeth Intact   Pulmonary neg pulmonary ROS,  breath sounds clear to auscultation  Pulmonary exam normal       Cardiovascular hypertension, Pt. on medications and Pt. on home beta blockers + dysrhythmias Atrial Fibrillation Rhythm:Irregular Rate:Normal     Neuro/Psych negative neurological ROS  negative psych ROS   GI/Hepatic negative GI ROS, Neg liver ROS,   Endo/Other  Hypothyroidism   Renal/GU negative Renal ROS     Musculoskeletal  (+) Arthritis -, Osteoarthritis,    Abdominal (+) - obese,   Peds  Hematology  (+) Blood dyscrasia, , Chronic ITP   Anesthesia Other Findings   Reproductive/Obstetrics                           Anesthesia Physical Anesthesia Plan  ASA: III  Anesthesia Plan: General   Post-op Pain Management:    Induction: Intravenous  Airway Management Planned: Oral ETT  Additional Equipment:   Intra-op Plan:   Post-operative Plan: Extubation in OR  Informed Consent: I have reviewed the patients History and Physical, chart, labs and discussed the procedure including the risks, benefits and alternatives for the proposed anesthesia with the patient or authorized representative who has indicated his/her understanding and acceptance.     Plan Discussed with: CRNA and Surgeon  Anesthesia Plan Comments:         Anesthesia Quick Evaluation

## 2012-07-16 NOTE — Preoperative (Signed)
Beta Blockers   Reason not to administer Beta Blockers:Not Applicable 

## 2012-07-16 NOTE — Anesthesia Postprocedure Evaluation (Signed)
  Anesthesia Post-op Note  Patient: Hayley [name]  Procedure(s) Performed: Procedure(s) (LRB) with comments: OPEN REDUCTION INTERNAL FIXATION (ORIF) ELBOW/OLECRANON FRACTURE (Right)  Patient Location: PACU  Anesthesia Type:GA combined with regional for post-op pain  Level of Consciousness: awake  Airway and Oxygen Therapy: Patient Spontanous Breathing  Post-op Pain: none  Post-op Assessment: Post-op Vital signs reviewed, Patient's Cardiovascular Status Stable, Respiratory Function Stable, Patent Airway, No signs of Nausea or vomiting and Pain level controlled  Post-op Vital Signs: stable  Complications: No apparent anesthesia complications

## 2012-07-16 NOTE — Anesthesia Procedure Notes (Signed)
Anesthesia Regional Block:  Supraclavicular block  Pre-Anesthetic Checklist: ,, timeout performed, Correct Patient, Correct Site, Correct Laterality, Correct Procedure, Correct Position, site marked, Risks and benefits discussed,  Surgical consent,  Pre-op evaluation,  At surgeon's request and post-op pain management  Laterality: Right  Prep: chloraprep       Needles:  Injection technique: Single-shot  Needle Type: Echogenic Stimulator Needle     Needle Length: 5cm 5 cm Needle Gauge: 22 and 22 G    Additional Needles:  Procedures: ultrasound guided (picture in chart) and nerve stimulator Supraclavicular block  Nerve Stimulator or Paresthesia:  Response: 0.5 mA,   Additional Responses:   Narrative:  Start time: 07/16/2012 12:18 PM End time: 07/16/2012 12:25 PM Injection made incrementally with aspirations every 3 mL. Anesthesiologist: Dr Gypsy Balsam  Additional Notes: 4098-1191 R Supraclav N BlocPOP CHG prep, sterile tech US guidance with pix in chart NS at .5ma Multiple neg asp Vernia Buff .5% w/epi 1:200000 total 25cc+decadron 4mg  infiltrated No compl Dr Gypsy Balsam

## 2012-07-16 NOTE — Op Note (Signed)
NAME:  CLINT, STRUPP NO.:  [id number]  MEDICAL RECORD NO.:  [id number]  LOCATION:  [facility]                         FACILITY:  [facility]  PHYSICIAN:  Harvie Junior, M.D.   DATE OF BIRTH:  [birth year]  DATE OF PROCEDURE:  07/16/2012 DATE OF DISCHARGE:  07/16/2012                              OPERATIVE REPORT   PREOPERATIVE DIAGNOSIS:  Displaced olecranon fracture, right.  POSTOPERATIVE DIAGNOSIS:  Displaced olecranon fracture, right.  PROCEDURE:  Open reduction and internal fixation, right olecranon fracture with a tension band technique with two 0.62 K-wires and #2 FiberWire tension band.  SURGEON:  Harvie Junior, M.D.  ASSISTANT:  Marshia Ly, P.A.  ANESTHESIA:  General.  BRIEF HISTORY:  Ms. Buster is an 79 year old female with history of having a fall on her right arm.  She suffered a proximal humerus fracture 4-part with a head split.  We had evaluated her for this, CAT scanned for this and ultimately felt that we had evaluated her fully and then took her to the operating room for open reduction and internal fixation with a hemiarthroplasty of this process.  Once it was completed, she was discharged home was complaining of not much pain at all in the upper extremity.  We did not see her in the office until 3 weeks and that day she was still swollen in the elbow, really was not complaining of a lot of pain.  We x-rayed and unfortunately show a displaced olecranon fracture.  We talked about treatment options and felt that she was going to need as good an elbow she could get given that her shoulder was going to be unreliable in terms of its recovery of function.  At this point, she was brought to the operating room for open reduction and internal fixation.  DESCRIPTION OF PROCEDURE:  Patient was taken to the operating room and after adequate anesthesia was obtained with general anesthetic, the patient was placed supine on the operating table.  Right arm  was prepped and draped in sterile fashion.  Following this, an incision was made over the proximal olecranon, subcutaneous tissue down to the level of fracture.  The healing elements were removed and at this point towel clip was used.  Anatomic reduction was achievable.  At this point, two 0.62 K-wires were crossed across the fracture into the anterior cortex on the other side of the fracture.  We then used a tension band technique and got excellent fixation with a #2 K-wire.  Anatomic reduction was achieved.  We checked under fluoro.  The arm could be ranged at this point adequately.  At this point, the wound was copiously and thoroughly lavaged, suctioned dry.  The wound was then closed in layers.  Sterile compressive dressing was applied.  The patient was taken to the recovery room.  She was noted to be in satisfactory condition.  Estimated blood for procedure was none.  After wound closure, she was placed into a long-arm splint.  Of note, intraoperatively fluoro was used to assess the position and adequacy of the K-wires and tension band.     Harvie Junior, M.D.     Ranae Plumber  D:  07/16/2012  T:  07/16/2012  Job:  [id number]

## 2012-07-16 NOTE — Brief Op Note (Signed)
07/16/2012  1:43 PM  PATIENT:  Hayley [name]  79 y.o. female  PRE-OPERATIVE DIAGNOSIS:  Fractured Olecranon Right Elbow  POST-OPERATIVE DIAGNOSIS:  Fractured Olecranon Right Elbow  PROCEDURE:  Procedure(s) (LRB) with comments: OPEN REDUCTION INTERNAL FIXATION (ORIF) ELBOW/OLECRANON FRACTURE (Right)  SURGEON:  Surgeon(s) and Role:    * Harvie Junior, MD - Primary  PHYSICIAN ASSISTANT:   ASSISTANTS: bethune   ANESTHESIA:   general  EBL:  Total I/O In: 1000 [I.V.:1000] Out: -   BLOOD ADMINISTERED:none  DRAINS: none   LOCAL MEDICATIONS USED:  MARCAINE     SPECIMEN:  No Specimen  DISPOSITION OF SPECIMEN:  N/A  COUNTS:  YES  TOURNIQUET:  * Missing tourniquet times found for documented tourniquets in log:  16109 *  DICTATION: .Other Dictation: Dictation Number [id number]  PLAN OF CARE: Discharge to home after PACU  PATIENT DISPOSITION:  PACU - hemodynamically stable.   Delay start of Pharmacological VTE agent (>24hrs) due to surgical blood loss or risk of bleeding: no

## 2012-07-16 NOTE — H&P (Signed)
PREOPERATIVE H&P  Chief Complaint: r elbow pain  HPI: Hayley [name] is a 79 y.o. female who presents for evaluation of r elbow pain. It has been present for 3 weeks and has been worsening. She has failed conservative measures. Pain is rated as moderate.  Past Medical History  Diagnosis Date  . Chronic ITP (idiopathic thrombocytopenia)   . Atrial fibrillation with rapid ventricular response 11/2010  . Chronic edema     BLE  . Thyroid disease     Hypothyroid  . Hypertension     MD took pt off of meds 04/2012  . Hypothyroidism    Past Surgical History  Procedure Date  . Shoulder hemi-arthroplasty 06/23/2012    Procedure: SHOULDER HEMI-ARTHROPLASTY;  Surgeon: Harvie Junior, MD;  Location: [facility] OR;  Service: Orthopedics;  Laterality: Right;  . Abdominal hysterectomy     1998  . Cesarean section     1969  . Bunionectomy     2000   History   Social History  . Marital Status: Widowed    Spouse Name: N/A    Number of Children: N/A  . Years of Education: N/A   Social History Main Topics  . Smoking status: Never Smoker   . Smokeless tobacco: None  . Alcohol Use: No  . Drug Use: No  . Sexually Active: Not Currently   Other Topics Concern  . None   Social History Narrative  . None   Family History  Problem Relation Age of Onset  . Stomach cancer Mother   . Diabetes Mother   . Heart attack Father   . Leukemia Sister   . Stroke Brother   . Stroke Sister    Allergies  Allergen Reactions  . Sulfa Antibiotics Hives  . Ultram (Tramadol)     Hallucinations.   Prior to Admission medications   Medication Sig Start Date End Date Taking? Authorizing Provider  acetaminophen (TYLENOL) 500 MG tablet Take 1,000 mg by mouth every 6 (six) hours as needed. Pain   Yes Historical Provider, MD  alendronate (FOSAMAX) 70 MG tablet Take 70 mg by mouth every 30 (thirty) days. Take with a full glass of water on an empty stomach.   Yes Historical Provider, MD  ALPRAZolam (XANAX) 0.25  MG tablet Take 0.25 mg by mouth 3 (three) times daily as needed. For anxiety.   Yes Historical Provider, MD  atorvastatin (LIPITOR) 40 MG tablet Take 40 mg by mouth daily.   Yes Historical Provider, MD  BIOTIN PO Take 1 capsule by mouth daily.   Yes Historical Provider, MD  calcium-vitamin D (OSCAL WITH D) 500-200 MG-UNIT per tablet Take 1 tablet by mouth daily.   Yes Historical Provider, MD  cholecalciferol (VITAMIN D) 1000 UNITS tablet Take 1,000 Units by mouth daily.   Yes Historical Provider, MD  diltiazem (CARDIZEM CD) 360 MG 24 hr capsule Take 360 mg by mouth daily.    Yes Historical Provider, MD  docusate sodium (COLACE) 100 MG capsule Take 100 mg by mouth 2 (two) times daily as needed. Constipation   Yes Historical Provider, MD  levothyroxine (SYNTHROID, LEVOTHROID) 100 MCG tablet Take 100 mcg by mouth daily.     Yes Historical Provider, MD  metoprolol tartrate (LOPRESSOR) 25 MG tablet Take 25 mg by mouth 2 (two) times daily.   Yes Historical Provider, MD  Multiple Vitamin (MULTIVITAMIN WITH MINERALS) TABS Take 1 tablet by mouth daily.   Yes Historical Provider, MD  Multiple Vitamins-Minerals (ECHINACEA ACZ PO) Take by  mouth daily.     Yes Historical Provider, MD  oxyCODONE (OXY IR/ROXICODONE) 5 MG immediate release tablet Take 1 tablet (5 mg total) by mouth every 8 (eight) hours as needed for pain. 06/26/12  Yes Ripudeep Jenna Luo, MD  potassium chloride SA (K-DUR,KLOR-CON) 20 MEQ tablet Take 10 mEq by mouth daily.    Yes Historical Provider, MD  sertraline (ZOLOFT) 100 MG tablet Take 100 mg by mouth daily.     Yes Historical Provider, MD  vitamin C (ASCORBIC ACID) 500 MG tablet Take 500 mg by mouth daily.   Yes Historical Provider, MD     Positive ROS: none  All other systems have been reviewed and were otherwise negative with the exception of those mentioned in the HPI and as above.  Physical Exam: Filed Vitals:   07/16/12 1025  BP: 135/84  Pulse: 80  Temp: 98 F (36.7 C)  Resp: 18     General: Alert, no acute distress Cardiovascular: No pedal edema Respiratory: No cyanosis, no use of accessory musculature GI: No organomegaly, abdomen is soft and non-tender Skin: No lesions in the area of chief complaint Neurologic: Sensation intact distally Psychiatric: Patient is competent for consent with normal mood and affect Lymphatic: No axillary or cervical lymphadenopathy  MUSCULOSKELETAL: swollen and painful elbow r arm X-RAY: olecranon fracture displaced  Assessment/Plan: Fractured Olecranon Right Elbow Plan for Procedure(s): OPEN REDUCTION INTERNAL FIXATION (ORIF) ELBOW/OLECRANON FRACTURE  The risks benefits and alternatives were discussed with the patient including but not limited to the risks of nonoperative treatment, versus surgical intervention including infection, bleeding, nerve injury, malunion, nonunion, hardware prominence, hardware failure, need for hardware removal, blood clots, cardiopulmonary complications, morbidity, mortality, among others, and they were willing to proceed.  Predicted outcome is good, although there will be at least a six to nine month expected recovery.  Harvie Junior, MD 07/16/2012 12:10 PM

## 2012-07-18 ENCOUNTER — Encounter (HOSPITAL_COMMUNITY): Payer: Self-pay | Admitting: Orthopedic Surgery

## 2012-08-27 ENCOUNTER — Ambulatory Visit: Payer: Self-pay | Admitting: Cardiovascular Disease

## 2012-08-27 DIAGNOSIS — Z7901 Long term (current) use of anticoagulants: Secondary | ICD-10-CM | POA: Insufficient documentation

## 2012-10-24 ENCOUNTER — Telehealth: Payer: Self-pay | Admitting: Pharmacist Clinician (PhC)/ Clinical Pharmacy Specialist

## 2012-10-24 MED ORDER — WARFARIN SODIUM 5 MG PO TABS: ORAL_TABLET | ORAL | Status: DC

## 2012-10-24 NOTE — Telephone Encounter (Signed)
Dtr Waynetta Sandy called to say they would switch back to warfarin today, took last dose of Eliquis this AM, wanted to know dose and when to get INR checked.  Asked her to give 10mg  x 2, 7.5mg  x 2 then resume previous dose, will check INR next Friday.  Appointment made

## 2012-10-31 ENCOUNTER — Ambulatory Visit (INDEPENDENT_AMBULATORY_CARE_PROVIDER_SITE_OTHER): Payer: Medicare Other | Admitting: Pharmacist Clinician (PhC)/ Clinical Pharmacy Specialist

## 2012-10-31 VITALS — BP 122/80 | HR 68

## 2012-10-31 DIAGNOSIS — I4891 Unspecified atrial fibrillation: Secondary | ICD-10-CM

## 2012-10-31 DIAGNOSIS — Z7901 Long term (current) use of anticoagulants: Secondary | ICD-10-CM

## 2012-10-31 LAB — POCT INR: INR: 5.6

## 2012-11-10 ENCOUNTER — Ambulatory Visit (INDEPENDENT_AMBULATORY_CARE_PROVIDER_SITE_OTHER): Payer: Medicare Other | Admitting: Pharmacist Clinician (PhC)/ Clinical Pharmacy Specialist

## 2012-11-10 VITALS — BP 112/72 | HR 80

## 2012-11-10 DIAGNOSIS — I4891 Unspecified atrial fibrillation: Secondary | ICD-10-CM

## 2012-11-10 DIAGNOSIS — Z7901 Long term (current) use of anticoagulants: Secondary | ICD-10-CM

## 2012-12-01 ENCOUNTER — Ambulatory Visit (INDEPENDENT_AMBULATORY_CARE_PROVIDER_SITE_OTHER): Payer: Medicare Other | Admitting: Pharmacist Clinician (PhC)/ Clinical Pharmacy Specialist

## 2012-12-01 VITALS — BP 100/60 | HR 68

## 2012-12-01 DIAGNOSIS — Z7901 Long term (current) use of anticoagulants: Secondary | ICD-10-CM

## 2012-12-01 DIAGNOSIS — I4891 Unspecified atrial fibrillation: Secondary | ICD-10-CM

## 2012-12-01 LAB — POCT INR: INR: 3.3

## 2012-12-22 ENCOUNTER — Ambulatory Visit (INDEPENDENT_AMBULATORY_CARE_PROVIDER_SITE_OTHER): Payer: Medicare Other | Admitting: Pharmacist Clinician (PhC)/ Clinical Pharmacy Specialist

## 2012-12-22 VITALS — BP 96/60 | HR 68

## 2012-12-22 DIAGNOSIS — Z7901 Long term (current) use of anticoagulants: Secondary | ICD-10-CM

## 2012-12-22 DIAGNOSIS — I4891 Unspecified atrial fibrillation: Secondary | ICD-10-CM

## 2013-01-05 ENCOUNTER — Other Ambulatory Visit: Payer: Self-pay | Admitting: Cardiology

## 2013-01-05 NOTE — Telephone Encounter (Signed)
Rx was sent to pharmacy electronically. 

## 2013-01-14 ENCOUNTER — Other Ambulatory Visit: Payer: Self-pay

## 2013-01-19 ENCOUNTER — Ambulatory Visit (INDEPENDENT_AMBULATORY_CARE_PROVIDER_SITE_OTHER): Payer: Medicare Other | Admitting: Pharmacist Clinician (PhC)/ Clinical Pharmacy Specialist

## 2013-01-19 VITALS — BP 120/76 | HR 72

## 2013-01-19 DIAGNOSIS — Z7901 Long term (current) use of anticoagulants: Secondary | ICD-10-CM

## 2013-01-19 DIAGNOSIS — I4891 Unspecified atrial fibrillation: Secondary | ICD-10-CM

## 2013-02-05 ENCOUNTER — Other Ambulatory Visit: Payer: Self-pay | Admitting: Pharmacist Clinician (PhC)/ Clinical Pharmacy Specialist

## 2013-02-05 MED ORDER — DILTIAZEM HCL ER BEADS 360 MG PO CP24: 360.0000 mg | ORAL_CAPSULE | Freq: Every day | ORAL | Status: DC

## 2013-02-16 ENCOUNTER — Ambulatory Visit: Payer: Self-pay | Admitting: Pharmacist Clinician (PhC)/ Clinical Pharmacy Specialist

## 2013-02-19 ENCOUNTER — Ambulatory Visit (INDEPENDENT_AMBULATORY_CARE_PROVIDER_SITE_OTHER): Payer: Medicare Other | Admitting: Pharmacist Clinician (PhC)/ Clinical Pharmacy Specialist

## 2013-02-19 ENCOUNTER — Other Ambulatory Visit: Payer: Self-pay | Admitting: Pharmacist Clinician (PhC)/ Clinical Pharmacy Specialist

## 2013-02-19 VITALS — BP 112/70 | HR 68

## 2013-02-19 DIAGNOSIS — Z7901 Long term (current) use of anticoagulants: Secondary | ICD-10-CM

## 2013-02-19 DIAGNOSIS — I4891 Unspecified atrial fibrillation: Secondary | ICD-10-CM

## 2013-02-19 LAB — POCT INR: INR: 3

## 2013-02-19 MED ORDER — TIAZAC 360 MG PO CP24: 360.0000 mg | ORAL_CAPSULE | Freq: Every day | ORAL | Status: DC

## 2013-03-11 DIAGNOSIS — S329XXA Fracture of unspecified parts of lumbosacral spine and pelvis, initial encounter for closed fracture: Secondary | ICD-10-CM

## 2013-03-11 HISTORY — DX: Fracture of unspecified parts of lumbosacral spine and pelvis, initial encounter for closed fracture: S32.9XXA

## 2013-03-19 ENCOUNTER — Ambulatory Visit (INDEPENDENT_AMBULATORY_CARE_PROVIDER_SITE_OTHER): Payer: Medicare Other | Admitting: Pharmacist Clinician (PhC)/ Clinical Pharmacy Specialist

## 2013-03-19 VITALS — BP 148/90 | HR 64

## 2013-03-19 DIAGNOSIS — Z7901 Long term (current) use of anticoagulants: Secondary | ICD-10-CM

## 2013-03-19 DIAGNOSIS — I4891 Unspecified atrial fibrillation: Secondary | ICD-10-CM

## 2013-03-23 ENCOUNTER — Encounter: Payer: Self-pay | Admitting: *Deleted

## 2013-03-24 ENCOUNTER — Ambulatory Visit: Payer: Self-pay | Admitting: Cardiology

## 2013-03-25 ENCOUNTER — Emergency Department (HOSPITAL_COMMUNITY): Payer: Medicare Other

## 2013-03-25 ENCOUNTER — Emergency Department (HOSPITAL_COMMUNITY)
Admission: EM | Admit: 2013-03-25 | Discharge: 2013-03-25 | Disposition: A | Discharge status: Home / Self Care | Payer: Medicare Other | Attending: Emergency Medicine | Admitting: Emergency Medicine

## 2013-03-25 ENCOUNTER — Encounter (HOSPITAL_COMMUNITY): Payer: Self-pay | Admitting: Emergency Medicine

## 2013-03-25 DIAGNOSIS — I4891 Unspecified atrial fibrillation: Secondary | ICD-10-CM | POA: Insufficient documentation

## 2013-03-25 DIAGNOSIS — E039 Hypothyroidism, unspecified: Secondary | ICD-10-CM | POA: Insufficient documentation

## 2013-03-25 DIAGNOSIS — D693 Immune thrombocytopenic purpura: Secondary | ICD-10-CM | POA: Insufficient documentation

## 2013-03-25 DIAGNOSIS — Y9389 Activity, other specified: Secondary | ICD-10-CM | POA: Insufficient documentation

## 2013-03-25 DIAGNOSIS — S32509A Unspecified fracture of unspecified pubis, initial encounter for closed fracture: Secondary | ICD-10-CM | POA: Insufficient documentation

## 2013-03-25 DIAGNOSIS — S32592A Other specified fracture of left pubis, initial encounter for closed fracture: Secondary | ICD-10-CM

## 2013-03-25 DIAGNOSIS — I10 Essential (primary) hypertension: Secondary | ICD-10-CM | POA: Insufficient documentation

## 2013-03-25 DIAGNOSIS — Y92009 Unspecified place in unspecified non-institutional (private) residence as the place of occurrence of the external cause: Secondary | ICD-10-CM | POA: Insufficient documentation

## 2013-03-25 DIAGNOSIS — S0990XA Unspecified injury of head, initial encounter: Secondary | ICD-10-CM | POA: Insufficient documentation

## 2013-03-25 DIAGNOSIS — W1809XA Striking against other object with subsequent fall, initial encounter: Secondary | ICD-10-CM | POA: Insufficient documentation

## 2013-03-25 DIAGNOSIS — W010XXA Fall on same level from slipping, tripping and stumbling without subsequent striking against object, initial encounter: Secondary | ICD-10-CM | POA: Insufficient documentation

## 2013-03-25 DIAGNOSIS — S8990XA Unspecified injury of unspecified lower leg, initial encounter: Secondary | ICD-10-CM | POA: Insufficient documentation

## 2013-03-25 DIAGNOSIS — Z79899 Other long term (current) drug therapy: Secondary | ICD-10-CM | POA: Insufficient documentation

## 2013-03-25 DIAGNOSIS — Z7901 Long term (current) use of anticoagulants: Secondary | ICD-10-CM | POA: Insufficient documentation

## 2013-03-25 MED ORDER — HYDROCODONE-ACETAMINOPHEN 5-325 MG PO TABS
1.0000 | ORAL_TABLET | Freq: Once | ORAL | Status: AC
  Administered 2013-03-25: 1 via ORAL
  Filled 2013-03-25: qty 1

## 2013-03-25 MED ORDER — HYDROCODONE-ACETAMINOPHEN 5-325 MG PO TABS: 0.5000 | ORAL_TABLET | Freq: Four times a day (QID) | ORAL | Status: DC | PRN

## 2013-03-25 NOTE — ED Notes (Signed)
Per EMS: Fall earlier today around 1400 at home. Pt got up but noticed pain to inside of thigh with weight-bearing. No pain sitting or lying down.

## 2013-03-25 NOTE — ED Notes (Signed)
Pt gone for CT 

## 2013-03-25 NOTE — ED Notes (Signed)
Bed: WA08 Expected date: 03/25/13 Expected time: 8:54 PM Means of arrival: Ambulance Comments: Bed 8, EMS, 89 F, Fall

## 2013-03-25 NOTE — ED Provider Notes (Signed)
CSN: [id number]     Arrival date & time 03/25/13  2059 History   First MD Initiated Contact with Patient 03/25/13 2105     Chief Complaint  Patient presents with  . Fall   (Consider location/radiation/quality/duration/timing/severity/associated sxs/prior Treatment) Patient is a 79 y.o. female presenting with fall. The history is provided by the patient.  Fall This is a new (Patient was throwing trash out of her house today and slipped falling backwards hitting her head and her leg on the ground. She denies any LOC, no headaches but does complain of pain in the left leg.) problem. The current episode started 6 to 12 hours ago. The problem occurs constantly. The problem has not changed since onset.Pertinent negatives include no chest pain, no abdominal pain, no headaches and no shortness of breath. Associated symptoms comments: Left leg pain that is severe when trying to walk.  Since the fall she has not been able to ambulate due to severe pain in the left leg when standing. The symptoms are aggravated by walking and standing. The symptoms are relieved by rest. She has tried rest for the symptoms. The treatment provided no relief.    Past Medical History  Diagnosis Date  . Chronic ITP (idiopathic thrombocytopenia)   . Chronic edema     BLE  . Thyroid disease     Hypothyroid  . Hypertension     MD took pt off of meds 04/2012  . Hypothyroidism   . Atrial fibrillation with rapid ventricular response 11/2010  . Afib     chronic   Past Surgical History  Procedure Laterality Date  . Shoulder hemi-arthroplasty  06/23/2012    Procedure: SHOULDER HEMI-ARTHROPLASTY;  Surgeon: Harvie Junior, MD;  Location: [facility] OR;  Service: Orthopedics;  Laterality: Right;  . Abdominal hysterectomy      1998  . Cesarean section      1969  . Bunionectomy      2000  . Orif elbow fracture  07/16/2012    Procedure: OPEN REDUCTION INTERNAL FIXATION (ORIF) ELBOW/OLECRANON FRACTURE;  Surgeon: Harvie Junior, MD;   Location: [facility] OR;  Service: Orthopedics;  Laterality: Right;  . Doppler echocardiography  12/2010     mild coccentric LVH but normal diastolic function of age. mild to moderate mtral regurg. moderately dilated right atrium and left atrium.   . Doppler echocardiography    . Event monitor  12/16/2010-01/10/2011    afib c-occ rvr   Family History  Problem Relation Age of Onset  . Stomach cancer Mother   . Diabetes Mother   . Heart attack Father   . Leukemia Sister   . Stroke Brother   . Stroke Sister    History  Substance Use Topics  . Smoking status: Never Smoker   . Smokeless tobacco: Not on file  . Alcohol Use: No   OB History   Grav Para Term Preterm Abortions TAB SAB Ect Mult Living                 Review of Systems  Respiratory: Negative for cough and shortness of breath.   Cardiovascular: Negative for chest pain.  Gastrointestinal: Negative for abdominal pain.  Neurological: Negative for dizziness, syncope and headaches.  All other systems reviewed and are negative.    Allergies  Sulfa antibiotics and Ultram  Home Medications   Current Outpatient Rx  Name  Route  Sig  Dispense  Refill  . acetaminophen (TYLENOL) 500 MG tablet   Oral   Take 1,000  mg by mouth every 6 (six) hours as needed. Pain         . alendronate (FOSAMAX) 70 MG tablet   Oral   Take 70 mg by mouth every 30 (thirty) days. Take with a full glass of water on an empty stomach.         . ALPRAZolam (XANAX) 0.25 MG tablet   Oral   Take 0.25 mg by mouth 3 (three) times daily as needed. For anxiety.         Marland Kitchen atorvastatin (LIPITOR) 40 MG tablet   Oral   Take 40 mg by mouth daily.         Marland Kitchen BIOTIN PO   Oral   Take 1 capsule by mouth daily.         . calcium-vitamin D (OSCAL WITH D) 500-200 MG-UNIT per tablet   Oral   Take 1 tablet by mouth daily.         . cholecalciferol (VITAMIN D) 1000 UNITS tablet   Oral   Take 1,000 Units by mouth daily.         Marland Kitchen docusate sodium  (COLACE) 100 MG capsule   Oral   Take 100 mg by mouth 2 (two) times daily as needed. Constipation         . levothyroxine (SYNTHROID, LEVOTHROID) 100 MCG tablet   Oral   Take 100 mcg by mouth daily.           . metoprolol tartrate (LOPRESSOR) 25 MG tablet      TAKE 1 TABLET TWICE A DAY   60 tablet   9   . Multiple Vitamin (MULTIVITAMIN WITH MINERALS) TABS   Oral   Take 1 tablet by mouth daily.         . Multiple Vitamins-Minerals (ECHINACEA ACZ PO)   Oral   Take by mouth daily.           Marland Kitchen oxyCODONE (OXY IR/ROXICODONE) 5 MG immediate release tablet   Oral   Take 1 tablet (5 mg total) by mouth every 8 (eight) hours as needed for pain.   30 tablet   0   . oxyCODONE-acetaminophen (PERCOCET/ROXICET) 5-325 MG per tablet   Oral   Take 1-2 tablets by mouth every 6 (six) hours as needed for pain.   40 tablet   0   . potassium chloride SA (K-DUR,KLOR-CON) 20 MEQ tablet   Oral   Take 10 mEq by mouth daily.          . sertraline (ZOLOFT) 100 MG tablet   Oral   Take 100 mg by mouth daily.           Marland Kitchen TIAZAC 360 MG 24 hr capsule   Oral   Take 1 capsule (360 mg total) by mouth daily.   30 capsule   6     Dispense as written.   . vitamin C (ASCORBIC ACID) 500 MG tablet   Oral   Take 500 mg by mouth daily.         Marland Kitchen warfarin (COUMADIN) 5 MG tablet      Take 1/2-1 tablet by mouth daily as directed by coumadin clinic   30 tablet   6    BP 113/63  Pulse 54  Temp(Src) 97.3 F (36.3 C) (Oral)  Resp 18  SpO2 96% Physical Exam  Nursing note and vitals reviewed. Constitutional: She is oriented to person, place, and time. She appears well-developed and well-nourished. No distress.  HENT:  Head:  Normocephalic and atraumatic.  Mouth/Throat: Oropharynx is clear and moist.  No signs of contusion over the posterior part of the scalp where patient fell  Eyes: Conjunctivae and EOM are normal. Pupils are equal, round, and reactive to light.  Neck: Normal range  of motion. Neck supple. No spinous process tenderness and no muscular tenderness present.  Cardiovascular: Normal rate, regular rhythm and intact distal pulses.   No murmur heard. Pulmonary/Chest: Effort normal and breath sounds normal. No respiratory distress. She has no wheezes. She has no rales.  Abdominal: Soft. She exhibits no distension. There is no tenderness. There is no rebound and no guarding.  Musculoskeletal: Normal range of motion. She exhibits no edema and no tenderness.       Left hip: Normal. She exhibits normal range of motion and normal strength.       Left knee: Normal.  Full ROM of the left hip without pain.  No pain with palpation of the femur  Neurological: She is alert and oriented to person, place, and time.  Skin: Skin is warm and dry. No rash noted. No erythema.  Psychiatric: She has a normal mood and affect. Her behavior is normal.    ED Course  Procedures (including critical care time) Labs Review Labs Reviewed  PROTIME-INR - Abnormal; Notable for the following:    Prothrombin Time 20.5 (*)    INR 1.82 (*)    All other components within normal limits   Imaging Review Dg Hip Complete Left  03/25/2013   CLINICAL DATA:  Fall, left hip pain.  EXAM: LEFT HIP - COMPLETE 2+ VIEW  COMPARISON:  None.  FINDINGS: There is no evidence of hip fracture or dislocation. There is no evidence of arthropathy or other focal bone abnormality. Osseous demineralization. Lumbar spondylosis. Vascular calcification.  IMPRESSION: No evidence for hip fracture.   Electronically Signed   By: Davonna Belling M.D.   On: 03/25/2013 21:57   Ct Head Wo Contrast  03/25/2013   CLINICAL DATA:  Fall, on Coumadin, question intracranial hemorrhage  EXAM: CT HEAD WITHOUT CONTRAST  TECHNIQUE: Contiguous axial images were obtained from the base of the skull through the vertex without intravenous contrast.  COMPARISON:  12/21/2011  FINDINGS: Generalized atrophy.  Normal ventricular morphology.  No midline  shift or mass effect.  Small vessel chronic ischemic changes of deep cerebral white matter.  No intracranial hemorrhage, mass lesion, or acute infarction.  Small amount of fluid or mucus within the right sphenoid sinus.  Visualized paranasal sinuses and mastoid air cells otherwise clear.  Bones unremarkable.  IMPRESSION: No acute intracranial abnormalities.  Atrophy with small vessel chronic ischemic changes of deep cerebral white matter.   Electronically Signed   By: Ulyses Southward M.D.   On: 03/25/2013 21:44   Ct Hip Left Wo Contrast  03/25/2013   CLINICAL DATA:  Evaluate for occult fracture. Fall and left hip pain.  EXAM: CT OF THE LEFT HIP WITHOUT CONTRAST  TECHNIQUE: Multidetector CT imaging was performed according to the standard protocol. Multiplanar CT image reconstructions were also generated.  COMPARISON:  Hip x-ray from earlier the same day  FINDINGS: Segmental, nondisplaced fracturing of the inferior left pubic ramus. Anteriorly, there is evidence of remote inferior pubic ramus fracture. The proximal left femur and acetabulum show no evidence of fracture. No evident joint effusion.  Advanced degenerative changes at the symphysis pubis with narrowing and sclerosis.  Incidental left groin hernia, likely inguinal, containing bowel. No indication of bowel obstruction. Extensive colonic diverticulosis.  IMPRESSION: 1. Nondisplaced inferior left pubic ramus fracture. 2. No evidence of left hip fracture. 3. Left inguinal hernia containing nonobstructed bowel, likely sigmoid colon.   Electronically Signed   By: Tiburcio Pea M.D.   On: 03/25/2013 23:10   Dg Knee Complete 4 Views Left  03/25/2013   CLINICAL DATA:  Fall, pain  EXAM: LEFT KNEE - COMPLETE 4+ VIEW  COMPARISON:  None.  FINDINGS: There is no evidence of fracture, dislocation, or joint effusion. There is severe patellofemoral joint space narrowing and moderate medial and lateral compartment joint space narrowing with cartilage calcification.  Vascular calcification is noted. Loose bodies are seen in the suprapatellar bursa. No acute fracture is evident. Osseous demineralization.  IMPRESSION: Negative for fracture.   Electronically Signed   By: Davonna Belling M.D.   On: 03/25/2013 21:55    EKG Interpretation   None       MDM   1. Inferior pubic ramus fracture, left, closed, initial encounter     Patient with mechanical fall at home she fell backwards hitting her head without LOC the patient is on Coumadin. Since the fall 6 hours ago she's had severe pain in the left hip and knee when trying to ambulate. She has no pain with rest and is able to completely ranged the knee and hip without tenderness. Concern for possible acetabular fracture. Also due to being on Coumadin and having a level checked last week and it was elevated we'll do a head CT to ensure no intracranial bleeding.  10:20 PM Films negative and Coumadin level is subtherapeutic at 1.8. We'll do a CT of the left hip to evaluate for occult injury  11:23 PM Patient with inferior pubic rami fracture on the left.  This would explain her pain. Patient does have a walker and a cane at home. She did not take the Vicodin here because she states she is not hurting when she is laying down. However she did take half of Vicodin when she had her shoulder surgery and states that worked well for her pain. Patient lives with family who can ensure that she can safely maneuver around the house. Patient was prescribed Vicodin and will followup with [facility]  Gwyneth Sprout, MD 03/25/13 2344

## 2013-04-02 ENCOUNTER — Ambulatory Visit (INDEPENDENT_AMBULATORY_CARE_PROVIDER_SITE_OTHER): Payer: Medicare Other | Admitting: Pharmacist Clinician (PhC)/ Clinical Pharmacy Specialist

## 2013-04-02 VITALS — BP 102/76 | HR 72

## 2013-04-02 DIAGNOSIS — I4891 Unspecified atrial fibrillation: Secondary | ICD-10-CM

## 2013-04-02 DIAGNOSIS — Z7901 Long term (current) use of anticoagulants: Secondary | ICD-10-CM

## 2013-04-02 LAB — POCT INR: INR: 1.4

## 2013-04-16 ENCOUNTER — Other Ambulatory Visit: Payer: Self-pay

## 2013-04-16 ENCOUNTER — Ambulatory Visit (HOSPITAL_BASED_OUTPATIENT_CLINIC_OR_DEPARTMENT_OTHER): Payer: Medicare Other | Admitting: Oncology

## 2013-04-16 ENCOUNTER — Other Ambulatory Visit (HOSPITAL_BASED_OUTPATIENT_CLINIC_OR_DEPARTMENT_OTHER): Payer: Medicare Other | Admitting: Lab

## 2013-04-16 VITALS — BP 109/66 | HR 77 | Temp 97.7°F | Resp 20 | Ht 65.0 in | Wt 145.2 lb

## 2013-04-16 DIAGNOSIS — D693 Immune thrombocytopenic purpura: Secondary | ICD-10-CM

## 2013-04-16 DIAGNOSIS — Z7901 Long term (current) use of anticoagulants: Secondary | ICD-10-CM

## 2013-04-16 DIAGNOSIS — D696 Thrombocytopenia, unspecified: Secondary | ICD-10-CM

## 2013-04-16 DIAGNOSIS — I4891 Unspecified atrial fibrillation: Secondary | ICD-10-CM

## 2013-04-16 LAB — CBC WITH DIFFERENTIAL/PLATELET
Basophils Absolute: 0 10*3/uL (ref 0.0–0.1)
Eosinophils Absolute: 0.3 10*3/uL (ref 0.0–0.5)
HCT: 38.8 % (ref 34.8–46.6)
HGB: 12.7 g/dL (ref 11.6–15.9)
LYMPH%: 22 % (ref 14.0–49.7)
MCHC: 32.7 g/dL (ref 31.5–36.0)
MCV: 89.4 fL (ref 79.5–101.0)
MONO#: 0.8 10*3/uL (ref 0.1–0.9)
NEUT#: 7.7 10*3/uL — ABNORMAL HIGH (ref 1.5–6.5)
NEUT%: 68 % (ref 38.4–76.8)
Platelets: 184 10*3/uL (ref 145–400)
WBC: 11.4 10*3/uL — ABNORMAL HIGH (ref 3.9–10.3)
lymph#: 2.5 10*3/uL (ref 0.9–3.3)

## 2013-04-16 NOTE — Progress Notes (Signed)
   [facility]    OFFICE PROGRESS NOTE   INTERVAL HISTORY:   She returns as scheduled. She reports multiple recent falls including a right olecranon fracture that required surgery. No fever or night sweats. She is now using a cane for ambulation. She has received an influenza vaccine.  Objective:  Vital signs in last 24 hours:  Blood pressure 109/66, pulse 77, temperature 97.7 F (36.5 C), temperature source Oral, resp. rate 20, height 5\' 5"  (1.651 m), weight 145 lb 3.2 oz (65.862 kg).    HEENT: Neck without mass Lymphatics: No cervical, supraclavicular, axillary, or inguinal nodes Resp: Lungs clear bilaterally Cardio: Irregular GI: No hepatosplenic Vascular: Pitting edema at the low leg and ankle   Lab Results:  Lab Results  Component Value Date   WBC 11.4* 04/16/2013   HGB 12.7 04/16/2013   HCT 38.8 04/16/2013   MCV 89.4 04/16/2013   PLT 184 04/16/2013   ANC 7.7    Medications: I have reviewed the patient's current medications.  Assessment/Plan: 1.Thrombocytopenia-the platelet count is in the normal range today. 2. Anticoagulation-Ms. Doyle is now maintained on Coumadin anticoagulation for atrial fibrillation. The Coumadin is followed through her cardiologist's office.  3. Atrial fibrillation.  Disposition:  She appears stable from a hematologic standpoint. She has a history of chronic thrombocytopenia, likely related to chronic ITP. She plans to continue clinical followup with Dr. Timothy Lasso. We did not schedule a followup appointment in the Hematology clinic. We will be glad to see her in the future as needed.   Thornton Papas, MD  04/16/2013  3:31 PM

## 2013-04-20 ENCOUNTER — Telehealth: Payer: Self-pay | Admitting: Oncology

## 2013-04-20 NOTE — Telephone Encounter (Signed)
Per 11/6 pof f/u TBA

## 2013-04-21 ENCOUNTER — Encounter: Payer: Self-pay | Admitting: Cardiology

## 2013-04-22 ENCOUNTER — Ambulatory Visit (INDEPENDENT_AMBULATORY_CARE_PROVIDER_SITE_OTHER): Payer: Medicare Other | Admitting: Cardiology

## 2013-04-22 ENCOUNTER — Encounter: Payer: Self-pay | Admitting: Cardiology

## 2013-04-22 ENCOUNTER — Ambulatory Visit (INDEPENDENT_AMBULATORY_CARE_PROVIDER_SITE_OTHER): Payer: Medicare Other | Admitting: Pharmacist Clinician (PhC)/ Clinical Pharmacy Specialist

## 2013-04-22 VITALS — BP 90/60 | HR 67 | Ht 65.0 in | Wt 147.7 lb

## 2013-04-22 DIAGNOSIS — T50904A Poisoning by unspecified drugs, medicaments and biological substances, undetermined, initial encounter: Secondary | ICD-10-CM

## 2013-04-22 DIAGNOSIS — R29818 Other symptoms and signs involving the nervous system: Secondary | ICD-10-CM

## 2013-04-22 DIAGNOSIS — I952 Hypotension due to drugs: Secondary | ICD-10-CM

## 2013-04-22 DIAGNOSIS — Z7901 Long term (current) use of anticoagulants: Secondary | ICD-10-CM

## 2013-04-22 DIAGNOSIS — R2689 Other abnormalities of gait and mobility: Secondary | ICD-10-CM

## 2013-04-22 DIAGNOSIS — I9589 Other hypotension: Secondary | ICD-10-CM

## 2013-04-22 DIAGNOSIS — I4891 Unspecified atrial fibrillation: Secondary | ICD-10-CM

## 2013-04-22 DIAGNOSIS — R600 Localized edema: Secondary | ICD-10-CM

## 2013-04-22 DIAGNOSIS — R609 Edema, unspecified: Secondary | ICD-10-CM

## 2013-04-22 MED ORDER — DILTIAZEM HCL ER 120 MG PO CP12: 120.0000 mg | ORAL_CAPSULE | Freq: Two times a day (BID) | ORAL | Status: DC

## 2013-04-22 NOTE — Patient Instructions (Addendum)
CHANGE DILTIAZEM 360 MG  TO diltiazem 120 mg twice a day . If still feeling fatigue or low bloodpressure you can hold a dose of diltiazem  Take 2 tablet of Lasix day  2 to 3 times a week .   If heart rate goes fast Dr Herbie Baltimore will try a medication called DIGOXIN/Lanoxin    Your physician wants you to follow-up in 6 month Dr Herbie Baltimore.  You will receive a reminder letter in the mail two months in advance. If you don't receive a letter, please call our office to schedule the follow-up appointment.

## 2013-04-22 NOTE — Progress Notes (Signed)
PATIENT: Hayley [name] MRN: [id number]  DOB: 1920-12-27   DOV:04/24/2013 PCP: Gwen Pounds, MD  Clinic Note: Chief Complaint  Patient presents with  . 6 MONTH VISIT    no chest pain , no sob, swelling nothing new, use a 4 point cane    HPI: Hayley [name] is a 79 y.o.  female with a PMH below who presents today for this month followup of her atrial fibrillation. She was first diagnosed with A. fib back in 2012. She is found to be in A. fib with RVR after a fall tripping on the sidewalk. She has been anticoagulated with warfarin and rate control for quite some time. She has chronic lower cavity edema which is stable. Otherwise her cardiac history is stable with well-controlled blood pressure and essentially normal echocardiographic findings..  Interval History: The main event this happened between now and her last visit he's had a fall that led to a pelvic fracture. She is now somewhat limited in mobility as she is recovering from his condition. Otherwise from a cardiac standpoint she is very stable. She does not have any recollection whatsoever of being in atrial fibrillation. She did note when she was going fast, but when she is rate controlled she has no symptoms. When her rate went fast she gets dyspneic, but usually is not so she is not dyspneic. She does not have any chest discomfort/pressure with rest or exertion. No dyspnea at rest or exertion. No PND or orthopnea although she does have chronic lower extremity edema. No TIA or amaurosis fugax symptoms. Her pelvis does feel better. Starting to walk again now. PPI she melena, hematochezia or hematuria. No nosebleeds.  Past Medical History  Diagnosis Date  . Chronic ITP (idiopathic thrombocytopenia)   . Chronic edema     BLE  . Thyroid disease     Hypothyroid  . Hypertension     MD took pt off of meds 04/2012  . Hypothyroidism   . Pelvic fracture October 2014    Recent fall  . Chronic atrial fibrillation     History of RVR  June 2012    Prior Cardiac Evaluation and Past Surgical History: Past Surgical History  Procedure Laterality Date  . Shoulder hemi-arthroplasty  06/23/2012    Procedure: SHOULDER HEMI-ARTHROPLASTY;  Surgeon: Harvie Junior, MD;  Location: [facility] OR;  Service: Orthopedics;  Laterality: Right;  . Abdominal hysterectomy      1998  . Cesarean section      1969  . Bunionectomy      2000  . Orif elbow fracture  07/16/2012    Procedure: OPEN REDUCTION INTERNAL FIXATION (ORIF) ELBOW/OLECRANON FRACTURE;  Surgeon: Harvie Junior, MD;  Location: [facility] OR;  Service: Orthopedics;  Laterality: Right;  . Doppler echocardiography  12/2010     mild coccentric LVH but normal diastolic function of age. mild to moderate mtral regurg. moderately dilated right atrium and left atrium.   . Doppler echocardiography    . Event monitor  12/16/2010-01/10/2011    afib c-occ rvr    Allergies  Allergen Reactions  . Sulfa Antibiotics Hives  . Ultram [Tramadol]     Hallucinations.    Current Outpatient Prescriptions  Medication Sig Dispense Refill  . acetaminophen (TYLENOL) 500 MG tablet Take 1,000 mg by mouth every 6 (six) hours as needed. Pain      . ALPRAZolam (XANAX) 0.25 MG tablet Take 0.25 mg by mouth 3 (three) times daily as needed. For anxiety.      Marland Kitchen  BIOTIN PO Take 1 capsule by mouth daily.      . calcium-vitamin D (OSCAL WITH D) 500-200 MG-UNIT per tablet Take 1 tablet by mouth daily.      . furosemide (LASIX) 20 MG tablet Take 1-2 tablets a day      . levothyroxine (SYNTHROID, LEVOTHROID) 100 MCG tablet Take 100 mcg by mouth daily.        . metoprolol tartrate (LOPRESSOR) 25 MG tablet TAKE 1 TABLET TWICE A DAY  60 tablet  9  . Multiple Vitamin (MULTIVITAMIN WITH MINERALS) TABS Take 1 tablet by mouth daily.      . Multiple Vitamins-Minerals (ECHINACEA ACZ PO) Take by mouth daily.        . potassium chloride SA (K-DUR,KLOR-CON) 20 MEQ tablet Take 10 mEq by mouth daily.       . sertraline (ZOLOFT) 100 MG tablet  Take 100 mg by mouth daily.        . simvastatin (ZOCOR) 40 MG tablet Take 40 mg by mouth every evening.      . vitamin C (ASCORBIC ACID) 500 MG tablet Take 500 mg by mouth daily.      . vitamin E 1000 UNIT capsule Take 1,000 Units by mouth daily.      Marland Kitchen warfarin (COUMADIN) 5 MG tablet Take 5 mg by mouth daily. 2.5 mg on Monday Wednesday Friday 5 mg all other days      . diltiazem (CARDIZEM SR) 120 MG 12 hr capsule Take 1 capsule (120 mg total) by mouth 2 (two) times daily.  60 capsule  11   No current facility-administered medications for this visit.    History   Social History Narrative  . No narrative on file   ROS: A comprehensive Review of Systems - Negative except Symptoms noted below Musculoskeletal ROS: Mild hip and pelvic discomfort from recent fall and fracture.  PHYSICAL EXAM BP 90/60  Pulse 67  Ht 5\' 5"  (1.651 m)  Wt 147 lb 11.2 oz (66.996 kg)  BMI 24.58 kg/m2 General appearance: Very pleasant, healthy-appearing elderly woman. Will nurse and well-groomed.A&O x3; answers questions appropriately. Normal mood and affect. Neck: no adenopathy, no carotid bruit, no JVD, supple, symmetrical, trachea midline and thyroid not enlarged, symmetric, no tenderness/mass/nodules Lungs: clear to auscultation bilaterally, normal percussion bilaterally and Nonlabored, good movement Heart: irregularly irregular rhythm, S1, S2 normal and Soft 1/6 HSM at apex. Nondisplaced PMI. No other M./R./G. Abdomen: soft, non-tender; bowel sounds normal; no masses,  no organomegaly Extremities: edema Roughly 1-2+ bilaterally. and No evidence of venous stasis ulcers. Pulses: 2+ and symmetric Neurologic: Grossly normal  [id number] today: Yes Rate: 67 , Rhythm: A. fib with controlled ventricular rate. Otherwise no significant changes. Recent Labs: Labs from last week reviewed on Epic  ASSESSMENT / PLAN: A-fib She seems pretty asymptomatic with well-controlled A. fib rate. She is totally oblivious to  her being in rate controlled A. Fib.  I am a bit concerned however with her hypotension the plan had been for her to be on 240 mg Tiazac, but somehow that did not take today with a new prescription.  Plan: DC current dose of diltiazem. Switch to a shorter acting Cardizem SR 120 mg twice a day. This will allow for overall lower dose to avoid hypotension. She continues an additional dose if need be for rapid heart rates. Anticoagulated with warfarin.  Long term (current) use of anticoagulants I think based on the fact she does have fall issues with her balance problems. We  may want to shoot for a slightly lower goal of INR ranging from 1.8-2.5. This will avoid supratherapeutic INRs. We did have quite a long talk about the pros and cons of staying on any coagulation versus not. I simply think the risks of stroke a high enough to await the benefits of being off warfarin.  Hypotension due to drugs While she doesn't seen to have true orthostatic symptoms, I am worried that her balance may certainly not be helped by her hypotension. Plan: Reduce diltiazem dose as noted above. If she feels exceedingly dizzy, she can hold the evening dose of diltiazem.  Bilateral lower extremity edema I don't think this is from any sense of heart failure. This is a chronic edema. Hopefully reduce the calcium channel blocker will improve so that. She is currently taking furosemide 20 mg. It is recommended that every couple days out of the week that she take an additional dose if she is not going to be at about that day.  Poor balance Her daughter notes that 1 leg is slightly longer than the otherand this makes her more susceptible to falls that exacerbated by hypotension .  Hopefully will be improved by backing off of the calcium blocker dose.    Orders Placed This Encounter  Procedures  . EKG 12-Lead   Meds ordered this encounter  Medications  . furosemide (LASIX) 20 MG tablet    Sig: Take 1-2 tablets a day    . diltiazem (CARDIZEM SR) 120 MG 12 hr capsule    Sig: Take 1 capsule (120 mg total) by mouth 2 (two) times daily.    Dispense:  60 capsule    Refill:  11    Followup: 6 months  DAVID W. Herbie Baltimore, M.D., M.S. [facility] [address] [place], [place]  [address]  [phone] Pager # [phone]

## 2013-04-24 ENCOUNTER — Encounter: Payer: Self-pay | Admitting: Cardiology

## 2013-04-24 DIAGNOSIS — R2689 Other abnormalities of gait and mobility: Secondary | ICD-10-CM | POA: Insufficient documentation

## 2013-04-24 DIAGNOSIS — I482 Chronic atrial fibrillation, unspecified: Secondary | ICD-10-CM | POA: Insufficient documentation

## 2013-04-24 DIAGNOSIS — R600 Localized edema: Secondary | ICD-10-CM | POA: Insufficient documentation

## 2013-04-24 DIAGNOSIS — I952 Hypotension due to drugs: Secondary | ICD-10-CM | POA: Insufficient documentation

## 2013-04-24 NOTE — Assessment & Plan Note (Signed)
I don't think this is from any sense of heart failure. This is a chronic edema. Hopefully reduce the calcium channel blocker will improve so that. She is currently taking furosemide 20 mg. It is recommended that every couple days out of the week that she take an additional dose if she is not going to be at about that day.

## 2013-04-24 NOTE — Assessment & Plan Note (Signed)
I think based on the fact she does have fall issues with her balance problems. We may want to shoot for a slightly lower goal of INR ranging from 1.8-2.5. This will avoid supratherapeutic INRs. We did have quite a long talk about the pros and cons of staying on any coagulation versus not. I simply think the risks of stroke a high enough to await the benefits of being off warfarin.

## 2013-04-24 NOTE — Assessment & Plan Note (Signed)
Her daughter notes that 1 leg is slightly longer than the otherand this makes her more susceptible to falls that exacerbated by hypotension .  Hopefully will be improved by backing off of the calcium blocker dose.

## 2013-04-24 NOTE — Assessment & Plan Note (Signed)
While she doesn't seen to have true orthostatic symptoms, I am worried that her balance may certainly not be helped by her hypotension. Plan: Reduce diltiazem dose as noted above. If she feels exceedingly dizzy, she can hold the evening dose of diltiazem.

## 2013-04-24 NOTE — Assessment & Plan Note (Signed)
She seems pretty asymptomatic with well-controlled A. fib rate. She is totally oblivious to her being in rate controlled A. Fib.  I am a bit concerned however with her hypotension the plan had been for her to be on 240 mg Tiazac, but somehow that did not take today with a new prescription.  Plan: DC current dose of diltiazem. Switch to a shorter acting Cardizem SR 120 mg twice a day. This will allow for overall lower dose to avoid hypotension. She continues an additional dose if need be for rapid heart rates. Anticoagulated with warfarin.

## 2013-05-05 ENCOUNTER — Ambulatory Visit (INDEPENDENT_AMBULATORY_CARE_PROVIDER_SITE_OTHER): Payer: Medicare Other | Admitting: Pharmacist Clinician (PhC)/ Clinical Pharmacy Specialist

## 2013-05-05 VITALS — BP 100/64 | HR 84

## 2013-05-05 DIAGNOSIS — I4891 Unspecified atrial fibrillation: Secondary | ICD-10-CM

## 2013-05-05 DIAGNOSIS — Z7901 Long term (current) use of anticoagulants: Secondary | ICD-10-CM

## 2013-05-26 ENCOUNTER — Ambulatory Visit (INDEPENDENT_AMBULATORY_CARE_PROVIDER_SITE_OTHER): Payer: Medicare Other | Admitting: Pharmacist Clinician (PhC)/ Clinical Pharmacy Specialist

## 2013-05-26 VITALS — BP 134/84 | HR 76

## 2013-05-26 DIAGNOSIS — I4891 Unspecified atrial fibrillation: Secondary | ICD-10-CM

## 2013-05-26 DIAGNOSIS — Z7901 Long term (current) use of anticoagulants: Secondary | ICD-10-CM

## 2013-05-26 LAB — POCT INR: INR: 1.5

## 2013-05-27 ENCOUNTER — Emergency Department (HOSPITAL_COMMUNITY)
Admission: EM | Admit: 2013-05-27 | Discharge: 2013-05-27 | Disposition: A | Discharge status: Home / Self Care | Payer: Medicare Other | Attending: Emergency Medicine | Admitting: Emergency Medicine

## 2013-05-27 ENCOUNTER — Encounter (HOSPITAL_COMMUNITY): Payer: Self-pay | Admitting: Emergency Medicine

## 2013-05-27 ENCOUNTER — Emergency Department (HOSPITAL_COMMUNITY): Payer: Medicare Other

## 2013-05-27 DIAGNOSIS — I4891 Unspecified atrial fibrillation: Secondary | ICD-10-CM | POA: Insufficient documentation

## 2013-05-27 DIAGNOSIS — I10 Essential (primary) hypertension: Secondary | ICD-10-CM | POA: Insufficient documentation

## 2013-05-27 DIAGNOSIS — S42213A Unspecified displaced fracture of surgical neck of unspecified humerus, initial encounter for closed fracture: Secondary | ICD-10-CM | POA: Insufficient documentation

## 2013-05-27 DIAGNOSIS — E039 Hypothyroidism, unspecified: Secondary | ICD-10-CM | POA: Insufficient documentation

## 2013-05-27 DIAGNOSIS — Z8781 Personal history of (healed) traumatic fracture: Secondary | ICD-10-CM | POA: Insufficient documentation

## 2013-05-27 DIAGNOSIS — W010XXA Fall on same level from slipping, tripping and stumbling without subsequent striking against object, initial encounter: Secondary | ICD-10-CM | POA: Insufficient documentation

## 2013-05-27 DIAGNOSIS — Y9301 Activity, walking, marching and hiking: Secondary | ICD-10-CM | POA: Insufficient documentation

## 2013-05-27 DIAGNOSIS — Z862 Personal history of diseases of the blood and blood-forming organs and certain disorders involving the immune mechanism: Secondary | ICD-10-CM | POA: Insufficient documentation

## 2013-05-27 DIAGNOSIS — Y92009 Unspecified place in unspecified non-institutional (private) residence as the place of occurrence of the external cause: Secondary | ICD-10-CM | POA: Insufficient documentation

## 2013-05-27 DIAGNOSIS — S42302A Unspecified fracture of shaft of humerus, left arm, initial encounter for closed fracture: Secondary | ICD-10-CM

## 2013-05-27 DIAGNOSIS — Z79899 Other long term (current) drug therapy: Secondary | ICD-10-CM | POA: Insufficient documentation

## 2013-05-27 DIAGNOSIS — Z7901 Long term (current) use of anticoagulants: Secondary | ICD-10-CM | POA: Insufficient documentation

## 2013-05-27 MED ORDER — HYDROCODONE-ACETAMINOPHEN 5-325 MG PO TABS
1.0000 | ORAL_TABLET | Freq: Once | ORAL | Status: AC
  Administered 2013-05-27: 1 via ORAL
  Filled 2013-05-27: qty 1

## 2013-05-27 MED ORDER — HYDROCODONE-ACETAMINOPHEN 5-325 MG PO TABS: 1.0000 | ORAL_TABLET | ORAL | Status: DC | PRN

## 2013-05-27 MED ORDER — ONDANSETRON 8 MG PO TBDP: 4.0000 mg | ORAL_TABLET | Freq: Once | ORAL | Status: DC

## 2013-05-27 NOTE — ED Notes (Signed)
Per patient's daughter, this is the third time this year that patient has fallen Bruising noted to inside of LUE and left hand/fingers

## 2013-05-27 NOTE — ED Notes (Signed)
Patient with c/o left side shoulder pain after falling Dr. Fayrene Fearing at bedside

## 2013-05-27 NOTE — ED Provider Notes (Signed)
CSN: [id number]     Arrival date & time 05/27/13  2108 History   First MD Initiated Contact with Patient 05/27/13 2113     Chief Complaint  Patient presents with  . Fall    HPI  Patient fell at home. She was walking into a dark room. She tripped over a chair. She landed on outstretched arms. She has pain and bruising just below her left shoulder her left humerus. She had a right total shoulder and repair right elbow injury surgically in January this year she just finished physical therapy last week. No injury to the right side. Did not take her head. Did not lose consciousness. . Not syncope.  Past Medical History  Diagnosis Date  . Chronic ITP (idiopathic thrombocytopenia)   . Chronic edema     BLE  . Thyroid disease     Hypothyroid  . Hypertension     MD took pt off of meds 04/2012  . Hypothyroidism   . Pelvic fracture October 2014    Recent fall  . Chronic atrial fibrillation     History of RVR June 2012   Past Surgical History  Procedure Laterality Date  . Shoulder hemi-arthroplasty  06/23/2012    Procedure: SHOULDER HEMI-ARTHROPLASTY;  Surgeon: Harvie Junior, MD;  Location: [facility] OR;  Service: Orthopedics;  Laterality: Right;  . Abdominal hysterectomy      1998  . Cesarean section      1969  . Bunionectomy      2000  . Orif elbow fracture  07/16/2012    Procedure: OPEN REDUCTION INTERNAL FIXATION (ORIF) ELBOW/OLECRANON FRACTURE;  Surgeon: Harvie Junior, MD;  Location: [facility] OR;  Service: Orthopedics;  Laterality: Right;  . Doppler echocardiography  12/2010     mild coccentric LVH but normal diastolic function of age. mild to moderate mtral regurg. moderately dilated right atrium and left atrium.   . Doppler echocardiography    . Event monitor  12/16/2010-01/10/2011    afib c-occ rvr   Family History  Problem Relation Age of Onset  . Stomach cancer Mother   . Diabetes Mother   . Heart attack Father   . Leukemia Sister   . Stroke Brother   . Stroke Sister    History   Substance Use Topics  . Smoking status: Never Smoker   . Smokeless tobacco: Not on file  . Alcohol Use: No   OB History   Grav Para Term Preterm Abortions TAB SAB Ect Mult Living                 Review of Systems  Constitutional: Negative for fever, chills, diaphoresis, appetite change and fatigue.  HENT: Negative for mouth sores, sore throat and trouble swallowing.   Eyes: Negative for visual disturbance.  Respiratory: Negative for cough, chest tightness, shortness of breath and wheezing.   Cardiovascular: Negative for chest pain.  Gastrointestinal: Negative for nausea, vomiting, abdominal pain, diarrhea and abdominal distention.  Endocrine: Negative for polydipsia, polyphagia and polyuria.  Genitourinary: Negative for dysuria, frequency and hematuria.  Musculoskeletal: Negative for gait problem.       Pain in bruising to left shoulder  Skin: Negative for color change, pallor and rash.  Neurological: Negative for dizziness, syncope, light-headedness and headaches.  Hematological: Does not bruise/bleed easily.  Psychiatric/Behavioral: Negative for behavioral problems and confusion.    Allergies  Sulfa antibiotics and Ultram  Home Medications   Current Outpatient Rx  Name  Route  Sig  Dispense  Refill  .  acetaminophen (TYLENOL) 500 MG tablet   Oral   Take 1,000 mg by mouth every 6 (six) hours as needed. Pain         . ALPRAZolam (XANAX) 0.25 MG tablet   Oral   Take 0.25 mg by mouth 3 (three) times daily as needed. For anxiety.         Marland Kitchen BIOTIN PO   Oral   Take 1 capsule by mouth daily.         . calcium-vitamin D (OSCAL WITH D) 500-200 MG-UNIT per tablet   Oral   Take 1 tablet by mouth daily.         Marland Kitchen diltiazem (CARDIZEM SR) 120 MG 12 hr capsule   Oral   Take 1 capsule (120 mg total) by mouth 2 (two) times daily.   60 capsule   11   . furosemide (LASIX) 20 MG tablet      Take 1-2 tablets a day         . levothyroxine (SYNTHROID, LEVOTHROID) 100  MCG tablet   Oral   Take 100 mcg by mouth daily.           . metoprolol tartrate (LOPRESSOR) 25 MG tablet      TAKE 1 TABLET TWICE A DAY   60 tablet   9   . Multiple Vitamin (MULTIVITAMIN WITH MINERALS) TABS   Oral   Take 1 tablet by mouth daily.         . Multiple Vitamins-Minerals (ECHINACEA ACZ PO)   Oral   Take by mouth daily.           . potassium chloride SA (K-DUR,KLOR-CON) 20 MEQ tablet   Oral   Take 10 mEq by mouth daily.          . sertraline (ZOLOFT) 100 MG tablet   Oral   Take 100 mg by mouth daily.           . simvastatin (ZOCOR) 40 MG tablet   Oral   Take 40 mg by mouth every evening.         . thiamine (VITAMIN B-1) 100 MG tablet   Oral   Take 100 mg by mouth daily.         . vitamin C (ASCORBIC ACID) 500 MG tablet   Oral   Take 500 mg by mouth daily.         . vitamin E 1000 UNIT capsule   Oral   Take 1,000 Units by mouth daily.         Marland Kitchen warfarin (COUMADIN) 5 MG tablet   Oral   Take 2.5-5 mg by mouth daily. 5 mg on Monday Wednesday Friday 2.5 mg all other days         . HYDROcodone-acetaminophen (NORCO/VICODIN) 5-325 MG per tablet   Oral   Take 1 tablet by mouth every 4 (four) hours as needed.   20 tablet   0    BP 111/69  Pulse 78  Temp(Src) 98.4 F (36.9 C) (Oral)  Resp 18  SpO2 99% Physical Exam  Constitutional: She is oriented to person, place, and time. She appears well-developed and well-nourished. No distress.  HENT:  Head: Normocephalic.  Eyes: Conjunctivae are normal. Pupils are equal, round, and reactive to light. No scleral icterus.  Neck: Normal range of motion. Neck supple. No thyromegaly present.  Cardiovascular: Normal rate and regular rhythm.  Exam reveals no gallop and no friction rub.   No murmur heard. Pulmonary/Chest: Effort normal  and breath sounds normal. No respiratory distress. She has no wheezes. She has no rales.  Abdominal: Soft. Bowel sounds are normal. She exhibits no distension.  There is no tenderness. There is no rebound.  Musculoskeletal: Normal range of motion.  Ecchymosis to the medial left upper arm. No deformity. Good feeling and sensation distally normal elbow and wrist range of motion. Nontender over the clavicle. Clinically not dislocated.  Neurological: She is alert and oriented to person, place, and time.  Skin: Skin is warm and dry. No rash noted.  Psychiatric: She has a normal mood and affect. Her behavior is normal.    ED Course  Procedures (including critical care time) Labs Review Labs Reviewed - No data to display Imaging Review No results found.  EKG Interpretation   None       MDM   1. Humerus fracture, left, closed, initial encounter    X-rays show an impacted proximal humerus/surgical neck fracture. Not displaced. Plan will be sling immobilization. Followup with her orthopedic surgeon. Vicodin for pain.    Roney Marion, MD 05/27/13 2207

## 2013-05-27 NOTE — ED Notes (Signed)
Bed: NW29 Expected date:  Expected time:  Means of arrival:  Comments: EMS 79yo F, fall

## 2013-05-27 NOTE — ED Notes (Signed)
Ortho tech paged  

## 2013-05-28 ENCOUNTER — Other Ambulatory Visit: Payer: Self-pay | Admitting: Cardiology

## 2013-05-28 NOTE — Telephone Encounter (Signed)
Rx was sent to pharmacy electronically. 

## 2013-05-30 IMAGING — CR DG SHOULDER 2+V*R*
3 series · 3 of 3 positions shown · non-contrast
Comparison: None.

CLINICAL DATA: Fall, shoulder pain.

RIGHT SHOULDER - 2+ VIEW

[x shoulder ap right (1 of 3)]
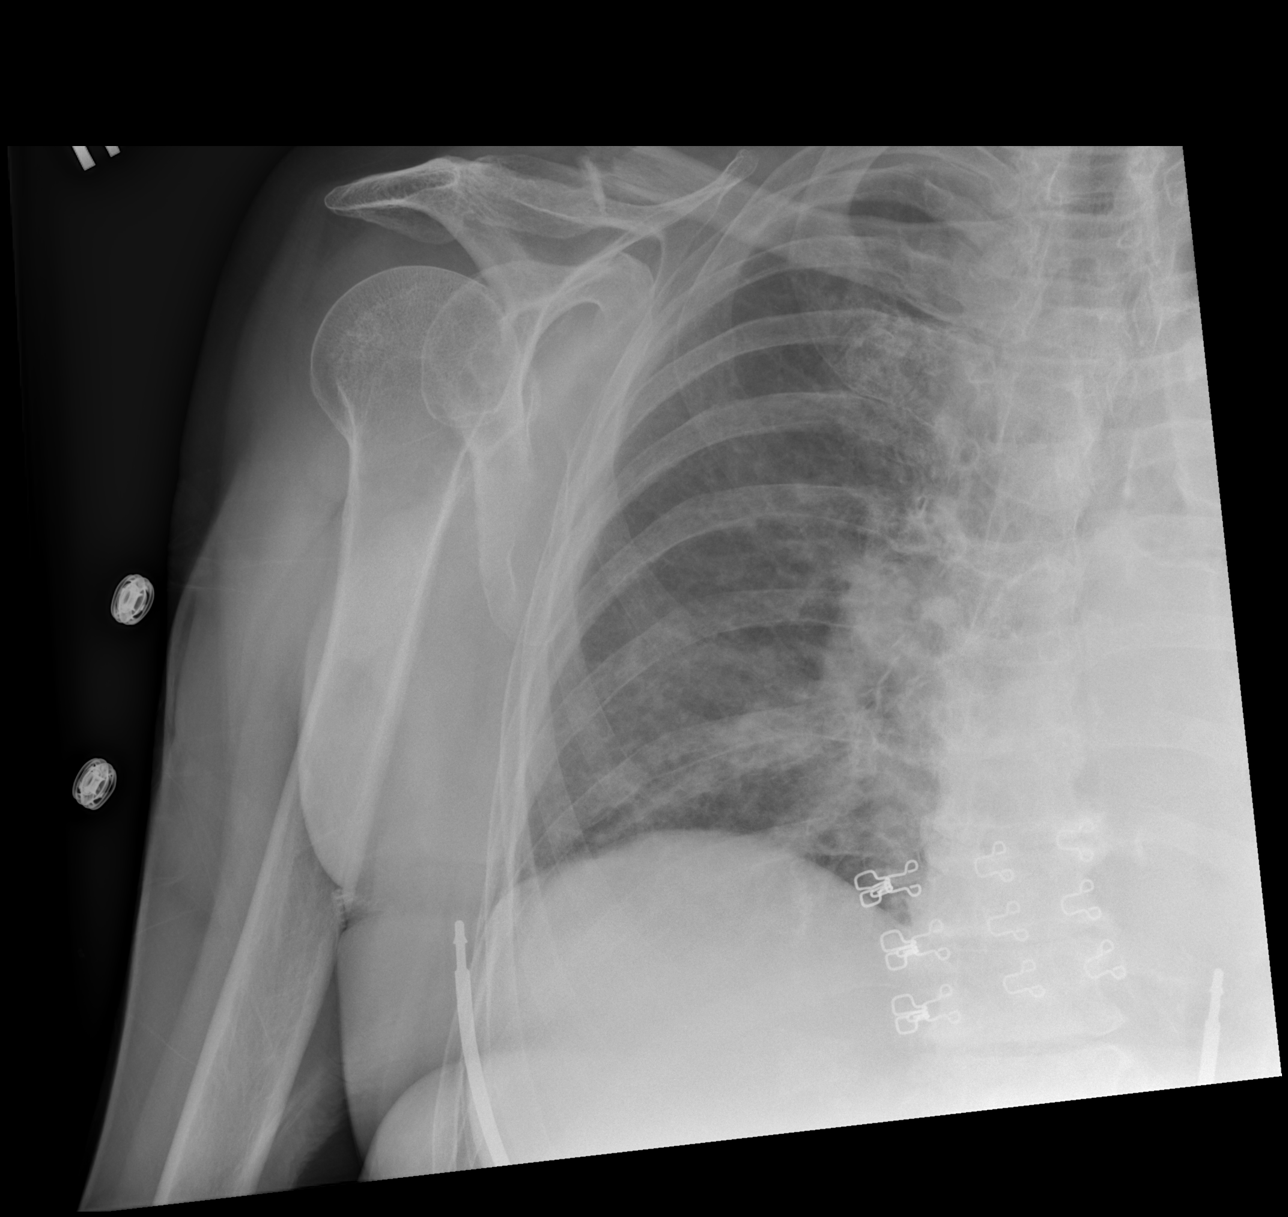

[x shoulder ap right (2 of 3)]
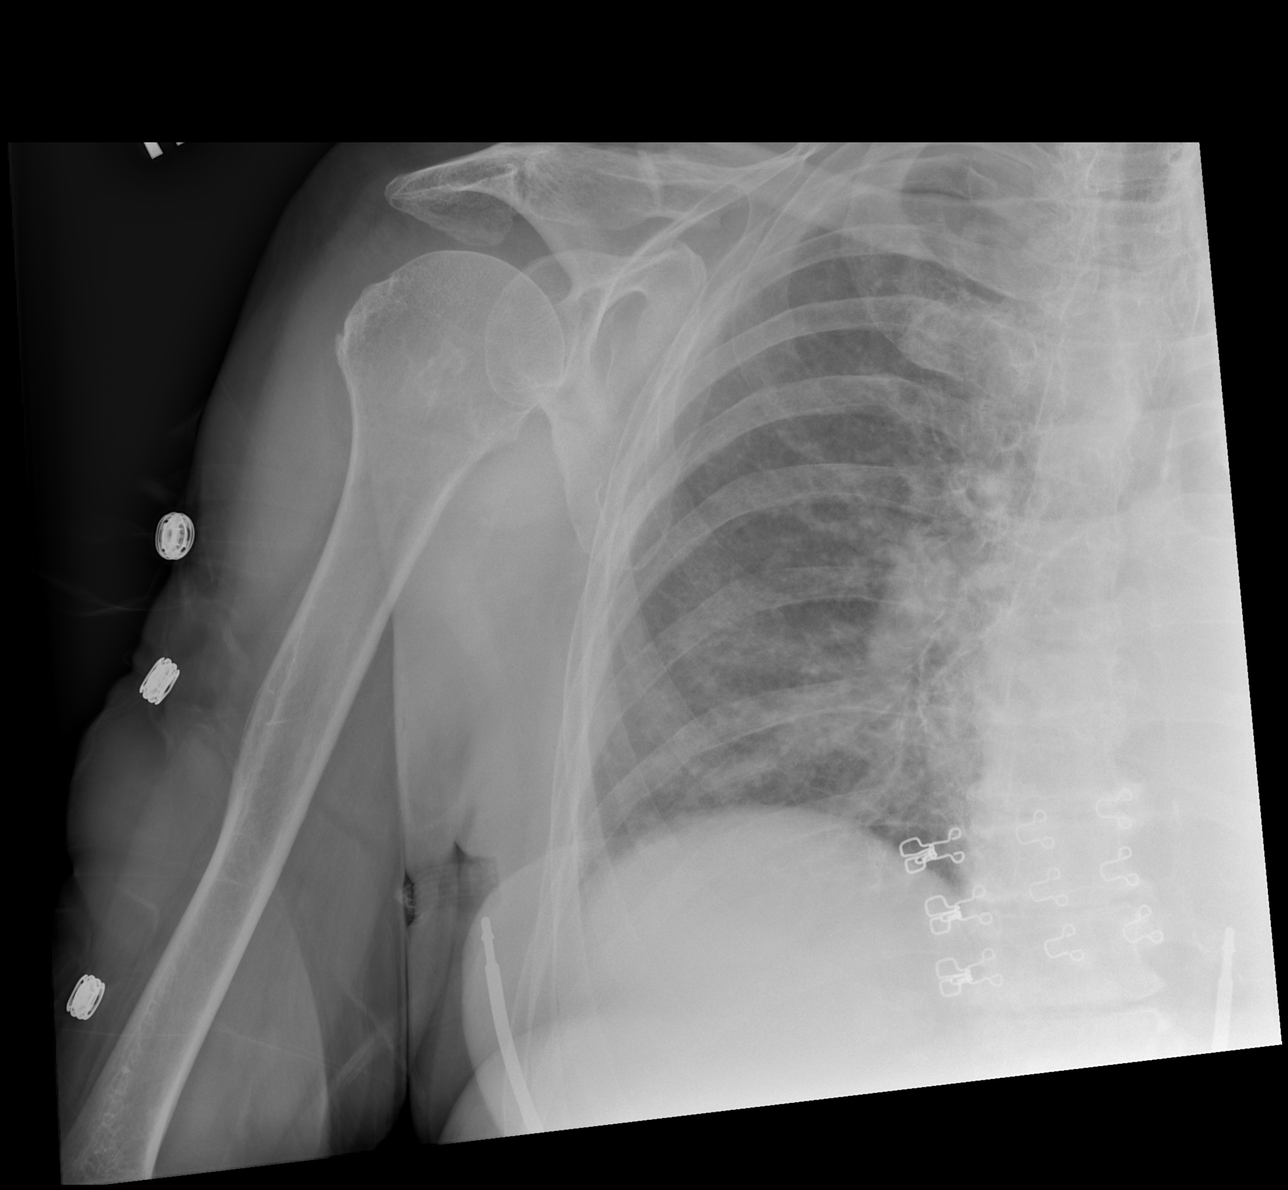

[x shoulder ap right (3 of 3)]
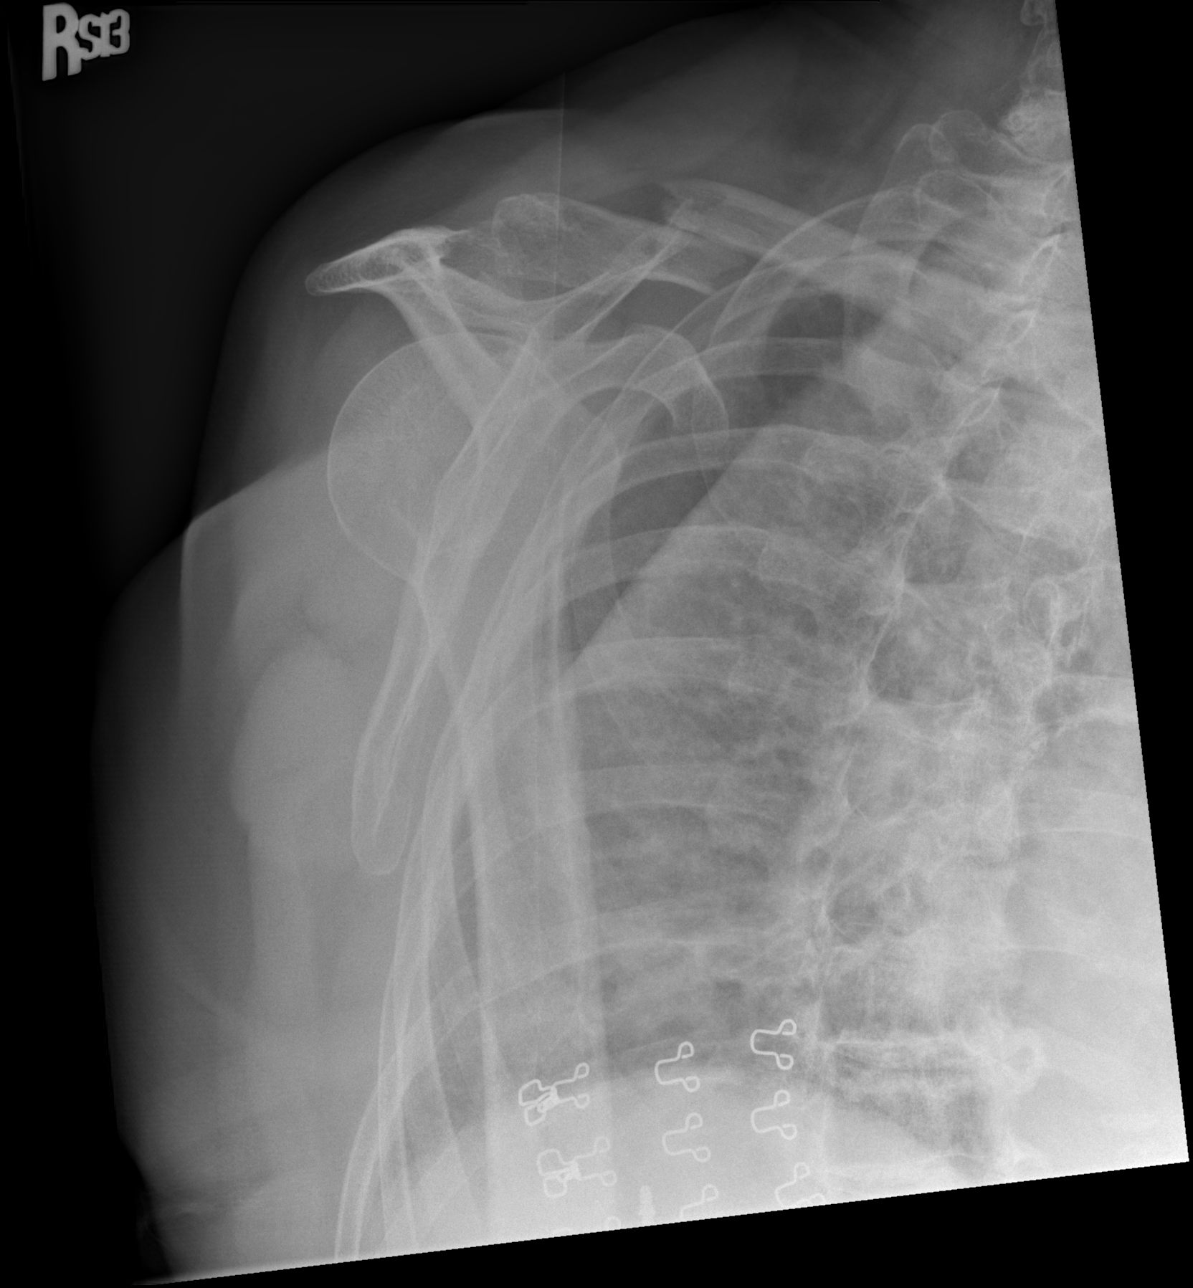

[3 of 3 positions shown; findings below may reference images not displayed]

FINDINGS: There is a fracture through the distal right clavicle.
Distal fragment displaced inferiorly greater than one shaft width.
AC joint and glenohumeral joint appear intact.
IMPRESSION: Displaced distal right clavicular fracture.

## 2013-06-17 ENCOUNTER — Ambulatory Visit: Payer: Medicare Other | Admitting: Pharmacist Clinician (PhC)/ Clinical Pharmacy Specialist

## 2013-06-18 ENCOUNTER — Telehealth: Payer: Self-pay | Admitting: *Deleted

## 2013-06-22 ENCOUNTER — Ambulatory Visit: Payer: Medicare Other | Admitting: Pharmacist Clinician (PhC)/ Clinical Pharmacy Specialist

## 2013-06-25 ENCOUNTER — Ambulatory Visit: Payer: Medicare Other | Admitting: Pharmacist Clinician (PhC)/ Clinical Pharmacy Specialist

## 2013-06-29 ENCOUNTER — Ambulatory Visit (INDEPENDENT_AMBULATORY_CARE_PROVIDER_SITE_OTHER): Payer: Medicare Other | Admitting: Pharmacist Clinician (PhC)/ Clinical Pharmacy Specialist

## 2013-06-29 VITALS — BP 96/60 | HR 76

## 2013-06-29 DIAGNOSIS — Z5181 Encounter for therapeutic drug level monitoring: Secondary | ICD-10-CM

## 2013-06-29 DIAGNOSIS — Z7901 Long term (current) use of anticoagulants: Secondary | ICD-10-CM

## 2013-06-29 DIAGNOSIS — I4891 Unspecified atrial fibrillation: Secondary | ICD-10-CM

## 2013-06-29 LAB — POCT INR: INR: 1.5

## 2013-07-13 ENCOUNTER — Ambulatory Visit: Payer: Self-pay | Admitting: Pharmacist Clinician (PhC)/ Clinical Pharmacy Specialist

## 2013-07-20 ENCOUNTER — Ambulatory Visit (INDEPENDENT_AMBULATORY_CARE_PROVIDER_SITE_OTHER): Payer: Medicare Other | Admitting: Pharmacist Clinician (PhC)/ Clinical Pharmacy Specialist

## 2013-07-20 VITALS — BP 110/80 | HR 76

## 2013-07-20 DIAGNOSIS — I4891 Unspecified atrial fibrillation: Secondary | ICD-10-CM

## 2013-07-20 DIAGNOSIS — Z7901 Long term (current) use of anticoagulants: Secondary | ICD-10-CM

## 2013-07-20 LAB — POCT INR: INR: 2.5

## 2013-08-17 ENCOUNTER — Ambulatory Visit (INDEPENDENT_AMBULATORY_CARE_PROVIDER_SITE_OTHER): Payer: Medicare Other | Admitting: Pharmacist Clinician (PhC)/ Clinical Pharmacy Specialist

## 2013-08-17 DIAGNOSIS — I4891 Unspecified atrial fibrillation: Secondary | ICD-10-CM

## 2013-08-17 DIAGNOSIS — Z7901 Long term (current) use of anticoagulants: Secondary | ICD-10-CM

## 2013-08-17 LAB — POCT INR: INR: 2.3

## 2013-09-09 ENCOUNTER — Other Ambulatory Visit: Payer: Self-pay | Admitting: Pharmacist Clinician (PhC)/ Clinical Pharmacy Specialist

## 2013-09-09 ENCOUNTER — Telehealth: Payer: Self-pay | Admitting: Cardiology

## 2013-09-09 MED ORDER — WARFARIN SODIUM 5 MG PO TABS: ORAL_TABLET | ORAL | Status: DC

## 2013-09-09 NOTE — Telephone Encounter (Signed)
No refill request received per Epic.  Message forwarded to Phillips HayKristin Alvstad, PharmD.

## 2013-09-09 NOTE — Telephone Encounter (Signed)
Still have not heard from you on her refill on Warfarin 5 mg #30.

## 2013-09-14 ENCOUNTER — Ambulatory Visit (INDEPENDENT_AMBULATORY_CARE_PROVIDER_SITE_OTHER): Payer: Medicare Other | Admitting: Pharmacist Clinician (PhC)/ Clinical Pharmacy Specialist

## 2013-09-14 DIAGNOSIS — I4891 Unspecified atrial fibrillation: Secondary | ICD-10-CM

## 2013-09-14 DIAGNOSIS — Z7901 Long term (current) use of anticoagulants: Secondary | ICD-10-CM

## 2013-09-14 LAB — POCT INR: INR: 1.6

## 2013-09-23 ENCOUNTER — Other Ambulatory Visit: Payer: Self-pay | Admitting: *Deleted

## 2013-09-23 MED ORDER — SIMVASTATIN 40 MG PO TABS
40.0000 mg | ORAL_TABLET | Freq: Every evening | ORAL | Status: AC
Start: 2013-09-23 — End: ?

## 2013-09-23 NOTE — Telephone Encounter (Signed)
Rx refill sent to patient pharmacy   

## 2013-10-02 ENCOUNTER — Ambulatory Visit: Payer: Self-pay | Admitting: Pharmacist Clinician (PhC)/ Clinical Pharmacy Specialist

## 2013-10-09 ENCOUNTER — Ambulatory Visit: Payer: Self-pay | Admitting: Pharmacist Clinician (PhC)/ Clinical Pharmacy Specialist

## 2013-10-09 NOTE — Telephone Encounter (Signed)
Encounter Closed 10/09/13 TP 

## 2013-10-16 ENCOUNTER — Ambulatory Visit (INDEPENDENT_AMBULATORY_CARE_PROVIDER_SITE_OTHER): Payer: Medicare Other | Admitting: Pharmacist Clinician (PhC)/ Clinical Pharmacy Specialist

## 2013-10-16 DIAGNOSIS — I4891 Unspecified atrial fibrillation: Secondary | ICD-10-CM

## 2013-10-16 DIAGNOSIS — Z7901 Long term (current) use of anticoagulants: Secondary | ICD-10-CM

## 2013-10-16 LAB — POCT INR: INR: 2.4

## 2013-10-24 ENCOUNTER — Emergency Department (HOSPITAL_COMMUNITY): Payer: Medicare Other

## 2013-10-24 ENCOUNTER — Emergency Department (HOSPITAL_COMMUNITY)
Admission: EM | Admit: 2013-10-24 | Discharge: 2013-10-24 | Disposition: A | Discharge status: Home / Self Care | Payer: Medicare Other | Attending: Emergency Medicine | Admitting: Emergency Medicine

## 2013-10-24 ENCOUNTER — Encounter (HOSPITAL_COMMUNITY): Payer: Self-pay | Admitting: Emergency Medicine

## 2013-10-24 DIAGNOSIS — I4891 Unspecified atrial fibrillation: Secondary | ICD-10-CM | POA: Insufficient documentation

## 2013-10-24 DIAGNOSIS — S46909A Unspecified injury of unspecified muscle, fascia and tendon at shoulder and upper arm level, unspecified arm, initial encounter: Secondary | ICD-10-CM | POA: Insufficient documentation

## 2013-10-24 DIAGNOSIS — R296 Repeated falls: Secondary | ICD-10-CM | POA: Insufficient documentation

## 2013-10-24 DIAGNOSIS — Y9389 Activity, other specified: Secondary | ICD-10-CM | POA: Insufficient documentation

## 2013-10-24 DIAGNOSIS — S3981XA Other specified injuries of abdomen, initial encounter: Secondary | ICD-10-CM | POA: Insufficient documentation

## 2013-10-24 DIAGNOSIS — Y929 Unspecified place or not applicable: Secondary | ICD-10-CM | POA: Insufficient documentation

## 2013-10-24 DIAGNOSIS — S4980XA Other specified injuries of shoulder and upper arm, unspecified arm, initial encounter: Secondary | ICD-10-CM | POA: Insufficient documentation

## 2013-10-24 DIAGNOSIS — I10 Essential (primary) hypertension: Secondary | ICD-10-CM | POA: Insufficient documentation

## 2013-10-24 DIAGNOSIS — Z862 Personal history of diseases of the blood and blood-forming organs and certain disorders involving the immune mechanism: Secondary | ICD-10-CM | POA: Insufficient documentation

## 2013-10-24 DIAGNOSIS — Z7901 Long term (current) use of anticoagulants: Secondary | ICD-10-CM | POA: Insufficient documentation

## 2013-10-24 DIAGNOSIS — E039 Hypothyroidism, unspecified: Secondary | ICD-10-CM | POA: Insufficient documentation

## 2013-10-24 DIAGNOSIS — Z79899 Other long term (current) drug therapy: Secondary | ICD-10-CM | POA: Insufficient documentation

## 2013-10-24 DIAGNOSIS — S20229A Contusion of unspecified back wall of thorax, initial encounter: Secondary | ICD-10-CM | POA: Insufficient documentation

## 2013-10-24 DIAGNOSIS — W19XXXA Unspecified fall, initial encounter: Secondary | ICD-10-CM

## 2013-10-24 DIAGNOSIS — Z8781 Personal history of (healed) traumatic fracture: Secondary | ICD-10-CM | POA: Insufficient documentation

## 2013-10-24 MED ORDER — HYDROCODONE-ACETAMINOPHEN 5-325 MG PO TABS: 1.0000 | ORAL_TABLET | ORAL | Status: DC | PRN

## 2013-10-24 NOTE — Discharge Instructions (Signed)
Contusion  A contusion is a deep bruise. Contusions happen when an injury causes bleeding under the skin. Signs of bruising include pain, puffiness (swelling), and discolored skin. The contusion may turn blue, purple, or yellow.  HOME CARE   · Put ice on the injured area.  · Put ice in a plastic bag.  · Place a towel between your skin and the bag.  · Leave the ice on for 15-20 minutes, 03-04 times a day.  · Only take medicine as told by your doctor.  · Rest the injured area.  · If possible, raise (elevate) the injured area to lessen puffiness.  GET HELP RIGHT AWAY IF:   · You have more bruising or puffiness.  · You have pain that is getting worse.  · Your puffiness or pain is not helped by medicine.  MAKE SURE YOU:   · Understand these instructions.  · Will watch your condition.  · Will get help right away if you are not doing well or get worse.  Document Released: 11/14/2007 Document Revised: 08/20/2011 Document Reviewed: 04/02/2011  [employer]® Patient Information ©2014 [employer].

## 2013-10-24 NOTE — ED Notes (Signed)
Pt reports feeling a sharp shooting pain under her breasts, points to epigastric area, last night that happened 2 or 3 times. She went to bed, the pain has not recurred again today. Pt denies Hx GERD, but had greasy, fried dinner last night. No pain to abd on palpation. No bruising noted.

## 2013-10-24 NOTE — ED Notes (Signed)
Patient transported to X-ray 

## 2013-10-24 NOTE — ED Provider Notes (Signed)
CSN: [id number]     Arrival date & time 10/24/13  1454 History   First MD Initiated Contact with Patient 10/24/13 1518     Chief Complaint  Patient presents with  . Fall  . Back Pain  . Shoulder Pain  . Abdominal Pain     (Consider location/radiation/quality/duration/timing/severity/associated sxs/prior Treatment) Patient is a 80 y.o. female presenting with fall, back pain, shoulder pain, and abdominal pain. The history is provided by the patient and a relative.  Fall Associated symptoms include abdominal pain.  Back Pain Associated symptoms: abdominal pain   Shoulder Pain Associated symptoms include abdominal pain.  Abdominal Pain  She fell 3 days ago, after getting up from the commode. Her daughter was with her, and feels that she slipped, then slid down the wall. Initially, she did not have any pain. The following day she developed pain in her right flank, right, pelvis, and it was walking, with a limp on her right leg. There's been no large bruising noted. She's continued to eat well. There was no precipitating cause, other than slipping. There've been no recent illnesses. She denies fever, chills, nausea, vomiting, weakness, or dizziness. There's been no change in bowel or urinary habits. She is using her usual medication, without relief. There are no other known modifying factors.   Past Medical History  Diagnosis Date  . Chronic ITP (idiopathic thrombocytopenia)   . Chronic edema     BLE  . Thyroid disease     Hypothyroid  . Hypertension     MD took pt off of meds 04/2012  . Hypothyroidism   . Pelvic fracture October 2014    Recent fall  . Chronic atrial fibrillation     History of RVR June 2012   Past Surgical History  Procedure Laterality Date  . Shoulder hemi-arthroplasty  06/23/2012    Procedure: SHOULDER HEMI-ARTHROPLASTY;  Surgeon: Harvie JuniorJohn L Graves, MD;  Location: [facility] OR;  Service: Orthopedics;  Laterality: Right;  . Abdominal hysterectomy      1998  . Cesarean  section      1969  . Bunionectomy      2000  . Orif elbow fracture  07/16/2012    Procedure: OPEN REDUCTION INTERNAL FIXATION (ORIF) ELBOW/OLECRANON FRACTURE;  Surgeon: Harvie JuniorJohn L Graves, MD;  Location: [facility] OR;  Service: Orthopedics;  Laterality: Right;  . Doppler echocardiography  12/2010     mild coccentric LVH but normal diastolic function of age. mild to moderate mtral regurg. moderately dilated right atrium and left atrium.   . Doppler echocardiography    . Event monitor  12/16/2010-01/10/2011    afib c-occ rvr   Family History  Problem Relation Age of Onset  . Stomach cancer Mother   . Diabetes Mother   . Heart attack Father   . Leukemia Sister   . Stroke Brother   . Stroke Sister    History  Substance Use Topics  . Smoking status: Never Smoker   . Smokeless tobacco: Not on file  . Alcohol Use: No   OB History   Grav Para Term Preterm Abortions TAB SAB Ect Mult Living                 Review of Systems  Gastrointestinal: Positive for abdominal pain.  Musculoskeletal: Positive for back pain.  All other systems reviewed and are negative.     Allergies  Sulfa antibiotics and Ultram  Home Medications   Prior to Admission medications   Medication Sig Start Date End Date  Taking? Authorizing Provider  ALPRAZolam (XANAX) 0.25 MG tablet Take 0.25 mg by mouth 3 (three) times daily as needed. For anxiety.   Yes Historical Provider, MD  Biotin 5000 MCG TABS Take 5,000 mg by mouth daily.   Yes Historical Provider, MD  calcium-vitamin D (OSCAL WITH D) 500-200 MG-UNIT per tablet Take 1 tablet by mouth daily.   Yes Historical Provider, MD  diltiazem (CARDIZEM SR) 120 MG 12 hr capsule Take 1 capsule (120 mg total) by mouth 2 (two) times daily. 04/22/13  Yes Marykay Lexavid W Harding, MD  Echinacea 400 MG CAPS Take 1 capsule by mouth daily.   Yes Historical Provider, MD  furosemide (LASIX) 20 MG tablet Take 20 mg by mouth daily. Always takes 1 tablet daily but if swelling is bad patient will take  2.   Yes Historical Provider, MD  levothyroxine (SYNTHROID, LEVOTHROID) 100 MCG tablet Take 100 mcg by mouth daily.    Yes Historical Provider, MD  metoprolol tartrate (LOPRESSOR) 25 MG tablet Take 25 mg by mouth 2 (two) times daily.   Yes Historical Provider, MD  Multiple Vitamin (MULTIVITAMIN WITH MINERALS) TABS Take 1 tablet by mouth daily.   Yes Historical Provider, MD  potassium chloride SA (K-DUR,KLOR-CON) 20 MEQ tablet Take 10-20 mEq by mouth daily. If patient eats a banana she only takes 0.5 tablet, if not patient takes 1 tablet   Yes Historical Provider, MD  sertraline (ZOLOFT) 100 MG tablet Take 100 mg by mouth daily.    Yes Historical Provider, MD  simvastatin (ZOCOR) 40 MG tablet Take 1 tablet (40 mg total) by mouth every evening. 09/23/13  Yes Marykay Lexavid W Harding, MD  thiamine (VITAMIN B-1) 100 MG tablet Take 100 mg by mouth daily.   Yes Historical Provider, MD  vitamin C (ASCORBIC ACID) 500 MG tablet Take 500 mg by mouth daily.   Yes Historical Provider, MD  vitamin E 1000 UNIT capsule Take 1,000 Units by mouth daily.   Yes Historical Provider, MD  warfarin (COUMADIN) 5 MG tablet Take 2.5-5 mg by mouth daily at 6 PM. Takes 2.5 on Mon, Wed, Fri and Sat. Takes 5 mg on all other days 09/09/13  Yes Kristin Alvstad, RPH-CPP   BP 129/96  Pulse 74  Temp(Src) 98 F (36.7 C)  Resp 18  SpO2 90% Physical Exam  Nursing note and vitals reviewed. Constitutional: She is oriented to person, place, and time. She appears well-developed and well-nourished.  Vigorous  HENT:  Head: Normocephalic and atraumatic.  Eyes: Conjunctivae and EOM are normal. Pupils are equal, round, and reactive to light.  Neck: Normal range of motion and phonation normal. Neck supple.  Cardiovascular: Normal rate, regular rhythm and intact distal pulses.   Pulmonary/Chest: Effort normal and breath sounds normal. No respiratory distress. She exhibits no tenderness.  Abdominal: Soft. She exhibits no distension. There is no  tenderness. There is no guarding.  Musculoskeletal: Normal range of motion.  No palpable tenderness of the cervical, thoracic or lumbar spine regions. No deformity of the spine. No significant para vertebral muscular tenderness. Normal range of motion. Shoulders, elbows, wrists, hips, knees, and ankles.  Neurological: She is alert and oriented to person, place, and time. She exhibits normal muscle tone.  Skin: Skin is warm and dry.  Psychiatric: She has a normal mood and affect. Her behavior is normal. Judgment and thought content normal.    ED Course  Procedures (including critical care time) Medications - No data to display  Patient Vitals for the past  24 hrs:  BP Temp Pulse Resp SpO2  10/24/13 1759 129/96 mmHg - 74 18 90 %  10/24/13 1501 106/68 mmHg 98 F (36.7 C) 80 17 99 %      Labs Review Labs Reviewed - No data to display  Imaging Review Dg Cervical Spine Complete  10/24/2013   CLINICAL DATA:  Fall.  EXAM: CERVICAL SPINE  4+ VIEWS  COMPARISON:  None  FINDINGS: Normal alignment of the cervical spine. The cervical vertebral body heights are well maintained. There is no evidence for cervical spine fracture. Multi level disc space narrowing and ventral endplate spurring is noted. This is most advanced at the C4-5, C5-6 and C6-7 level. There is no fracture or subluxation identified.  IMPRESSION: 1. No acute findings. 2. Cervical degenerative disc disease.   Electronically Signed   By: Signa Kell M.D.   On: 10/24/2013 16:33   Dg Thoracic Spine 2 View  10/24/2013   CLINICAL DATA:  Fall.  EXAM: THORACIC SPINE - 2 VIEW  COMPARISON:  07/16/13  FINDINGS: There is a curvature of the thoracic spine which is convex towards the right. The thoracic vertebral bodies are well-maintained without evidence for fracture. Multi level disc space narrowing and ventral endplate spurring is noted compatible with degenerative disc disease. Compression deformity within the lumbar spine is unchanged from  07/16/2012  IMPRESSION: 1. Scoliosis and multilevel degenerative disc disease. 2. Stable L1 compression fracture.   Electronically Signed   By: Signa Kell M.D.   On: 10/24/2013 16:32   Dg Lumbar Spine Complete  10/24/2013   CLINICAL DATA:  Fall.  EXAM: LUMBAR SPINE - COMPLETE 4+ VIEW  COMPARISON:  None  FINDINGS: There is a scoliosis deformity involving the lumbar spine which is convex towards the left. There is an age indeterminate compression fracture involving the L1 vertebral body. There is loss of at least 60% of the vertebral body height. Cannot rule out retropulsion of fracture fragments. Marked multi level degenerative disc disease is also noted.  IMPRESSION: 1. Age-indeterminate L1 compression fracture. 2. Scoliosis.   Electronically Signed   By: Signa Kell M.D.   On: 10/24/2013 16:29   Dg Pelvis 1-2 Views  10/24/2013   CLINICAL DATA:  Fall.  EXAM: PELVIS - 1-2 VIEW  COMPARISON:  03/25/2013  FINDINGS: The bones appear osteopenic. Chronic appearing right superior and inferior pubic rami fractures are identified. There also several fractures involving the left inferior pubic rami which appear age indeterminate. Degenerative disc disease is noted within the lumbar spine.  IMPRESSION: 1. Osteopenia 2. Chronic right superior and inferior pubic rami fractures. 3. Age indeterminate left inferior pubic rami fractures.   Electronically Signed   By: Signa Kell M.D.   On: 10/24/2013 16:27     EKG Interpretation None      MDM   Final diagnoses:  Fall  Contusion, back    Fall without serious injury, fall, appears to be mechanical.   Nursing Notes Reviewed/ Care Coordinated Applicable Imaging Reviewed Interpretation of Laboratory Data incorporated into ED treatment  The patient appears reasonably screened and/or stabilized for discharge and I doubt any other medical condition or other [facility] requiring further screening, evaluation, or treatment in the ED at this time prior to  discharge.  Plan: Home Medications- Norco; Home Treatments- rest; return here if the recommended treatment, does not improve the symptoms; Recommended follow up- PCP prn    Flint Melter, MD 10/24/13 2328

## 2013-10-24 NOTE — ED Notes (Addendum)
Family reports pt is hard of hearing. Pt has hx of fall. Pt fell on Wednesday night. Lost her balance and slid back agaist a wall down the floor. Now complaining of abdominal pain, lower right hip pain, and left shoulder/arm pain. Pain 5/10.   Pt can normally ambulate around the house, pt had difficult walking 2 steps in room at present, family reports this is a decrease in her ambulation.

## 2013-11-12 ENCOUNTER — Other Ambulatory Visit: Payer: Self-pay | Admitting: *Deleted

## 2013-11-12 MED ORDER — METOPROLOL TARTRATE 25 MG PO TABS: 25.0000 mg | ORAL_TABLET | Freq: Two times a day (BID) | ORAL | Status: AC

## 2013-11-12 NOTE — Telephone Encounter (Signed)
Rx was sent to pharmacy electronically. 

## 2013-11-13 ENCOUNTER — Ambulatory Visit (INDEPENDENT_AMBULATORY_CARE_PROVIDER_SITE_OTHER): Payer: Medicare Other | Admitting: Pharmacist Clinician (PhC)/ Clinical Pharmacy Specialist

## 2013-11-13 DIAGNOSIS — I4891 Unspecified atrial fibrillation: Secondary | ICD-10-CM

## 2013-11-13 DIAGNOSIS — Z7901 Long term (current) use of anticoagulants: Secondary | ICD-10-CM

## 2013-11-13 LAB — POCT INR: INR: 2.6

## 2013-12-07 ENCOUNTER — Ambulatory Visit: Payer: Self-pay | Admitting: Cardiology

## 2013-12-07 ENCOUNTER — Ambulatory Visit: Payer: Self-pay | Admitting: Pharmacist Clinician (PhC)/ Clinical Pharmacy Specialist

## 2013-12-16 ENCOUNTER — Ambulatory Visit (INDEPENDENT_AMBULATORY_CARE_PROVIDER_SITE_OTHER): Payer: Medicare Other | Admitting: Cardiology

## 2013-12-16 VITALS — BP 122/84 | HR 65 | Ht 65.0 in | Wt 153.8 lb

## 2013-12-16 DIAGNOSIS — R600 Localized edema: Secondary | ICD-10-CM

## 2013-12-16 DIAGNOSIS — I4891 Unspecified atrial fibrillation: Secondary | ICD-10-CM

## 2013-12-16 DIAGNOSIS — Z7901 Long term (current) use of anticoagulants: Secondary | ICD-10-CM

## 2013-12-16 DIAGNOSIS — I10 Essential (primary) hypertension: Secondary | ICD-10-CM

## 2013-12-16 DIAGNOSIS — I482 Chronic atrial fibrillation, unspecified: Secondary | ICD-10-CM

## 2013-12-16 DIAGNOSIS — R609 Edema, unspecified: Secondary | ICD-10-CM

## 2013-12-16 MED ORDER — FUROSEMIDE 40 MG PO TABS: ORAL_TABLET | ORAL | Status: DC

## 2013-12-16 NOTE — Patient Instructions (Addendum)
  If you have swelling, may take 40 mg  For 2 days then alternate 1/2 to 1 tablet every other day for few days until swelling goes down then return to 1/2 tablet.    Your physician wants you to follow-up in 4 month DR HARDING.   You will receive a reminder letter in the mail two months in advance. If you don't receive a letter, please call our office to schedule the follow-up appointment.

## 2013-12-18 ENCOUNTER — Encounter: Payer: Self-pay | Admitting: Cardiology

## 2013-12-18 NOTE — Assessment & Plan Note (Addendum)
No recent recurrence of rapid rate. Rate controlled on combination of Beta Blocker and Calcium Channel Blocker at reduced rate from previous visit. Anticoagulated on warfarin

## 2013-12-18 NOTE — Assessment & Plan Note (Addendum)
More likely this is either venous stasis related or lymphedema. She has no other heart failure symptoms. Continues to be on Lasix which I think she can alternate every other day 40 mg to 20 mg. Perhaps maybe a couple days of taking 40 mg daily to get some edema removed. Once that is stable can return to her baseline dose. Recommend the use of support hose and foot elevation.

## 2013-12-18 NOTE — Assessment & Plan Note (Signed)
Well-controlled. Would not be aggressive with blood pressure control to avoid orthostatic hypotension.

## 2013-12-18 NOTE — Assessment & Plan Note (Signed)
As the wine or beer last visit in June. Continue warfarin following with our [facility].  Target INR is slightly low the normal due to her recent falls. Would shoot for INR of 1.9-2.4.

## 2013-12-18 NOTE — Progress Notes (Signed)
PATIENT: Hayley [name] MRN: [id number]  DOB: 1920-06-14   DOV:12/18/2013 PCP: Gwen Pounds, MD  Clinic Note: Chief Complaint  Patient presents with  . Follow-up    pt denied SOB and chest pain, pt daughter stated the swelling in pt legs have gotten worst.   A HPI: Hayley [name] is a 80 y.o.  female with a PMH below who presents today for 6 month followup of her atrial fibrillation. She was first diagnosed with A. fib back in 2012. She is found to be in A. fib with RVR after a fall tripping on the sidewalk. She has been anticoagulated with warfarin and rate control for quite some time. She has chronic lower extremity edema which has remained stable. Otherwise her cardiac history is stable with well-controlled blood pressure and essentially normal echocardiographic findings. When I last saw her in November 2014, she had recently suffered a fracture. She is starting to get mobility back. She still has falls but no significant bleeding. Just mild bruising.  Interval History:   Besides her edema that seemed to got a little bit worse of late, she really has been stable from cardiac standpoint.  She continues to be oblivious to whether or not she is in A. fib, unless she is in rapid A. fib that leads to dyspnea. She cannot remember the last time that occurred.. She denies any PND, orthopnea. She does not have any recollection whatsoever of being in atrial fibrillation. She does not have any chest discomfort/pressure with rest or exertion. No dyspnea at rest or exertion. No PND or orthopnea although she does have chronic lower extremity edema. No TIA or amaurosis fugax symptoms.  She denies any melena, hematochezia or hematuria. No nosebleeds. No claudication, but still is frequent falls.  Past Medical History  Diagnosis Date  . Chronic ITP (idiopathic thrombocytopenia)   . Chronic edema     BLE  . Thyroid disease     Hypothyroid  . Hypertension     MD took pt off of meds 04/2012  .  Hypothyroidism   . Pelvic fracture October 2014    Recent fall  . Chronic atrial fibrillation     History of RVR June 2012    Prior Cardiac Evaluation and Past Surgical History: Past Surgical History  Procedure Laterality Date  . Shoulder hemi-arthroplasty  06/23/2012    Procedure: SHOULDER HEMI-ARTHROPLASTY;  Surgeon: Harvie Junior, MD;  Location: [facility] OR;  Service: Orthopedics;  Laterality: Right;  . Abdominal hysterectomy      1998  . Cesarean section      1969  . Bunionectomy      2000  . Orif elbow fracture  07/16/2012    Procedure: OPEN REDUCTION INTERNAL FIXATION (ORIF) ELBOW/OLECRANON FRACTURE;  Surgeon: Harvie Junior, MD;  Location: [facility] OR;  Service: Orthopedics;  Laterality: Right;  . Doppler echocardiography  12/2010     mild coccentric LVH but normal diastolic function of age. mild to moderate mtral regurg. moderately dilated right atrium and left atrium.   . Doppler echocardiography    . Event monitor  12/16/2010-01/10/2011    afib c-occ rvr    Allergies  Allergen Reactions  . Sulfa Antibiotics Hives  . Ultram [Tramadol] Other (See Comments)    Hallucinations.    Current Outpatient Prescriptions  Medication Sig Dispense Refill  . ALPRAZolam (XANAX) 0.25 MG tablet Take 0.25 mg by mouth 3 (three) times daily as needed. For anxiety.      . Biotin 5000 MCG  TABS Take 5,000 mg by mouth daily.      . calcium-vitamin D (OSCAL WITH D) 500-200 MG-UNIT per tablet Take 1 tablet by mouth daily.      Marland Kitchen diltiazem (CARDIZEM SR) 120 MG 12 hr capsule Take 1 capsule (120 mg total) by mouth 2 (two) times daily.  60 capsule  11  . Echinacea 400 MG CAPS Take 1 capsule by mouth daily.      Marland Kitchen levothyroxine (SYNTHROID, LEVOTHROID) 100 MCG tablet Take 100 mcg by mouth daily.       . metoprolol tartrate (LOPRESSOR) 25 MG tablet Take 1 tablet (25 mg total) by mouth 2 (two) times daily.  60 tablet  5  . Multiple Vitamin (MULTIVITAMIN WITH MINERALS) TABS Take 1 tablet by mouth daily.      .  potassium chloride SA (K-DUR,KLOR-CON) 20 MEQ tablet Take 10-20 mEq by mouth daily. If patient eats a banana she only takes 0.5 tablet, if not patient takes 1 tablet      . sertraline (ZOLOFT) 100 MG tablet Take 100 mg by mouth daily.       . simvastatin (ZOCOR) 40 MG tablet Take 1 tablet (40 mg total) by mouth every evening.  30 tablet  6  . thiamine (VITAMIN B-1) 100 MG tablet Take 100 mg by mouth daily.      . vitamin C (ASCORBIC ACID) 500 MG tablet Take 500 mg by mouth daily.      . vitamin E 1000 UNIT capsule Take 1,000 Units by mouth daily.      Marland Kitchen warfarin (COUMADIN) 5 MG tablet Take 2.5-5 mg by mouth daily at 6 PM. Takes 2.5 on Mon, Wed, Fri and Sat. Takes 5 mg on all other days      . furosemide (LASIX) 40 MG tablet Take 1/2 to 1 tablet as directed  30 tablet  11   No current facility-administered medications for this visit.    History   Social History Narrative   She is a widowed mother of 4, grandmother of 3.    She has completed her post fracture PT.    Does not smoke, does not drink.    ROS: A comprehensive Review of Systems - Negative except Symptoms noted below.  Has a little worse and get up and go since her fall and pelvic fracture. Not as egar to go for walks.  PHYSICAL EXAM BP 122/84  Pulse 65  Ht 5\' 5"  (1.651 m)  Wt 153 lb 12.8 oz (69.763 kg)  BMI 25.59 kg/m2 General appearance: Very pleasant, healthy-appearing elderly woman. Will nurse and well-groomed.A&O x3; answers questions appropriately. Normal mood and affect. Neck: no adenopathy, no carotid bruit, no JVD, supple, symmetrical, trachea midline and thyroid not enlarged, symmetric, no tenderness/mass/nodules Lungs: clear to auscultation bilaterally, normal percussion bilaterally and Nonlabored, good movement Heart: irregularly irregular rhythm, S1, S2 normal and Soft 1/6 HSM at apex. Nondisplaced PMI. No other M./R./G. Abdomen: soft, non-tender; bowel sounds normal; no masses,  no organomegaly Extremities: edema  Roughly 1-2+ bilaterally. and No evidence of venous stasis ulcers. Pulses: 2+ and symmetric Neurologic: Grossly normal  [id number] today: Yes Rate: 65 , Rhythm: A. fib with controlled ventricular rate. Nonspecific ST and T wave changes. Otherwise no significant changes. Recent Labs: Labs from last week reviewed on Epic  ASSESSMENT / PLAN: Bilateral lower extremity edema More likely this is either venous stasis related or lymphedema. She has no other heart failure symptoms. Continues to be on Lasix which I think she  can alternate every other day 40 mg to 20 mg. Perhaps maybe a couple days of taking 40 mg daily to get some edema removed. Once that is stable can return to her baseline dose. Recommend the use of support hose and foot elevation.  Chronic atrial fibrillation No recent recurrence of rapid rate. Rate controlled on combination of Beta Blocker and Calcium Channel Blocker at reduced rate from previous visit. Anticoagulated on warfarin  Essential hypertension Well-controlled. Would not be aggressive with blood pressure control to avoid orthostatic hypotension.  Long term (current) use of anticoagulants As the wine or beer last visit in June. Continue warfarin following with our [facility].  Target INR is slightly low the normal due to her recent falls. Would shoot for INR of 1.9-2.4.    Orders Placed This Encounter  Procedures  . EKG 12-Lead   Meds ordered this encounter  Medications  . furosemide (LASIX) 40 MG tablet    Sig: Take 1/2 to 1 tablet as directed    Dispense:  30 tablet    Refill:  11    Followup: 6 months  Mercia Dowe W. Herbie BaltimoreHARDING, M.D., M.S. [facility] [address] [place], [place]  [address]  [phone] Pager # [phone]

## 2013-12-31 ENCOUNTER — Ambulatory Visit: Payer: Self-pay | Admitting: Pharmacist Clinician (PhC)/ Clinical Pharmacy Specialist

## 2014-01-04 ENCOUNTER — Telehealth: Payer: Self-pay | Admitting: Pharmacist Clinician (PhC)/ Clinical Pharmacy Specialist

## 2014-01-04 NOTE — Telephone Encounter (Signed)
Closed encounter °

## 2014-01-11 ENCOUNTER — Ambulatory Visit (INDEPENDENT_AMBULATORY_CARE_PROVIDER_SITE_OTHER): Payer: Medicare Other | Admitting: Pharmacist Clinician (PhC)/ Clinical Pharmacy Specialist

## 2014-01-11 DIAGNOSIS — Z7901 Long term (current) use of anticoagulants: Secondary | ICD-10-CM

## 2014-01-11 DIAGNOSIS — I4891 Unspecified atrial fibrillation: Secondary | ICD-10-CM

## 2014-01-11 LAB — POCT INR: INR: 3.4

## 2014-01-17 ENCOUNTER — Emergency Department (HOSPITAL_COMMUNITY): Payer: Medicare Other

## 2014-01-17 ENCOUNTER — Inpatient Hospital Stay (HOSPITAL_COMMUNITY)
Admission: EM | Admit: 2014-01-17 | Discharge: 2014-01-19 | DRG: 069 | Disposition: A | Discharge status: Home Health | Payer: Medicare Other | Attending: Internal Medicine | Admitting: Internal Medicine

## 2014-01-17 ENCOUNTER — Encounter (HOSPITAL_COMMUNITY): Payer: Self-pay | Admitting: Radiology

## 2014-01-17 DIAGNOSIS — I10 Essential (primary) hypertension: Secondary | ICD-10-CM | POA: Diagnosis present

## 2014-01-17 DIAGNOSIS — Z9181 History of falling: Secondary | ICD-10-CM | POA: Diagnosis not present

## 2014-01-17 DIAGNOSIS — I4891 Unspecified atrial fibrillation: Secondary | ICD-10-CM | POA: Diagnosis present

## 2014-01-17 DIAGNOSIS — E039 Hypothyroidism, unspecified: Secondary | ICD-10-CM | POA: Diagnosis present

## 2014-01-17 DIAGNOSIS — D696 Thrombocytopenia, unspecified: Secondary | ICD-10-CM | POA: Diagnosis present

## 2014-01-17 DIAGNOSIS — Z79899 Other long term (current) drug therapy: Secondary | ICD-10-CM | POA: Diagnosis not present

## 2014-01-17 DIAGNOSIS — Z886 Allergy status to analgesic agent status: Secondary | ICD-10-CM

## 2014-01-17 DIAGNOSIS — R4701 Aphasia: Secondary | ICD-10-CM | POA: Diagnosis present

## 2014-01-17 DIAGNOSIS — Z881 Allergy status to other antibiotic agents status: Secondary | ICD-10-CM

## 2014-01-17 DIAGNOSIS — G250 Essential tremor: Secondary | ICD-10-CM | POA: Diagnosis present

## 2014-01-17 DIAGNOSIS — G458 Other transient cerebral ischemic attacks and related syndromes: Secondary | ICD-10-CM | POA: Diagnosis not present

## 2014-01-17 DIAGNOSIS — Z96619 Presence of unspecified artificial shoulder joint: Secondary | ICD-10-CM | POA: Diagnosis not present

## 2014-01-17 DIAGNOSIS — E785 Hyperlipidemia, unspecified: Secondary | ICD-10-CM | POA: Diagnosis present

## 2014-01-17 DIAGNOSIS — Z7901 Long term (current) use of anticoagulants: Secondary | ICD-10-CM | POA: Diagnosis not present

## 2014-01-17 DIAGNOSIS — G459 Transient cerebral ischemic attack, unspecified: Secondary | ICD-10-CM | POA: Diagnosis present

## 2014-01-17 DIAGNOSIS — G252 Other specified forms of tremor: Secondary | ICD-10-CM

## 2014-01-17 LAB — COMPREHENSIVE METABOLIC PANEL
ALBUMIN: 3.8 g/dL (ref 3.5–5.2)
ALT: 11 U/L (ref 0–35)
AST: 21 U/L (ref 0–37)
Alkaline Phosphatase: 105 U/L (ref 39–117)
Anion gap: 15 (ref 5–15)
BUN: 11 mg/dL (ref 6–23)
CALCIUM: 9.8 mg/dL (ref 8.4–10.5)
CO2: 26 mEq/L (ref 19–32)
CREATININE: 0.64 mg/dL (ref 0.50–1.10)
Chloride: 96 mEq/L (ref 96–112)
GFR calc Af Amer: 88 mL/min — ABNORMAL LOW (ref >90)
GFR calc non Af Amer: 76 mL/min — ABNORMAL LOW (ref >90)
Glucose, Bld: 161 mg/dL — ABNORMAL HIGH (ref 70–99)
Potassium: 3.7 mEq/L (ref 3.7–5.3)
SODIUM: 137 meq/L (ref 137–147)
Total Bilirubin: 0.3 mg/dL (ref 0.3–1.2)
Total Protein: 7.5 g/dL (ref 6.0–8.3)

## 2014-01-17 LAB — CBC
HEMATOCRIT: 43.3 % (ref 36.0–46.0)
Hemoglobin: 14.7 g/dL (ref 12.0–15.0)
MCH: 29.7 pg (ref 26.0–34.0)
MCHC: 33.9 g/dL (ref 30.0–36.0)
MCV: 87.5 fL (ref 78.0–100.0)
PLATELETS: 170 10*3/uL (ref 150–400)
RBC: 4.95 MIL/uL (ref 3.87–5.11)
RDW: 13.2 % (ref 11.5–15.5)
WBC: 10.3 10*3/uL (ref 4.0–10.5)

## 2014-01-17 LAB — I-STAT CHEM 8, ED
BUN: 9 mg/dL (ref 6–23)
CALCIUM ION: 1.17 mmol/L (ref 1.13–1.30)
CREATININE: 0.7 mg/dL (ref 0.50–1.10)
Chloride: 99 mEq/L (ref 96–112)
GLUCOSE: 153 mg/dL — AB (ref 70–99)
HCT: 46 % (ref 36.0–46.0)
HEMOGLOBIN: 15.6 g/dL — AB (ref 12.0–15.0)
POTASSIUM: 3.5 meq/L — AB (ref 3.7–5.3)
Sodium: 138 mEq/L (ref 137–147)
TCO2: 29 mmol/L (ref 0–100)

## 2014-01-17 LAB — CBG MONITORING, ED: GLUCOSE-CAPILLARY: 147 mg/dL — AB (ref 70–99)

## 2014-01-17 LAB — DIFFERENTIAL
BASOS ABS: 0 10*3/uL (ref 0.0–0.1)
BASOS PCT: 0 % (ref 0–1)
Eosinophils Absolute: 0.2 10*3/uL (ref 0.0–0.7)
Eosinophils Relative: 2 % (ref 0–5)
LYMPHS PCT: 25 % (ref 12–46)
Lymphs Abs: 2.5 10*3/uL (ref 0.7–4.0)
MONO ABS: 0.8 10*3/uL (ref 0.1–1.0)
Monocytes Relative: 8 % (ref 3–12)
Neutro Abs: 6.7 10*3/uL (ref 1.7–7.7)
Neutrophils Relative %: 65 % (ref 43–77)

## 2014-01-17 LAB — APTT: aPTT: 47 seconds — ABNORMAL HIGH (ref 24–37)

## 2014-01-17 LAB — PROTIME-INR
INR: 2.91 — ABNORMAL HIGH (ref 0.00–1.49)
PROTHROMBIN TIME: 30.4 s — AB (ref 11.6–15.2)

## 2014-01-17 LAB — I-STAT TROPONIN, ED: Troponin i, poc: 0.01 ng/mL (ref 0.00–0.08)

## 2014-01-17 MED ORDER — SERTRALINE HCL 100 MG PO TABS
100.0000 mg | ORAL_TABLET | Freq: Every day | ORAL | Status: DC
  Administered 2014-01-18 – 2014-01-19 (×2): 100 mg via ORAL
  Filled 2014-01-17 (×2): qty 1

## 2014-01-17 MED ORDER — WARFARIN SODIUM 5 MG PO TABS
5.0000 mg | ORAL_TABLET | ORAL | Status: DC
  Administered 2014-01-17: 5 mg via ORAL
  Filled 2014-01-17: qty 1

## 2014-01-17 MED ORDER — DOCUSATE SODIUM 100 MG PO CAPS
100.0000 mg | ORAL_CAPSULE | Freq: Two times a day (BID) | ORAL | Status: DC
  Administered 2014-01-17 – 2014-01-19 (×3): 100 mg via ORAL
  Filled 2014-01-17 (×3): qty 1

## 2014-01-17 MED ORDER — ACETAMINOPHEN 325 MG PO TABS: 650.0000 mg | ORAL_TABLET | Freq: Four times a day (QID) | ORAL | Status: DC | PRN

## 2014-01-17 MED ORDER — ACETAMINOPHEN 650 MG RE SUPP: 650.0000 mg | Freq: Four times a day (QID) | RECTAL | Status: DC | PRN

## 2014-01-17 MED ORDER — WARFARIN - PHARMACIST DOSING INPATIENT: Freq: Every day | Status: DC

## 2014-01-17 MED ORDER — ALPRAZOLAM 0.25 MG PO TABS
0.2500 mg | ORAL_TABLET | Freq: Three times a day (TID) | ORAL | Status: DC | PRN
  Administered 2014-01-18 (×2): 0.25 mg via ORAL
  Filled 2014-01-17 (×2): qty 1

## 2014-01-17 MED ORDER — DILTIAZEM HCL ER 60 MG PO CP12
120.0000 mg | ORAL_CAPSULE | Freq: Two times a day (BID) | ORAL | Status: DC
  Administered 2014-01-17 – 2014-01-19 (×4): 120 mg via ORAL
  Filled 2014-01-17 (×5): qty 2

## 2014-01-17 MED ORDER — SIMVASTATIN 40 MG PO TABS
40.0000 mg | ORAL_TABLET | Freq: Every evening | ORAL | Status: DC
  Administered 2014-01-17: 40 mg via ORAL
  Filled 2014-01-17: qty 1

## 2014-01-17 MED ORDER — LEVOTHYROXINE SODIUM 100 MCG PO TABS
100.0000 ug | ORAL_TABLET | Freq: Every day | ORAL | Status: DC
  Administered 2014-01-18 – 2014-01-19 (×2): 100 ug via ORAL
  Filled 2014-01-17 (×2): qty 1

## 2014-01-17 MED ORDER — POTASSIUM CHLORIDE CRYS ER 10 MEQ PO TBCR
10.0000 meq | EXTENDED_RELEASE_TABLET | Freq: Every day | ORAL | Status: DC
  Administered 2014-01-18 – 2014-01-19 (×2): 10 meq via ORAL
  Filled 2014-01-17 (×2): qty 1

## 2014-01-17 MED ORDER — IOHEXOL 350 MG/ML SOLN
100.0000 mL | Freq: Once | INTRAVENOUS | Status: AC | PRN
  Administered 2014-01-17: 100 mL via INTRAVENOUS

## 2014-01-17 MED ORDER — ONDANSETRON HCL 4 MG/2ML IJ SOLN: 4.0000 mg | Freq: Four times a day (QID) | INTRAMUSCULAR | Status: DC | PRN

## 2014-01-17 MED ORDER — WARFARIN SODIUM 5 MG PO TABS: 2.5000 mg | ORAL_TABLET | ORAL | Status: DC

## 2014-01-17 MED ORDER — FUROSEMIDE 20 MG PO TABS
20.0000 mg | ORAL_TABLET | Freq: Two times a day (BID) | ORAL | Status: DC
  Administered 2014-01-18 – 2014-01-19 (×3): 20 mg via ORAL
  Filled 2014-01-17 (×3): qty 1

## 2014-01-17 MED ORDER — ONDANSETRON HCL 4 MG PO TABS: 4.0000 mg | ORAL_TABLET | Freq: Four times a day (QID) | ORAL | Status: DC | PRN

## 2014-01-17 MED ORDER — METOPROLOL TARTRATE 25 MG PO TABS
25.0000 mg | ORAL_TABLET | Freq: Two times a day (BID) | ORAL | Status: DC
  Administered 2014-01-17 – 2014-01-19 (×4): 25 mg via ORAL
  Filled 2014-01-17 (×4): qty 1

## 2014-01-17 MED ORDER — SODIUM CHLORIDE 0.9 % IJ SOLN
3.0000 mL | Freq: Two times a day (BID) | INTRAMUSCULAR | Status: DC
  Administered 2014-01-17 – 2014-01-18 (×3): 3 mL via INTRAVENOUS

## 2014-01-17 NOTE — ED Notes (Signed)
Patient leaving at this time with [facility].

## 2014-01-17 NOTE — Consult Note (Signed)
Neurology Consultation Reason for Consult: Aphasia Referring Physician: Silverio LayYao, D  CC: Aphasia  History is obtained from: Patient  HPI: Hayley [name] is a 80 y.o. female with a history of atrial fibrillation who presents with aphasia that began after a nap. She lay down around 11 AM and then subsequently awoke around noon with aphasia. Initially, she had a dense aphasia with gibberish per the daughter. This has improved somewhat, but she still has significant difficulty with speech.  LKW: 11 AM tpa given?: no, outside of window   ROS: A 14 point ROS was performed and is negative except as noted in the HPI.   Past Medical History  Diagnosis Date  . Chronic ITP (idiopathic thrombocytopenia)   . Chronic edema     BLE  . Thyroid disease     Hypothyroid  . Hypertension     MD took pt off of meds 04/2012  . Hypothyroidism   . Pelvic fracture October 2014    Recent fall  . Chronic atrial fibrillation     History of RVR June 2012    Family History: Sister-stroke  Social History: Tob: Denies  Exam: Current vital signs: BP 114/99  Pulse 88  Temp(Src) 97.4 F (36.3 C) (Oral)  SpO2 100% Vital signs in last 24 hours: Temp:  [97.4 F (36.3 C)-98.1 F (36.7 C)] 97.4 F (36.3 C) (08/09 1739) Pulse Rate:  [88] 88 (08/09 1557) BP: (114)/(99) 114/99 mmHg (08/09 1557) SpO2:  [100 %] 100 % (08/09 1557)  General: In bed, NAD CV: Normal rate Mental Status: Patient is awake, alert, oriented to person, place, month. Gives age as [age] Immediate and remote memory are intact. Patient is able to give a clear and coherent history. No signs of neglect. She has a mild to moderate expressive aphasia Cranial Nerves: II: Visual Fields are full. Pupils are equal, round, and reactive to light.  Discs are difficult to visualize. III,IV, VI: EOMI without ptosis or diploplia.  V: Facial sensation is symmetric to temperature VII: Facial movement is symmetric.  VIII: hearing is intact to  voice X: Uvula elevates symmetrically XI: Shoulder shrug is symmetric. XII: tongue is midline without atrophy or fasciculations.  Motor: Tone is normal. Bulk is normal. 5/5 strength was present in all four extremities.  Sensory: Sensation is symmetric to light touch and temperature in the arms and legs. Deep Tendon Reflexes: 2+ and symmetric in the biceps and patellae.  Plantars: Toes are downgoing bilaterally.  Cerebellar: She is intentional tremor bilaterally Gait: Not tested secondary to patient safety concerns  I have reviewed labs in epic and the results pertinent to this consultation are: Chem-8-unremarkable other than mildly low potassium  I have reviewed the images obtained: CT head-negative  Impression: 80 year old female with expressive aphasia and him I suspect she has had a small stroke. Her INR is therapeutic, but other stroke risk factors could be assessed I would favor observation overnight.  Recommendations: 1) MRI brain 2) RN stroke swallow screen 3) frequent neuro checks 4) continue anticoagulation with Coumadin 5) ST evaluation.  6) Lipid panel, A1c   Ritta SlotMcNeill Alcus Bradly, MD [facility] [phone]  If 7pm- 7am, please page neurology on call as listed in AMION.

## 2014-01-17 NOTE — H&P (Signed)
Hayley [name] is an 80 y.o. female.   Chief Complaint: slurred speech HPI: Hayley [name] is a pleasant 80 yo female. She lives at home with her daughter and granddaughter.  She was in her [place] until she woke from a nap around noon today.  She had an expressive aphasia.  No motor deficits noted but her words were just jibberish.  This lasted in all  About 3 hours.  She is now back to her baseline.  CT scan head shows no acute problem.  BUt, she will require admission for further eval.    Past Medical History  Diagnosis Date  . Chronic ITP (idiopathic thrombocytopenia)   . Chronic edema     BLE  . Thyroid disease     Hypothyroid  . Hypertension     MD took pt off of meds 04/2012  . Hypothyroidism   . Pelvic fracture October 2014    Recent fall  . Chronic atrial fibrillation-since 2012     History of RVR June 2012    Past Surgical History  Procedure Laterality Date  . Shoulder hemi-arthroplasty  06/23/2012    Procedure: SHOULDER HEMI-ARTHROPLASTY;  Surgeon: Alta Corning, MD;  Location: [facility];  Service: Orthopedics;  Laterality: Right;  . Abdominal hysterectomy      1998  . Cesarean section      1969  . Bunionectomy      2000  . Orif elbow fracture  07/16/2012    Procedure: OPEN REDUCTION INTERNAL FIXATION (ORIF) ELBOW/OLECRANON FRACTURE;  Surgeon: Alta Corning, MD;  Location: [facility] [place];  Service: Orthopedics;  Laterality: Right;  . Doppler echocardiography  12/2010     mild coccentric LVH but normal diastolic function of age. mild to moderate mtral regurg. moderately dilated right atrium and left atrium.   . Doppler echocardiography    . Event monitor  12/16/2010-01/10/2011    afib c-occ rvr    Family History  Problem Relation Age of Onset  . Stomach cancer Mother   . Diabetes Mother   . Heart attack Father   . Leukemia Sister   . Stroke Brother   . Stroke Sister    Social History:  reports that she has never smoked. She does not have any smokeless tobacco history on file. She  reports that she does not drink alcohol or use illicit drugs.  Home meds:  klor-con 20 meq one daily Diltiazem  120- mg ER one po bid Simvastatin 40 mg qhs Furosemide 20 mg one po bid Levothyroxine  unkown dose  Coumadin  2.5 mg mon, wed, Friday, sat,   5 mg sun, tues, thurs Metoprolol 25 mg bid zoloft 100 mg daily  xanax 0.25 mg tid prn  Allergies:  Allergies  Allergen Reactions  . Sulfa Antibiotics Hives  . Ultram [Tramadol] Other (See Comments)    Hallucinations.      Results for orders placed during the hospital encounter of 01/17/14 (from the past 48 hour(s))  PROTIME-INR     Status: Abnormal   Collection Time    01/17/14  4:06 PM      Result Value Ref Range   Prothrombin Time 30.4 (*) 11.6 - 15.2 seconds   INR 2.91 (*) 0.00 - 1.49  APTT     Status: Abnormal   Collection Time    01/17/14  4:06 PM      Result Value Ref Range   aPTT 47 (*) 24 - 37 seconds   Comment:  IF BASELINE aPTT IS ELEVATED,     SUGGEST PATIENT RISK ASSESSMENT     BE USED TO DETERMINE APPROPRIATE     ANTICOAGULANT THERAPY.  CBC     Status: None   Collection Time    01/17/14  4:06 PM      Result Value Ref Range   WBC 10.3  4.0 - 10.5 K/uL   RBC 4.95  3.87 - 5.11 MIL/uL   Hemoglobin 14.7  12.0 - 15.0 g/dL   HCT 43.3  36.0 - 46.0 %   MCV 87.5  78.0 - 100.0 fL   MCH 29.7  26.0 - 34.0 pg   MCHC 33.9  30.0 - 36.0 g/dL   RDW 13.2  11.5 - 15.5 %   Platelets 170  150 - 400 K/uL  DIFFERENTIAL     Status: None   Collection Time    01/17/14  4:06 PM      Result Value Ref Range   Neutrophils Relative % 65  43 - 77 %   Neutro Abs 6.7  1.7 - 7.7 K/uL   Lymphocytes Relative 25  12 - 46 %   Lymphs Abs 2.5  0.7 - 4.0 K/uL   Monocytes Relative 8  3 - 12 %   Monocytes Absolute 0.8  0.1 - 1.0 K/uL   Eosinophils Relative 2  0 - 5 %   Eosinophils Absolute 0.2  0.0 - 0.7 K/uL   Basophils Relative 0  0 - 1 %   Basophils Absolute 0.0  0.0 - 0.1 K/uL  COMPREHENSIVE METABOLIC PANEL      Status: Abnormal   Collection Time    01/17/14  4:06 PM      Result Value Ref Range   Sodium 137  137 - 147 mEq/L   Potassium 3.7  3.7 - 5.3 mEq/L   Chloride 96  96 - 112 mEq/L   CO2 26  19 - 32 mEq/L   Glucose, Bld 161 (*) 70 - 99 mg/dL   BUN 11  6 - 23 mg/dL   Creatinine, Ser 0.64  0.50 - 1.10 mg/dL   Calcium 9.8  8.4 - 10.5 mg/dL   Total Protein 7.5  6.0 - 8.3 g/dL   Albumin 3.8  3.5 - 5.2 g/dL   AST 21  0 - 37 U/L   Comment: SLIGHT HEMOLYSIS     HEMOLYSIS AT THIS LEVEL MAY AFFECT RESULT   ALT 11  0 - 35 U/L   Alkaline Phosphatase 105  39 - 117 U/L   Total Bilirubin 0.3  0.3 - 1.2 mg/dL   GFR calc non Af Amer 76 (*) >90 mL/min   GFR calc Af Amer 88 (*) >90 mL/min   Comment: (NOTE)     The eGFR has been calculated using the CKD EPI equation.     This calculation has not been validated in all clinical situations.     eGFR's persistently <90 mL/min signify possible Chronic Kidney     Disease.   Anion gap 15  5 - 15  CBG MONITORING, ED     Status: Abnormal   Collection Time    01/17/14  4:09 PM      Result Value Ref Range   Glucose-Capillary 147 (*) 70 - 99 mg/dL  I-STAT TROPOININ, ED     Status: None   Collection Time    01/17/14  4:14 PM      Result Value Ref Range   Troponin i, poc 0.01  0.00 -  0.08 ng/mL   Comment 3            Comment: Due to the release kinetics of cTnI,     a negative result within the first hours     of the onset of symptoms does not rule out     myocardial infarction with certainty.     If myocardial infarction is still suspected,     repeat the test at appropriate intervals.  I-STAT CHEM 8, ED     Status: Abnormal   Collection Time    01/17/14  4:16 PM      Result Value Ref Range   Sodium 138  137 - 147 mEq/L   Potassium 3.5 (*) 3.7 - 5.3 mEq/L   Chloride 99  96 - 112 mEq/L   BUN 9  6 - 23 mg/dL   Creatinine, Ser 0.70  0.50 - 1.10 mg/dL   Glucose, Bld 153 (*) 70 - 99 mg/dL   Calcium, Ion 1.17  1.13 - 1.30 mmol/L   TCO2 29  0 - 100  mmol/L   Hemoglobin 15.6 (*) 12.0 - 15.0 g/dL   HCT 46.0  36.0 - 46.0 %   Ct Angio Head W/cm &/or Wo Cm  01/17/2014   CLINICAL DATA:  80 year old female with slurred speech, last seen normal at 1100 hrs. Code stroke. Initial encounter.  EXAM: CT ANGIOGRAPHY HEAD AND NECK  TECHNIQUE: Multidetector CT imaging of the head and neck was performed using the standard protocol during bolus administration of intravenous contrast. Multiplanar CT image reconstructions and MIPs were obtained to evaluate the vascular anatomy. Carotid stenosis measurements (when applicable) are obtained utilizing NASCET criteria, using the distal internal carotid diameter as the denominator.  CONTRAST:  136m OMNIPAQUE IOHEXOL 350 MG/ML SOLN  COMPARISON:  Head CT without contrast 1605 hr.  FINDINGS: CTA HEAD FINDINGS  No abnormal enhancement identified. Stable cerebral white matter hypodensity. No evidence of cortically based acute infarction identified. No intracranial mass effect.  VASCULAR FINDINGS:  Major intracranial venous structures are normally enhancing.  Codominant distal vertebral arteries are normal. Normal vertebrobasilar junction. Normal PICA origins. No basilar artery stenosis. Normal SCA origins. PCA origins are within normal limits. Diminutive left posterior communicating artery, the right is more diminutive or absent. The left PCA branches are patent but appear mildly diminutive compared to those on the right (series 12, image 25 versus image 19). However, no PCA stenosis is identified.  Both ICA siphons are patent with minimal calcified plaque, and no ICA siphon stenosis. Ophthalmic and left posterior communicating artery origins are within normal limits. Carotid termini are normal. MCA and ACA origins are within normal limits. Anterior communicating artery and bilateral ACA branches within normal limits. Normal left MCA origin. Left MCA branches are within normal limits. Normal right MCA origin. Subtle decreased  attenuation of the right MCA branches, similar to the left PCA findings, but no right MCA stenosis or major branch occlusion identified.  Review of the MIP images confirms the above findings.  CTA NECK FINDINGS  Negative lung apices. Thyromegaly with mild retrosternal extension. Streak artifact at the thoracic inlet related to right shoulder arthroplasty hardware. Negative larynx, pharynx, parapharyngeal spaces, retropharyngeal space, sublingual space, submandibular glands, parotid glands, and orbits soft tissues (postoperative changes to the globes. Visualized paranasal sinuses and mastoids are clear. Degenerative osseous changes in the spine. No acute osseous abnormality identified. No cervical lymphadenopathy.  VASCULAR FINDINGS:  Dolichoectatic distal aortic arch. The great vessel origins are not entirely included on  these images. Mild for age calcified plaque in the arch.  The right CCA origin is included and normal. Negative right CCA and right carotid bifurcation. Negative right ICA origin and bulb. Severe tortuosity of the cervical right ICA distal to the bulb, and widespread beaded appearance (see series 9, image 123) in keeping with fibromuscular dysplasia. The right ICA is patent to the skullbase.  No proximal right subclavian artery stenosis. Normal right vertebral artery origin. Negative cervical right vertebral artery.  Left CCA origin not included. Left CCA is patent negative left carotid bifurcation. Negative left ICA origin and bulb. Similar to the right side, severe tortuosity of the post bulb are cervical left ICA. However, subtle if any beaded appearance on this side. Otherwise negative cervical left ICA.  The left subclavian origin is included. No proximal left subclavian artery stenosis. Mild calcified plaque at the left vertebral artery origin without stenosis. Some obscuration of the proximal left vertebral artery due to the streak artifact at the thoracic inlet plus dense surrounding  paravertebral venous contrast. Otherwise, negative cervical left vertebral artery.  Review of the MIP images confirms the above findings.  IMPRESSION: 1. Negative for age neck CTA except for tortuous bilateral cervical ICAs, and right ICA fibromuscular dysplasia. 2. Essentially negative intracranial CTA; subtle decreased enhancement in the left PCA and right MCA branches may represent normal anatomic variation. No intracranial artery stenosis or occlusion identified. 3. Stable CT appearance of the brain. No changes of acute cortically based infarct are identified.   Electronically Signed   By: Lars Pinks M.D.   On: 01/17/2014 16:56   Ct Head Wo Contrast  01/17/2014   CLINICAL DATA:  Code stroke, slurred speech  EXAM: CT HEAD WITHOUT CONTRAST  TECHNIQUE: Contiguous axial images were obtained from the base of the skull through the vertex without intravenous contrast.  COMPARISON:  03/25/2013  FINDINGS: No evidence of parenchymal hemorrhage or extra-axial fluid collection. No mass lesion, mass effect, or midline shift.  No CT evidence of acute infarction.  Extensive small vessel ischemic changes, including in the subcortical right frontal lobe. Intracranial atherosclerosis.  Global cortical atrophy, likely age appropriate. No ventriculomegaly.  The visualized paranasal sinuses are essentially clear. The mastoid air cells are unopacified.  No evidence of calvarial fracture.  IMPRESSION: No evidence of acute intracranial abnormality.  Atrophy with small vessel ischemic changes and intracranial atherosclerosis.  These results were called by telephone at the time of interpretation on 01/17/2014 at 4:16 pm to Dr. Shirlyn Goltz , who verbally acknowledged these results.   Electronically Signed   By: Julian Hy M.D.   On: 01/17/2014 16:16   Ct Angio Neck W/cm &/or Wo/cm  01/17/2014   CLINICAL DATA:  80 year old female with slurred speech, last seen normal at 1100 hrs. Code stroke. Initial encounter.  EXAM: CT ANGIOGRAPHY  HEAD AND NECK  TECHNIQUE: Multidetector CT imaging of the head and neck was performed using the standard protocol during bolus administration of intravenous contrast. Multiplanar CT image reconstructions and MIPs were obtained to evaluate the vascular anatomy. Carotid stenosis measurements (when applicable) are obtained utilizing NASCET criteria, using the distal internal carotid diameter as the denominator.  CONTRAST:  166m OMNIPAQUE IOHEXOL 350 MG/ML SOLN  COMPARISON:  Head CT without contrast 1605 hr.  FINDINGS: CTA HEAD FINDINGS  No abnormal enhancement identified. Stable cerebral white matter hypodensity. No evidence of cortically based acute infarction identified. No intracranial mass effect.  VASCULAR FINDINGS:  Major intracranial venous structures are normally enhancing.  Codominant distal vertebral arteries are normal. Normal vertebrobasilar junction. Normal PICA origins. No basilar artery stenosis. Normal SCA origins. PCA origins are within normal limits. Diminutive left posterior communicating artery, the right is more diminutive or absent. The left PCA branches are patent but appear mildly diminutive compared to those on the right (series 12, image 25 versus image 19). However, no PCA stenosis is identified.  Both ICA siphons are patent with minimal calcified plaque, and no ICA siphon stenosis. Ophthalmic and left posterior communicating artery origins are within normal limits. Carotid termini are normal. MCA and ACA origins are within normal limits. Anterior communicating artery and bilateral ACA branches within normal limits. Normal left MCA origin. Left MCA branches are within normal limits. Normal right MCA origin. Subtle decreased attenuation of the right MCA branches, similar to the left PCA findings, but no right MCA stenosis or major branch occlusion identified.  Review of the MIP images confirms the above findings.  CTA NECK FINDINGS  Negative lung apices. Thyromegaly with mild retrosternal  extension. Streak artifact at the thoracic inlet related to right shoulder arthroplasty hardware. Negative larynx, pharynx, parapharyngeal spaces, retropharyngeal space, sublingual space, submandibular glands, parotid glands, and orbits soft tissues (postoperative changes to the globes. Visualized paranasal sinuses and mastoids are clear. Degenerative osseous changes in the spine. No acute osseous abnormality identified. No cervical lymphadenopathy.  VASCULAR FINDINGS:  Dolichoectatic distal aortic arch. The great vessel origins are not entirely included on these images. Mild for age calcified plaque in the arch.  The right CCA origin is included and normal. Negative right CCA and right carotid bifurcation. Negative right ICA origin and bulb. Severe tortuosity of the cervical right ICA distal to the bulb, and widespread beaded appearance (see series 9, image 123) in keeping with fibromuscular dysplasia. The right ICA is patent to the skullbase.  No proximal right subclavian artery stenosis. Normal right vertebral artery origin. Negative cervical right vertebral artery.  Left CCA origin not included. Left CCA is patent negative left carotid bifurcation. Negative left ICA origin and bulb. Similar to the right side, severe tortuosity of the post bulb are cervical left ICA. However, subtle if any beaded appearance on this side. Otherwise negative cervical left ICA.  The left subclavian origin is included. No proximal left subclavian artery stenosis. Mild calcified plaque at the left vertebral artery origin without stenosis. Some obscuration of the proximal left vertebral artery due to the streak artifact at the thoracic inlet plus dense surrounding paravertebral venous contrast. Otherwise, negative cervical left vertebral artery.  Review of the MIP images confirms the above findings.  IMPRESSION: 1. Negative for age neck CTA except for tortuous bilateral cervical ICAs, and right ICA fibromuscular dysplasia. 2.  Essentially negative intracranial CTA; subtle decreased enhancement in the left PCA and right MCA branches may represent normal anatomic variation. No intracranial artery stenosis or occlusion identified. 3. Stable CT appearance of the brain. No changes of acute cortically based infarct are identified.   Electronically Signed   By: Lars Pinks M.D.   On: 01/17/2014 16:56   Lab Results  Component Value Date   INR 2.91* 01/17/2014   INR 3.4 01/11/2014   INR 2.6 11/13/2013   PROTIME 30.0* 04/16/2011     XQJ:JHER unsteady, is  At risk for falls. Uses a walker or cane at home.   appetitie good. Denies pain.  Bowel and bladder okay. No blood noted.  Blood pressure 114/99, pulse 88, temperature 97.4 F (36.3 C), temperature source Oral, SpO2 100.00%.  age approp female, semi-supine in nad,  no jvd, no pallor.  no icterus.  moe times 4. grossly NL strength.  lungs cta bilat. on w/r/r. heart rrr no m/r/g.  abd soft, nt, nd, no mass or hsm.  Speech NL.  No pronator drift. NL dtr's upper and LE bilat.  Assessment/Plan 80 yo female with tia vs. Stroke.  Admit, mri brain, carotid doppler.  Defer to neurology whether or not to add 81 mg asa to her regimen.  She is at fall risk so just continuing her current meds may be the most prudent choice.  She desires a DNR order.     Jerlyn Ly, MD 01/17/2014, 5:58 PM

## 2014-01-17 NOTE — ED Notes (Signed)
Pt from home.  Daughters state that she was fine at 1100 but laid down for a nap, woke up at 1230 and daughters state that when she woke up, her words were slurred and they were not able to understand what she is saying.  Pt is now A&O w/ no slurred speech, equal grips, no drift, no aphasia, no facial palsy, etc.  NIH of 1 (during LOC questions, pt answered that she was 89 instead of 89).

## 2014-01-17 NOTE — ED Provider Notes (Signed)
CSN: [id number]     Arrival date & time 01/17/14  1546 History   First MD Initiated Contact with Patient 01/17/14 1552     Chief Complaint  Patient presents with  . Code Stroke     (Consider location/radiation/quality/duration/timing/severity/associated sxs/prior Treatment) The history is provided by the patient and a relative.  Hayley [name] is a 80 y.o. female hx of ITP, HTN, afib on coumadin, here with slurred speech. Last seen normal by daughter at 10:30 AM. She ate breakfast. Then took a nap around 11 AM. Woke up at 12:30 PM then had slurred speech and trouble speaking. Denies weakness or numbness or facial droop.   Level V caveat- AMS     Past Medical History  Diagnosis Date  . Chronic ITP (idiopathic thrombocytopenia)   . Chronic edema     BLE  . Thyroid disease     Hypothyroid  . Hypertension     MD took pt off of meds 04/2012  . Hypothyroidism   . Pelvic fracture October 2014    Recent fall  . Chronic atrial fibrillation     History of RVR June 2012   Past Surgical History  Procedure Laterality Date  . Shoulder hemi-arthroplasty  06/23/2012    Procedure: SHOULDER HEMI-ARTHROPLASTY;  Surgeon: Harvie Junior, MD;  Location: [facility] OR;  Service: Orthopedics;  Laterality: Right;  . Abdominal hysterectomy      1998  . Cesarean section      1969  . Bunionectomy      2000  . Orif elbow fracture  07/16/2012    Procedure: OPEN REDUCTION INTERNAL FIXATION (ORIF) ELBOW/OLECRANON FRACTURE;  Surgeon: Harvie Junior, MD;  Location: [facility] OR;  Service: Orthopedics;  Laterality: Right;  . Doppler echocardiography  12/2010     mild coccentric LVH but normal diastolic function of age. mild to moderate mtral regurg. moderately dilated right atrium and left atrium.   . Doppler echocardiography    . Event monitor  12/16/2010-01/10/2011    afib c-occ rvr   Family History  Problem Relation Age of Onset  . Stomach cancer Mother   . Diabetes Mother   . Heart attack Father   . Leukemia  Sister   . Stroke Brother   . Stroke Sister    History  Substance Use Topics  . Smoking status: Never Smoker   . Smokeless tobacco: Not on file  . Alcohol Use: No   OB History   Grav Para Term Preterm Abortions TAB SAB Ect Mult Living                 Review of Systems  Neurological: Positive for speech difficulty.  All other systems reviewed and are negative.     Allergies  Sulfa antibiotics and Ultram  Home Medications   Prior to Admission medications   Medication Sig Start Date End Date Taking? Authorizing Provider  ALPRAZolam (XANAX) 0.25 MG tablet Take 0.25 mg by mouth 3 (three) times daily as needed. For anxiety.   Yes Historical Provider, MD  Biotin 5000 MCG TABS Take 5,000 mg by mouth daily.   Yes Historical Provider, MD  calcium-vitamin D (OSCAL WITH D) 500-200 MG-UNIT per tablet Take 1 tablet by mouth daily.   Yes Historical Provider, MD  diltiazem (CARDIZEM SR) 120 MG 12 hr capsule Take 1 capsule (120 mg total) by mouth 2 (two) times daily. 04/22/13  Yes Marykay Lex, MD  Echinacea 400 MG CAPS Take 1 capsule by mouth daily.  Yes Historical Provider, MD  furosemide (LASIX) 20 MG tablet Take 20 mg by mouth 2 (two) times daily.   Yes Historical Provider, MD  levothyroxine (SYNTHROID, LEVOTHROID) 100 MCG tablet Take 100 mcg by mouth daily.    Yes Historical Provider, MD  metoprolol tartrate (LOPRESSOR) 25 MG tablet Take 1 tablet (25 mg total) by mouth 2 (two) times daily. 11/12/13  Yes Marykay Lex, MD  Multiple Vitamin (MULTIVITAMIN WITH MINERALS) TABS Take 1 tablet by mouth daily.   Yes Historical Provider, MD  potassium chloride SA (K-DUR,KLOR-CON) 20 MEQ tablet Take 10-20 mEq by mouth daily.    Yes Historical Provider, MD  sertraline (ZOLOFT) 100 MG tablet Take 100 mg by mouth daily.    Yes Historical Provider, MD  simvastatin (ZOCOR) 40 MG tablet Take 1 tablet (40 mg total) by mouth every evening. 09/23/13  Yes Marykay Lex, MD  thiamine (VITAMIN B-1) 100 MG  tablet Take 100 mg by mouth daily.   Yes Historical Provider, MD  vitamin C (ASCORBIC ACID) 500 MG tablet Take 500 mg by mouth daily.   Yes Historical Provider, MD  vitamin E 1000 UNIT capsule Take 1,000 Units by mouth daily.   Yes Historical Provider, MD  warfarin (COUMADIN) 5 MG tablet Take 2.5-5 mg by mouth daily at 6 PM. Takes 2.5 on Mon, Wed, Fri and Sat. Takes 5 mg on all other days 09/09/13  Yes Kristin Alvstad, RPH-CPP   BP 114/99  Pulse 88  Temp(Src) 98.1 F (36.7 C) (Oral)  SpO2 100% Physical Exam  Nursing note and vitals reviewed. Constitutional: She is oriented to person, place, and time.  Chronically ill. Some trouble forming words   HENT:  Head: Normocephalic.  Mouth/Throat: Oropharynx is clear and moist.  Eyes: Conjunctivae and EOM are normal. Pupils are equal, round, and reactive to light.  Neck: Normal range of motion. Neck supple.  Cardiovascular: Normal rate, regular rhythm and normal heart sounds.   Pulmonary/Chest: Effort normal and breath sounds normal. No respiratory distress. She has no wheezes. She has no rales.  Abdominal: Soft. Bowel sounds are normal. She exhibits no distension. There is no tenderness. There is no rebound and no guarding.  Musculoskeletal: Normal range of motion. She exhibits no edema and no tenderness.  Neurological: She is alert and oriented to person, place, and time.  Some trouble getting words out. No obvious facial droop. Nl strength throughout. NIH 1.   Skin: Skin is warm and dry.  Psychiatric: She has a normal mood and affect. Her behavior is normal. Judgment and thought content normal.    ED Course  Procedures (including critical care time) Labs Review Labs Reviewed  PROTIME-INR - Abnormal; Notable for the following:    Prothrombin Time 30.4 (*)    INR 2.91 (*)    All other components within normal limits  APTT - Abnormal; Notable for the following:    aPTT 47 (*)    All other components within normal limits  COMPREHENSIVE  METABOLIC PANEL - Abnormal; Notable for the following:    Glucose, Bld 161 (*)    GFR calc non Af Amer 76 (*)    GFR calc Af Amer 88 (*)    All other components within normal limits  CBG MONITORING, ED - Abnormal; Notable for the following:    Glucose-Capillary 147 (*)    All other components within normal limits  I-STAT CHEM 8, ED - Abnormal; Notable for the following:    Potassium 3.5 (*)  Glucose, Bld 153 (*)    Hemoglobin 15.6 (*)    All other components within normal limits  CBC  DIFFERENTIAL  I-STAT TROPOININ, ED  CBG MONITORING, ED    Imaging Review Ct Angio Head W/cm &/or Wo Cm  01/17/2014   CLINICAL DATA:  80 year old female with slurred speech, last seen normal at 1100 hrs. Code stroke. Initial encounter.  EXAM: CT ANGIOGRAPHY HEAD AND NECK  TECHNIQUE: Multidetector CT imaging of the head and neck was performed using the standard protocol during bolus administration of intravenous contrast. Multiplanar CT image reconstructions and MIPs were obtained to evaluate the vascular anatomy. Carotid stenosis measurements (when applicable) are obtained utilizing NASCET criteria, using the distal internal carotid diameter as the denominator.  CONTRAST:  OMNIPAQUE IOHEXOL 350 MG/ML SOLN  COMPARISON:  Head CT without contrast 1605 hr.  FINDINGS: CTA HEAD FINDINGS  No abnormal enhancement identified. Stable cerebral white matter hypodensity. No evidence of cortically based acute infarction identified. No intracranial mass effect.  VASCULAR FINDINGS:  Major intracranial venous structures are normally enhancing.  Codominant distal vertebral arteries are normal. Normal vertebrobasilar junction. Normal PICA origins. No basilar artery stenosis. Normal SCA origins. PCA origins are within normal limits. Diminutive left posterior communicating artery, the right is more diminutive or absent. The left PCA branches are patent but appear mildly diminutive compared to those on the right (series 12, image  25 versus image 19). However, no PCA stenosis is identified.  Both ICA siphons are patent with minimal calcified plaque, and no ICA siphon stenosis. Ophthalmic and left posterior communicating artery origins are within normal limits. Carotid termini are normal. MCA and ACA origins are within normal limits. Anterior communicating artery and bilateral ACA branches within normal limits. Normal left MCA origin. Left MCA branches are within normal limits. Normal right MCA origin. Subtle decreased attenuation of the right MCA branches, similar to the left PCA findings, but no right MCA stenosis or major branch occlusion identified.  Review of the MIP images confirms the above findings.  CTA NECK FINDINGS  Negative lung apices. Thyromegaly with mild retrosternal extension. Streak artifact at the thoracic inlet related to right shoulder arthroplasty hardware. Negative larynx, pharynx, parapharyngeal spaces, retropharyngeal space, sublingual space, submandibular glands, parotid glands, and orbits soft tissues (postoperative changes to the globes. Visualized paranasal sinuses and mastoids are clear. Degenerative osseous changes in the spine. No acute osseous abnormality identified. No cervical lymphadenopathy.  VASCULAR FINDINGS:  Dolichoectatic distal aortic arch. The great vessel origins are not entirely included on these images. Mild for age calcified plaque in the arch.  The right CCA origin is included and normal. Negative right CCA and right carotid bifurcation. Negative right ICA origin and bulb. Severe tortuosity of the cervical right ICA distal to the bulb, and widespread beaded appearance (see series 9, image 123) in keeping with fibromuscular dysplasia. The right ICA is patent to the skullbase.  No proximal right subclavian artery stenosis. Normal right vertebral artery origin. Negative cervical right vertebral artery.  Left CCA origin not included. Left CCA is patent negative left carotid bifurcation. Negative  left ICA origin and bulb. Similar to the right side, severe tortuosity of the post bulb are cervical left ICA. However, subtle if any beaded appearance on this side. Otherwise negative cervical left ICA.  The left subclavian origin is included. No proximal left subclavian artery stenosis. Mild calcified plaque at the left vertebral artery origin without stenosis. Some obscuration of the proximal left vertebral artery due to the streak  artifact at the thoracic inlet plus dense surrounding paravertebral venous contrast. Otherwise, negative cervical left vertebral artery.  Review of the MIP images confirms the above findings.  IMPRESSION: 1. Negative for age neck CTA except for tortuous bilateral cervical ICAs, and right ICA fibromuscular dysplasia. 2. Essentially negative intracranial CTA; subtle decreased enhancement in the left PCA and right MCA branches may represent normal anatomic variation. No intracranial artery stenosis or occlusion identified. 3. Stable CT appearance of the brain. No changes of acute cortically based infarct are identified.   Electronically Signed   By: Augusto GambleLee  Hall M.D.   On: 01/17/2014 16:56   Ct Head Wo Contrast  01/17/2014   CLINICAL DATA:  Code stroke, slurred speech  EXAM: CT HEAD WITHOUT CONTRAST  TECHNIQUE: Contiguous axial images were obtained from the base of the skull through the vertex without intravenous contrast.  COMPARISON:  03/25/2013  FINDINGS: No evidence of parenchymal hemorrhage or extra-axial fluid collection. No mass lesion, mass effect, or midline shift.  No CT evidence of acute infarction.  Extensive small vessel ischemic changes, including in the subcortical right frontal lobe. Intracranial atherosclerosis.  Global cortical atrophy, likely age appropriate. No ventriculomegaly.  The visualized paranasal sinuses are essentially clear. The mastoid air cells are unopacified.  No evidence of calvarial fracture.  IMPRESSION: No evidence of acute intracranial abnormality.   Atrophy with small vessel ischemic changes and intracranial atherosclerosis.  These results were called by telephone at the time of interpretation on 01/17/2014 at 4:16 pm to Dr. Chaney MallingAVID Niyla Marone , who verbally acknowledged these results.   Electronically Signed   By: Charline BillsSriyesh  Krishnan M.D.   On: 01/17/2014 16:16   Ct Angio Neck W/cm &/or Wo/cm  01/17/2014   CLINICAL DATA:  80 year old female with slurred speech, last seen normal at 1100 hrs. Code stroke. Initial encounter.  EXAM: CT ANGIOGRAPHY HEAD AND NECK  TECHNIQUE: Multidetector CT imaging of the head and neck was performed using the standard protocol during bolus administration of intravenous contrast. Multiplanar CT image reconstructions and MIPs were obtained to evaluate the vascular anatomy. Carotid stenosis measurements (when applicable) are obtained utilizing NASCET criteria, using the distal internal carotid diameter as the denominator.  CONTRAST:  100mL OMNIPAQUE IOHEXOL 350 MG/ML SOLN  COMPARISON:  Head CT without contrast 1605 hr.  FINDINGS: CTA HEAD FINDINGS  No abnormal enhancement identified. Stable cerebral white matter hypodensity. No evidence of cortically based acute infarction identified. No intracranial mass effect.  VASCULAR FINDINGS:  Major intracranial venous structures are normally enhancing.  Codominant distal vertebral arteries are normal. Normal vertebrobasilar junction. Normal PICA origins. No basilar artery stenosis. Normal SCA origins. PCA origins are within normal limits. Diminutive left posterior communicating artery, the right is more diminutive or absent. The left PCA branches are patent but appear mildly diminutive compared to those on the right (series 12, image 25 versus image 19). However, no PCA stenosis is identified.  Both ICA siphons are patent with minimal calcified plaque, and no ICA siphon stenosis. Ophthalmic and left posterior communicating artery origins are within normal limits. Carotid termini are normal. MCA and ACA  origins are within normal limits. Anterior communicating artery and bilateral ACA branches within normal limits. Normal left MCA origin. Left MCA branches are within normal limits. Normal right MCA origin. Subtle decreased attenuation of the right MCA branches, similar to the left PCA findings, but no right MCA stenosis or major branch occlusion identified.  Review of the MIP images confirms the above findings.  CTA NECK FINDINGS  Negative lung apices. Thyromegaly with mild retrosternal extension. Streak artifact at the thoracic inlet related to right shoulder arthroplasty hardware. Negative larynx, pharynx, parapharyngeal spaces, retropharyngeal space, sublingual space, submandibular glands, parotid glands, and orbits soft tissues (postoperative changes to the globes. Visualized paranasal sinuses and mastoids are clear. Degenerative osseous changes in the spine. No acute osseous abnormality identified. No cervical lymphadenopathy.  VASCULAR FINDINGS:  Dolichoectatic distal aortic arch. The great vessel origins are not entirely included on these images. Mild for age calcified plaque in the arch.  The right CCA origin is included and normal. Negative right CCA and right carotid bifurcation. Negative right ICA origin and bulb. Severe tortuosity of the cervical right ICA distal to the bulb, and widespread beaded appearance (see series 9, image 123) in keeping with fibromuscular dysplasia. The right ICA is patent to the skullbase.  No proximal right subclavian artery stenosis. Normal right vertebral artery origin. Negative cervical right vertebral artery.  Left CCA origin not included. Left CCA is patent negative left carotid bifurcation. Negative left ICA origin and bulb. Similar to the right side, severe tortuosity of the post bulb are cervical left ICA. However, subtle if any beaded appearance on this side. Otherwise negative cervical left ICA.  The left subclavian origin is included. No proximal left subclavian  artery stenosis. Mild calcified plaque at the left vertebral artery origin without stenosis. Some obscuration of the proximal left vertebral artery due to the streak artifact at the thoracic inlet plus dense surrounding paravertebral venous contrast. Otherwise, negative cervical left vertebral artery.  Review of the MIP images confirms the above findings.  IMPRESSION: 1. Negative for age neck CTA except for tortuous bilateral cervical ICAs, and right ICA fibromuscular dysplasia. 2. Essentially negative intracranial CTA; subtle decreased enhancement in the left PCA and right MCA branches may represent normal anatomic variation. No intracranial artery stenosis or occlusion identified. 3. Stable CT appearance of the brain. No changes of acute cortically based infarct are identified.   Electronically Signed   By: Augusto Gamble M.D.   On: 01/17/2014 16:56     EKG Interpretation   Date/Time:  Sunday January 17 2014 16:05:30 EDT Ventricular Rate:  97 PR Interval:    QRS Duration: 99 QT Interval:  386 QTC Calculation: 490 R Axis:   -37 Text Interpretation:  Atrial fibrillation Left axis deviation Borderline T  abnormalities, inferior leads Borderline prolonged QT interval No  significant change since last tracing Confirmed by Baker Moronta  MD, Nuha Degner ([id number])  on 01/17/2014 4:18:32 PM      MDM   Final diagnoses:  None   Hayley [name] is a 80 y.o. female here with possible stroke. Last normal was 4 hrs prior to arrival. Resolving symptoms. I did call a code stroke. Neuro saw patient and felt that NIH was 1 and she is on coumadin so doesn't qualify for TPA. CTA showed no lesions to intervene. Recommend inpatient workup for TIA.     Richardean Canal, MD 01/17/14 1728

## 2014-01-17 NOTE — Progress Notes (Signed)
Called and received report at 1916 [facility] Room ready and awaiting arrival of pt to 4N12.

## 2014-01-17 NOTE — Progress Notes (Signed)
ANTICOAGULATION CONSULT NOTE - Initial Consult  Pharmacy Consult for Coumadin Indication: atrial fibrillation  Allergies  Allergen Reactions  . Sulfa Antibiotics Hives  . Ultram [Tramadol] Other (See Comments)    Hallucinations.   Vital Signs: Temp: 97.4 F (36.3 C) (08/09 1739) Temp src: Oral (08/09 1557) BP: 145/83 mmHg (08/09 1830) Pulse Rate: 82 (08/09 1830)  Labs:  Recent Labs  01/17/14 1606 01/17/14 1616  HGB 14.7 15.6*  HCT 43.3 46.0  PLT 170  --   APTT 47*  --   LABPROT 30.4*  --   INR 2.91*  --   CREATININE 0.64 0.70    Medical History: Past Medical History  Diagnosis Date  . Chronic ITP (idiopathic thrombocytopenia)   . Chronic edema     BLE  . Thyroid disease     Hypothyroid  . Hypertension     MD took pt off of meds 04/2012  . Hypothyroidism   . Pelvic fracture October 2014    Recent fall  . Chronic atrial fibrillation     History of RVR June 2012    Assessment: 80 year old female on Coumadin PTA for chronic Afib.  She was admitted tonight after an episode of expressive aphasia.  Head CT shows now acute problem.  Admit INR = 2.91 Dose PTA = 2.5 mg MWFSat, 5 mg other days  Goal of Therapy:  INR 2-3 Monitor platelets by anticoagulation protocol: Yes   Plan:  1) Resume home dose 2) Daily INR  Thank you. Okey RegalLisa Rinoa Garramone, PharmD [phone]  Elwin SleightPowell, Yolando Gillum Kay 01/17/2014,8:31 PM

## 2014-01-17 NOTE — Progress Notes (Signed)
Pt arrived by [facility] [employer](Sean Houles) at 2018. Pt alert and oriented to room, daughter at bedside. Bed alarm on, and call bell in place. Will continue to monitor.

## 2014-01-17 NOTE — ED Notes (Signed)
Attempted to call report

## 2014-01-17 NOTE — ED Notes (Signed)
Family reports pt was normal around 1100 took a nap and woke around 1230 with slurred speech, no other deficits, daughter reports end of sentences are more slurred, pt oriented and denies pain at present.

## 2014-01-18 ENCOUNTER — Inpatient Hospital Stay (HOSPITAL_COMMUNITY): Payer: Medicare Other

## 2014-01-18 ENCOUNTER — Ambulatory Visit (HOSPITAL_COMMUNITY): Payer: Self-pay

## 2014-01-18 LAB — COMPREHENSIVE METABOLIC PANEL
ALK PHOS: 84 U/L (ref 39–117)
ALT: 9 U/L (ref 0–35)
ANION GAP: 11 (ref 5–15)
AST: 16 U/L (ref 0–37)
Albumin: 3.1 g/dL — ABNORMAL LOW (ref 3.5–5.2)
BUN: 7 mg/dL (ref 6–23)
CHLORIDE: 102 meq/L (ref 96–112)
CO2: 30 mEq/L (ref 19–32)
Calcium: 8.7 mg/dL (ref 8.4–10.5)
Creatinine, Ser: 0.57 mg/dL (ref 0.50–1.10)
GFR calc non Af Amer: 79 mL/min — ABNORMAL LOW (ref >90)
GLUCOSE: 104 mg/dL — AB (ref 70–99)
Potassium: 3.7 mEq/L (ref 3.7–5.3)
Sodium: 143 mEq/L (ref 137–147)
TOTAL PROTEIN: 6 g/dL (ref 6.0–8.3)
Total Bilirubin: 0.4 mg/dL (ref 0.3–1.2)

## 2014-01-18 LAB — CBC WITH DIFFERENTIAL/PLATELET
BASOS ABS: 0 10*3/uL (ref 0.0–0.1)
Basophils Relative: 0 % (ref 0–1)
EOS ABS: 0.1 10*3/uL (ref 0.0–0.7)
Eosinophils Relative: 2 % (ref 0–5)
HEMATOCRIT: 37.1 % (ref 36.0–46.0)
HEMOGLOBIN: 12.3 g/dL (ref 12.0–15.0)
Lymphocytes Relative: 31 % (ref 12–46)
Lymphs Abs: 2.3 10*3/uL (ref 0.7–4.0)
MCH: 29 pg (ref 26.0–34.0)
MCHC: 33.2 g/dL (ref 30.0–36.0)
MCV: 87.5 fL (ref 78.0–100.0)
MONO ABS: 0.5 10*3/uL (ref 0.1–1.0)
MONOS PCT: 7 % (ref 3–12)
NEUTROS PCT: 60 % (ref 43–77)
Neutro Abs: 4.3 10*3/uL (ref 1.7–7.7)
Platelets: 143 10*3/uL — ABNORMAL LOW (ref 150–400)
RBC: 4.24 MIL/uL (ref 3.87–5.11)
RDW: 13.6 % (ref 11.5–15.5)
WBC: 7.3 10*3/uL (ref 4.0–10.5)

## 2014-01-18 LAB — PROTIME-INR
INR: 3.26 — ABNORMAL HIGH (ref 0.00–1.49)
Prothrombin Time: 33.2 seconds — ABNORMAL HIGH (ref 11.6–15.2)

## 2014-01-18 LAB — TSH: TSH: 2.78 u[IU]/mL (ref 0.350–4.500)

## 2014-01-18 MED ORDER — ATORVASTATIN CALCIUM 10 MG PO TABS
20.0000 mg | ORAL_TABLET | Freq: Every day | ORAL | Status: DC
  Administered 2014-01-18: 20 mg via ORAL
  Filled 2014-01-18: qty 2

## 2014-01-18 MED ORDER — WARFARIN SODIUM 5 MG PO TABS: 2.5000 mg | ORAL_TABLET | ORAL | Status: DC

## 2014-01-18 NOTE — Progress Notes (Signed)
  Echocardiogram 2D Echocardiogram has been performed.  Arvil ChacoFoster, Geordie Nooney 01/18/2014, 9:38 AM

## 2014-01-18 NOTE — Evaluation (Signed)
Physical Therapy Evaluation Patient Details Name: Hayley [name] M Jentz MRN: [id number] DOB: Jul 01, 1920 Today's Date: 01/18/2014   History of Present Illness  Samson Fredericlla is a pleasant 80 yo female. She lives at home with her daughter and granddaughter. At admission, she had an expressive aphasia, no motor deficits. She is now back to her baseline. CT scan head shows no acute problem.   Clinical Impression  Pt demonstrates a difficulty with retaining instruction, understanding instruction and demonstrating back to PT.  Daughter in to observe and offers concerns about falls.  Pt is worried about her need to understand the assistive devices and when to use what.  PT offered information about options and recommended HHPT if pt going home with family.  Daughter in agreement with continuing therapy after hospital.                     Follow Up Recommendations Home health PT;Supervision/Assistance - 24 hour;Supervision for mobility/OOB    Equipment Recommendations  None recommended by PT    Recommendations for Other Services Other (comment) (HHPT to follow up )     Precautions / Restrictions Precautions Precautions: Fall Precaution Comments: Pt and family were instructed on fall precautions Restrictions Weight Bearing Restrictions: No      Mobility  Bed Mobility Overal bed mobility: Modified Independent Bed Mobility: Sidelying to Sit   Sidelying to sit: Min guard (verbal prompts)          Transfers Overall transfer level: Needs assistance Equipment used: Rolling walker (2 wheeled) Transfers: Sit to/from Stand Sit to Stand: Min assist         General transfer comment: Pt forgets to use hands appropriately for standing and needs reminding about the set up distances to attempt sitting, limited safety awareness and distracted with the task  Ambulation/Gait Ambulation/Gait assistance: Min assist Ambulation Distance (Feet): 10 Feet Assistive device: Rolling walker (2 wheeled) Gait  Pattern/deviations: Wide base of support;Scissoring;Decreased dorsiflexion - right;Decreased dorsiflexion - left;Decreased step length - right;Decreased step length - left;Step-through pattern Gait velocity: decreased Gait velocity interpretation: Below normal speed for age/gender General Gait Details: Has limited endurance and complains she has been tired out from her morning with many visits with staff to perform evaluations and other testing  Stairs            Wheelchair Mobility    Modified Rankin (Stroke Patients Only) Modified Rankin (Stroke Patients Only) Pre-Morbid Rankin Score: Slight disability Modified Rankin: Moderate disability     Balance Overall balance assessment: Needs assistance Sitting-balance support: Feet supported;Bilateral upper extremity supported Sitting balance-Leahy Scale: Fair   Postural control: Posterior lean Standing balance support: Bilateral upper extremity supported Standing balance-Leahy Scale: Poor Standing balance comment: Close supervision with verbal prompts on walker, cued hand placement                             Pertinent Vitals/Pain Pain Assessment: No/denies pain. BP was 108/43, pulse 74 and O2 sat 99% per nsg notes.    Home Living Family/patient expects to be discharged to:: Private residence Living Arrangements: Children Available Help at Discharge: Family;Available 24 hours/day Type of Home: House Home Access: Ramped entrance     Home Layout: One level Home Equipment: Walker - 2 wheels;Tub bench;Toilet riser;Walker - 4 wheels      Prior Function Level of Independence: Independent with assistive device(s)               Hand Dominance  Dominant Hand: Right    Extremity/Trunk Assessment   Upper Extremity Assessment: Generalized weakness           Lower Extremity Assessment: Generalized weakness      Cervical / Trunk Assessment: Kyphotic  Communication   Communication: No  difficulties;Expressive difficulties  Cognition Arousal/Alertness: Awake/alert Behavior During Therapy: WFL for tasks assessed/performed Overall Cognitive Status: Difficult to assess (aphasia; family does not elaborate)       Memory: Decreased short-term memory              General Comments General comments (skin integrity, edema, etc.): Daughters in constant attendance for pt, asking questions and planning to take her home.  Pt has history of many falls and daughters are concerned about lots of follow up therapy to address this.     Exercises        Assessment/Plan    PT Assessment Patient needs continued PT services  PT Diagnosis Abnormality of gait   PT Problem List Decreased strength;Decreased range of motion;Decreased activity tolerance;Decreased balance;Decreased mobility;Decreased coordination;Decreased cognition;Decreased knowledge of use of DME;Decreased safety awareness;Decreased knowledge of precautions;Obesity  PT Treatment Interventions DME instruction;Gait training;Stair training;Functional mobility training;Therapeutic activities;Therapeutic exercise;Balance training;Neuromuscular re-education;Patient/family education   PT Goals (Current goals can be found in the Care Plan section) Acute Rehab PT Goals Patient Stated Goal: To decide what equipment is needed to be safe PT Goal Formulation: With patient/family Time For Goal Achievement: 01/25/14 Potential to Achieve Goals: Good    Frequency Min 4X/week   Barriers to discharge Inaccessible home environment Pt tolerating very short trip on walker and planning to go home with daughters who will need pt to be able to ascend ramp    Co-evaluation               End of Session Equipment Utilized During Treatment: Gait belt Activity Tolerance: Patient limited by fatigue Patient left: in chair;with call bell/phone within reach;with family/visitor present Nurse Communication: Mobility status         Time:  1151-1222 PT Time Calculation (min): 31 min   Charges:   PT Evaluation $Initial PT Evaluation Tier I: 1 Procedure PT Treatments $Gait Training: 8-22 mins   PT G CodesIvar Drape 01/28/2014, 1:04 PM  Samul Dada, PT MS Acute Rehab Dept. Number: [phone]

## 2014-01-18 NOTE — Progress Notes (Signed)
Patient apprehensive about having eeg done, Rn notified and attempted to locate patient daughter. Will attempt tomorrow when family available.

## 2014-01-18 NOTE — Plan of Care (Signed)
Problem: Acute Rehab PT Goals(only PT should resolve) Goal: Patient Will Transfer Sit To/From Stand From all surface heights Goal: Pt Will Transfer Bed To Chair/Chair To Bed With correct hand placement Goal: Pt Will Ambulate On level surfaces Goal: Pt Will Go Up/Down Stairs Without LOB

## 2014-01-18 NOTE — Progress Notes (Addendum)
ANTICOAGULATION CONSULT NOTE - Initial Consult  Pharmacy Consult for Coumadin Indication: atrial fibrillation  Allergies  Allergen Reactions  . Sulfa Antibiotics Hives  . Ultram [Tramadol] Other (See Comments)    Hallucinations.   Vital Signs: Temp: 97.3 F (36.3 C) (08/10 1015) Temp src: Oral (08/10 1015) BP: 108/43 mmHg (08/10 1015) Pulse Rate: 74 (08/10 1015)  Labs:  Recent Labs  01/17/14 1606 01/17/14 1616 01/18/14 0441  HGB 14.7 15.6* 12.3  HCT 43.3 46.0 37.1  PLT 170  --  143*  APTT 47*  --   --   LABPROT 30.4*  --  33.2*  INR 2.91*  --  3.26*  CREATININE 0.64 0.70 0.57    Medical History: Past Medical History  Diagnosis Date  . Chronic ITP (idiopathic thrombocytopenia)   . Chronic edema     BLE  . Thyroid disease     Hypothyroid  . Hypertension     MD took pt off of meds 04/2012  . Hypothyroidism   . Pelvic fracture October 2014    Recent fall  . Chronic atrial fibrillation     History of RVR June 2012    Assessment: 80 year old female on Coumadin PTA for chronic Afib.  She was admitted after an episode of expressive aphasia.  Head CT shows now acute problem.  Admit INR = 2.91 > 3.26 Dose PTA = 2.5 mg MWFSat, 5 mg other days  Goal of Therapy:  INR 2-3 Monitor platelets by anticoagulation protocol: Yes   Plan:  1) Hold today's dose, resume home dose from tomorrow unless INR continue trending up. 2) Daily INR  Thank you.  Bayard HuggerMei Bell, PharmD, BCPS  Clinical Pharmacist  Pager: [phone]  01/18/2014,10:46 AM  Addendum: MRI positive for multiple micro hemorrhages, will change coumadin to Eliquis 2.5 BID when INR < 2.  Plan: Will continue monitor daily INR Start Eliquis when INR < 2

## 2014-01-18 NOTE — Progress Notes (Signed)
MRI/CUS/TTE today in eval of TIA . A1C, lipids pending.  Pt back to baseline .  If w/u negative today, will consider discharge today.  Likely to remain only on coumadin at discharge given 6 major falls in about 2 years time, risk of ASA outways benefit.   Formal note to follow   Alysia PennaHOLWERDA, Ivey Nembhard, MD 01/18/2014 8:04 AM

## 2014-01-18 NOTE — Progress Notes (Signed)
Physician Daily Progress Note  Subjective: No events overnight  Objective: Vital signs in last 24 hours:   Filed Vitals:   01/18/14 0400 01/18/14 0500 01/18/14 0600 01/18/14 1015  BP: 120/74  133/76 108/43  Pulse: 70  70 74  Temp: 98.1 F (36.7 C)  98.2 F (36.8 C) 97.3 F (36.3 C)  TempSrc: Oral  Oral Oral  Resp: 20  20 20   Height:      Weight:  152 lb 14.4 oz (69.355 kg)    SpO2: 99%  98% 99%   Weight change:  Last BM Date: 01/16/14  CBG (last 3)   Recent Labs  01/17/14 1609  GLUCAP 147*    Intake/Output from previous day:  Intake/Output Summary (Last 24 hours) at 01/18/14 1245 Last data filed at 01/17/14 2248  Gross per 24 hour  Intake      0 ml  Output    300 ml  Net   -300 ml      Physical Exam General appearance:WF in NAD  HEENT: MMM, EOMI  Resp:  CTAB, no wheezes, rales  Cardio:  Ir/ir, reg rate, no MRG  GI: soft, non-tender; bowel sounds normal; no masses,  no organomegaly Extremities: no clubbing, cyanosis or edema   Labs: Basic Metabolic Panel:  Recent Labs Lab 01/17/14 1606 01/17/14 1616 01/18/14 0441  NA 137 138 143  K 3.7 3.5* 3.7  CL 96 99 102  CO2 26  --  30  GLUCOSE 161* 153* 104*  BUN 11 9 7   CREATININE 0.64 0.70 0.57  CALCIUM 9.8  --  8.7   GFR Estimated Creatinine Clearance: 43.8 ml/min (by C-G formula based on Cr of 0.57). Liver Function Tests:  Recent Labs Lab 01/17/14 1606 01/18/14 0441  AST 21 16  ALT 11 9  ALKPHOS 105 84  BILITOT 0.3 0.4  PROT 7.5 6.0  ALBUMIN 3.8 3.1*   No results found for this basename: LIPASE, AMYLASE,  in the last 168 hours No results found for this basename: AMMONIA,  in the last 168 hours Coagulation profile  Recent Labs Lab 01/11/14 1540 01/17/14 1606 01/18/14 0441  INR 3.4 2.91* 3.26*    CBC:  Recent Labs Lab 01/17/14 1606 01/17/14 1616 01/18/14 0441  WBC 10.3  --  7.3  NEUTROABS 6.7  --  4.3  HGB 14.7 15.6* 12.3  HCT 43.3 46.0 37.1  MCV 87.5  --  87.5  PLT  170  --  143*   Cardiac Enzymes: No results found for this basename: CKTOTAL, CKMB, CKMBINDEX, TROPONINI,  in the last 168 hours BNP (last 3 results) No results found for this basename: PROBNP,  in the last 8760 hours CBG:  Recent Labs Lab 01/17/14 1609  GLUCAP 147*   D-Dimer: No results found for this basename: DDIMER,  in the last 72 hours Hgb A1c: No results found for this basename: HGBA1C,  in the last 72 hours Lipid Profile: No results found for this basename: CHOL, HDL, LDLCALC, TRIG, CHOLHDL, LDLDIRECT,  in the last 72 hours Thyroid function studies:  Recent Labs  01/18/14 0441  TSH 2.780   Anemia work up: No results found for this basename: VITAMINB12, FOLATE, FERRITIN, TIBC, IRON, RETICCTPCT,  in the last 72 hours Sepsis Labs:  Recent Labs Lab 01/17/14 1606 01/18/14 0441  WBC 10.3 7.3   Microbiology No results found for this or any previous visit (from the past 240 hour(s)).   . diltiazem  120 mg Oral BID  . docusate sodium  100 mg Oral BID  . furosemide  20 mg Oral BID  . levothyroxine  100 mcg Oral QAC breakfast  . metoprolol tartrate  25 mg Oral BID  . potassium chloride SA  10 mEq Oral Daily  . sertraline  100 mg Oral Daily  . simvastatin  40 mg Oral QPM  . sodium chloride  3 mL Intravenous Q12H  . [START ON 01/20/2014] warfarin  2.5 mg Oral Once per day on Mon Wed Fri Sat  . warfarin  5 mg Oral Once per day on Sun Tue Thu  . Warfarin - Pharmacist Dosing Inpatient   Does not apply q1800   Continuous Infusions:    Assessment/Plan:  TIA - MRI showed NAICA. TTE/CUS pending. A1C/TSH/lipids ordered for risk assessment. PT ordered for eval. Neuro consulted. On coumadin w/ INR > 3 today.   Cerebral microhemorrhages - change coumadin to eliquis 2.5mg  BID per neuro recs given improved bleeding profile vs coumadin as well as her advanced age. Checking EEG to ensure no seizure like activity  afib - home meds except changing coumadin to eliquis as stated  above . Will wait for INR to be < 2 prior to first dose of eliquis . Care management consulted to ensure pt able to afford medication .   Dispo - home after completion analysis of EEG evaluation     LOS: 1 day   Hayley [name] 01/18/2014, 12:45 PM

## 2014-01-18 NOTE — Care Management Note (Addendum)
    Page 1 of 2   01/20/2014     9:02:49 AM CARE MANAGEMENT NOTE 01/20/2014  Patient:  Hayley [name],Hayley [name]   Account Number:  [id number]  Date Initiated:  01/18/2014  Documentation initiated by:  Elmer BalesOBARGE,COURTNEY  Subjective/Objective Assessment:   Patient was admitted with slurred speech. Lives at home with adult children.     Action/Plan:   Will follow for discharge needs pending PT/OT evals and physician orders.   Anticipated DC Date:     Anticipated DC Plan:  HOME W HOME HEALTH SERVICES      DC Planning Services  CM consult  Medication Assistance      Choice offered to / List presented to:  C-4 Adult Children      DME agency  [facility]     [facility]  HH[facility]2 [facility]      Status of service:  Completed, signed off Medicare Important Message given?  NA - LOS <3 / Initial given by admissions (If response is "NO", the following Medicare IM given date fields will be blank) Date Medicare IM given:   Medicare IM given by:   Date Additional Medicare IM given:   Additional Medicare IM given by:    Discharge Disposition:    Per UR Regulation:    If discussed at Long Length of Stay Meetings, dates discussed:    Comments:  01/18/14 1515 Elmer Balesourtney Robarge RN, MSN, CM- Spoke with Dr Link SnufferHolwerda regarding copay for Eliquis.  Patient and daughter state that they will be able to afford the $189/30 day supply.  Dr Link SnufferHolwerda will write for 5mg  tablets 0.5 tab PO BID at discharge based on cost.  Patient was provided with a 30 day free trial card. Patient and daughter have chosen [facility] for HHPT, which they have used in the past. Stephanice with [facility] was notified and has accepted the referral.    01/18/14 1445 Elmer Balesourtney Robarge RN, MSN, CM- Benefits check for Eliquis 2.5mg  PO BID completed. Patient's copay is $377.89/ 30 days.  If written as 5mg  PO (0.5 tab BID), copay is $189.  Patient is not able to use a copay assistance card, as she has [employer] insurance.  Dr Link SnufferHolwerda was paged  regarding the copay. Awaiting return call.

## 2014-01-18 NOTE — Progress Notes (Addendum)
Stroke Team Progress Note  HISTORY Hayley [name] is a pleasant 80 yo female. She lives at home with her daughter and granddaughter. She was in her NSOH until she woke from a nap around noon today. She had an expressive aphasia. No motor deficits noted but her words were just jibberish. This lasted in all About 3 hours. She is now back to her baseline. CT scan head shows no acute problem  Patient was not administered TPA secondary to anticoagulation with warfarin and resolution of symptoms. She was admitted to the neuro floor bed for further evaluation and treatment.  SUBJECTIVE Her daughter is at the bedside.  Overall she feels her condition is unchanged. Patient states that she had a similar episode of transient aphasia ain June 2012 at that time she was found to have atrial fibrillation and she has been on warfarin since then. She used to have multiple falls but in the last 6 months she's had only one fall. She denies any known history of brain hemorrhage or strokes but MRI scan this morning personally reviewed by me shows multiple tiny microhemorrhages over the left parietal and occipital regions as well as a remote area of hemorrhage in the left parietal. No acute infarct is noted.  OBJECTIVE Most recent Vital Signs: Filed Vitals:   01/18/14 0400 01/18/14 0500 01/18/14 0600 01/18/14 1015  BP: 120/74  133/76 108/43  Pulse: 70  70 74  Temp: 98.1 F (36.7 C)  98.2 F (36.8 C) 97.3 F (36.3 C)  TempSrc: Oral  Oral Oral  Resp: 20  20 20   Height:      Weight:  69.355 kg (152 lb 14.4 oz)    SpO2: 99%  98% 99%   CBG (last 3)   Recent Labs  01/17/14 1609  GLUCAP 147*    IV Fluid Intake:     MEDICATIONS  . diltiazem  120 mg Oral BID  . docusate sodium  100 mg Oral BID  . furosemide  20 mg Oral BID  . levothyroxine  100 mcg Oral QAC breakfast  . metoprolol tartrate  25 mg Oral BID  . potassium chloride SA  10 mEq Oral Daily  . sertraline  100 mg Oral Daily  . simvastatin  40 mg  Oral QPM  . sodium chloride  3 mL Intravenous Q12H  . [START ON 01/20/2014] warfarin  2.5 mg Oral Once per day on Mon Wed Fri Sat  . warfarin  5 mg Oral Once per day on Sun Tue Thu  . Warfarin - Pharmacist Dosing Inpatient   Does not apply q1800   PRN:  acetaminophen, acetaminophen, ALPRAZolam, ondansetron (ZOFRAN) IV, ondansetron  Diet:  Cardiac   Activity:  Bedrest    DVT Prophylaxis:  warfarin  CLINICALLY SIGNIFICANT STUDIES Basic Metabolic Panel:  Recent Labs Lab 01/17/14 1606 01/17/14 1616 01/18/14 0441  NA 137 138 143  K 3.7 3.5* 3.7  CL 96 99 102  CO2 26  --  30  GLUCOSE 161* 153* 104*  BUN 11 9 7   CREATININE 0.64 0.70 0.57  CALCIUM 9.8  --  8.7   Liver Function Tests:  Recent Labs Lab 01/17/14 1606 01/18/14 0441  AST 21 16  ALT 11 9  ALKPHOS 105 84  BILITOT 0.3 0.4  PROT 7.5 6.0  ALBUMIN 3.8 3.1*   CBC:  Recent Labs Lab 01/17/14 1606 01/17/14 1616 01/18/14 0441  WBC 10.3  --  7.3  NEUTROABS 6.7  --  4.3  HGB 14.7 15.6* 12.3  HCT 43.3 46.0 37.1  MCV 87.5  --  87.5  PLT 170  --  143*   Coagulation:  Recent Labs Lab 01/11/14 1540 01/17/14 1606 01/18/14 0441  LABPROT  --  30.4* 33.2*  INR 3.4 2.91* 3.26*   Cardiac Enzymes: No results found for this basename: CKTOTAL, CKMB, CKMBINDEX, TROPONINI,  in the last 168 hours Urinalysis: No results found for this basename: COLORURINE, APPERANCEUR, LABSPEC, PHURINE, GLUCOSEU, HGBUR, BILIRUBINUR, KETONESUR, PROTEINUR, UROBILINOGEN, NITRITE, LEUKOCYTESUR,  in the last 168 hours Lipid Panel No results found for this basename: chol, trig, hdl, cholhdl, vldl, ldlcalc   HgbA1C  Lab Results  Component Value Date   HGBA1C 6.8* 12/08/2010    Urine Drug Screen:   No results found for this basename: labopia, cocainscrnur, labbenz, amphetmu, thcu, labbarb    Alcohol Level: No results found for this basename: ETH,  in the last 168 hours  Ct Angio Head W/cm &/or Wo Cm  01/17/2014   CLINICAL DATA:  80 year old  female with slurred speech, last seen normal at 1100 hrs. Code stroke. Initial encounter.  EXAM: CT ANGIOGRAPHY HEAD AND NECK  TECHNIQUE: Multidetector CT imaging of the head and neck was performed using the standard protocol during bolus administration of intravenous contrast. Multiplanar CT image reconstructions and MIPs were obtained to evaluate the vascular anatomy. Carotid stenosis measurements (when applicable) are obtained utilizing NASCET criteria, using the distal internal carotid diameter as the denominator.  CONTRAST:  OMNIPAQUE IOHEXOL 350 MG/ML SOLN  COMPARISON:  Head CT without contrast 1605 hr.  FINDINGS: CTA HEAD FINDINGS  No abnormal enhancement identified. Stable cerebral white matter hypodensity. No evidence of cortically based acute infarction identified. No intracranial mass effect.  VASCULAR FINDINGS:  Major intracranial venous structures are normally enhancing.  Codominant distal vertebral arteries are normal. Normal vertebrobasilar junction. Normal PICA origins. No basilar artery stenosis. Normal SCA origins. PCA origins are within normal limits. Diminutive left posterior communicating artery, the right is more diminutive or absent. The left PCA branches are patent but appear mildly diminutive compared to those on the right (series 12, image 25 versus image 19). However, no PCA stenosis is identified.  Both ICA siphons are patent with minimal calcified plaque, and no ICA siphon stenosis. Ophthalmic and left posterior communicating artery origins are within normal limits. Carotid termini are normal. MCA and ACA origins are within normal limits. Anterior communicating artery and bilateral ACA branches within normal limits. Normal left MCA origin. Left MCA branches are within normal limits. Normal right MCA origin. Subtle decreased attenuation of the right MCA branches, similar to the left PCA findings, but no right MCA stenosis or major branch occlusion identified.  Review of the MIP  images confirms the above findings.  CTA NECK FINDINGS  Negative lung apices. Thyromegaly with mild retrosternal extension. Streak artifact at the thoracic inlet related to right shoulder arthroplasty hardware. Negative larynx, pharynx, parapharyngeal spaces, retropharyngeal space, sublingual space, submandibular glands, parotid glands, and orbits soft tissues (postoperative changes to the globes. Visualized paranasal sinuses and mastoids are clear. Degenerative osseous changes in the spine. No acute osseous abnormality identified. No cervical lymphadenopathy.  VASCULAR FINDINGS:  Dolichoectatic distal aortic arch. The great vessel origins are not entirely included on these images. Mild for age calcified plaque in the arch.  The right CCA origin is included and normal. Negative right CCA and right carotid bifurcation. Negative right ICA origin and bulb. Severe tortuosity of the cervical right ICA distal to the bulb, and widespread beaded appearance (  see series 9, image 123) in keeping with fibromuscular dysplasia. The right ICA is patent to the skullbase.  No proximal right subclavian artery stenosis. Normal right vertebral artery origin. Negative cervical right vertebral artery.  Left CCA origin not included. Left CCA is patent negative left carotid bifurcation. Negative left ICA origin and bulb. Similar to the right side, severe tortuosity of the post bulb are cervical left ICA. However, subtle if any beaded appearance on this side. Otherwise negative cervical left ICA.  The left subclavian origin is included. No proximal left subclavian artery stenosis. Mild calcified plaque at the left vertebral artery origin without stenosis. Some obscuration of the proximal left vertebral artery due to the streak artifact at the thoracic inlet plus dense surrounding paravertebral venous contrast. Otherwise, negative cervical left vertebral artery.  Review of the MIP images confirms the above findings.  IMPRESSION: 1. Negative  for age neck CTA except for tortuous bilateral cervical ICAs, and right ICA fibromuscular dysplasia. 2. Essentially negative intracranial CTA; subtle decreased enhancement in the left PCA and right MCA branches may represent normal anatomic variation. No intracranial artery stenosis or occlusion identified. 3. Stable CT appearance of the brain. No changes of acute cortically based infarct are identified.   Electronically Signed   By: Augusto Gamble M.D.   On: 01/17/2014 16:56   Ct Head Wo Contrast  01/17/2014   CLINICAL DATA:  Code stroke, slurred speech  EXAM: CT HEAD WITHOUT CONTRAST  TECHNIQUE: Contiguous axial images were obtained from the base of the skull through the vertex without intravenous contrast.  COMPARISON:  03/25/2013  FINDINGS: No evidence of parenchymal hemorrhage or extra-axial fluid collection. No mass lesion, mass effect, or midline shift.  No CT evidence of acute infarction.  Extensive small vessel ischemic changes, including in the subcortical right frontal lobe. Intracranial atherosclerosis.  Global cortical atrophy, likely age appropriate. No ventriculomegaly.  The visualized paranasal sinuses are essentially clear. The mastoid air cells are unopacified.  No evidence of calvarial fracture.  IMPRESSION: No evidence of acute intracranial abnormality.  Atrophy with small vessel ischemic changes and intracranial atherosclerosis.  These results were called by telephone at the time of interpretation on 01/17/2014 at 4:16 pm to Dr. Chaney Malling , who verbally acknowledged these results.   Electronically Signed   By: Charline Bills M.D.   On: 01/17/2014 16:16   Ct Angio Neck W/cm &/or Wo/cm  01/17/2014   CLINICAL DATA:  80 year old female with slurred speech, last seen normal at 1100 hrs. Code stroke. Initial encounter.  EXAM: CT ANGIOGRAPHY HEAD AND NECK  TECHNIQUE: Multidetector CT imaging of the head and neck was performed using the standard protocol during bolus administration of intravenous  contrast. Multiplanar CT image reconstructions and MIPs were obtained to evaluate the vascular anatomy. Carotid stenosis measurements (when applicable) are obtained utilizing NASCET criteria, using the distal internal carotid diameter as the denominator.  CONTRAST:  OMNIPAQUE IOHEXOL 350 MG/ML SOLN  COMPARISON:  Head CT without contrast 1605 hr.  FINDINGS: CTA HEAD FINDINGS  No abnormal enhancement identified. Stable cerebral white matter hypodensity. No evidence of cortically based acute infarction identified. No intracranial mass effect.  VASCULAR FINDINGS:  Major intracranial venous structures are normally enhancing.  Codominant distal vertebral arteries are normal. Normal vertebrobasilar junction. Normal PICA origins. No basilar artery stenosis. Normal SCA origins. PCA origins are within normal limits. Diminutive left posterior communicating artery, the right is more diminutive or absent. The left PCA branches are patent but appear mildly diminutive  compared to those on the right (series 12, image 25 versus image 19). However, no PCA stenosis is identified.  Both ICA siphons are patent with minimal calcified plaque, and no ICA siphon stenosis. Ophthalmic and left posterior communicating artery origins are within normal limits. Carotid termini are normal. MCA and ACA origins are within normal limits. Anterior communicating artery and bilateral ACA branches within normal limits. Normal left MCA origin. Left MCA branches are within normal limits. Normal right MCA origin. Subtle decreased attenuation of the right MCA branches, similar to the left PCA findings, but no right MCA stenosis or major branch occlusion identified.  Review of the MIP images confirms the above findings.  CTA NECK FINDINGS  Negative lung apices. Thyromegaly with mild retrosternal extension. Streak artifact at the thoracic inlet related to right shoulder arthroplasty hardware. Negative larynx, pharynx, parapharyngeal spaces,  retropharyngeal space, sublingual space, submandibular glands, parotid glands, and orbits soft tissues (postoperative changes to the globes. Visualized paranasal sinuses and mastoids are clear. Degenerative osseous changes in the spine. No acute osseous abnormality identified. No cervical lymphadenopathy.  VASCULAR FINDINGS:  Dolichoectatic distal aortic arch. The great vessel origins are not entirely included on these images. Mild for age calcified plaque in the arch.  The right CCA origin is included and normal. Negative right CCA and right carotid bifurcation. Negative right ICA origin and bulb. Severe tortuosity of the cervical right ICA distal to the bulb, and widespread beaded appearance (see series 9, image 123) in keeping with fibromuscular dysplasia. The right ICA is patent to the skullbase.  No proximal right subclavian artery stenosis. Normal right vertebral artery origin. Negative cervical right vertebral artery.  Left CCA origin not included. Left CCA is patent negative left carotid bifurcation. Negative left ICA origin and bulb. Similar to the right side, severe tortuosity of the post bulb are cervical left ICA. However, subtle if any beaded appearance on this side. Otherwise negative cervical left ICA.  The left subclavian origin is included. No proximal left subclavian artery stenosis. Mild calcified plaque at the left vertebral artery origin without stenosis. Some obscuration of the proximal left vertebral artery due to the streak artifact at the thoracic inlet plus dense surrounding paravertebral venous contrast. Otherwise, negative cervical left vertebral artery.  Review of the MIP images confirms the above findings.  IMPRESSION: 1. Negative for age neck CTA except for tortuous bilateral cervical ICAs, and right ICA fibromuscular dysplasia. 2. Essentially negative intracranial CTA; subtle decreased enhancement in the left PCA and right MCA branches may represent normal anatomic variation. No  intracranial artery stenosis or occlusion identified. 3. Stable CT appearance of the brain. No changes of acute cortically based infarct are identified.   Electronically Signed   By: Augusto GambleLee  Hall M.D.   On: 01/17/2014 16:56   Mr Brain Wo Contrast  01/18/2014   CLINICAL DATA:  80 year old female with abnormal speech episode lasting about 3 hrs. Initial encounter.  EXAM: MRI HEAD WITHOUT CONTRAST  TECHNIQUE: Multiplanar, multiecho pulse sequences of the brain and surrounding structures were obtained without intravenous contrast.  COMPARISON:  Head and neck CTA 01/17/2014, and earlier.  FINDINGS: Cerebral volume is within normal limits for age. Susceptibility artifact on diffusion imaging in the left parietal lobe related to chronic blood products, see below. No restricted diffusion to suggest acute infarction. No midline shift, mass effect, evidence of mass lesion, ventriculomegaly, extra-axial collection or acute intracranial hemorrhage. Cervicomedullary junction and pituitary are within normal limits. Negative visualized cervical spine. Major intracranial vascular flow  voids are preserved.  Chronic hemorrhage in the left parietal lobe with mildly heterogeneous T2 signal (series 8, image 17 and series 9, image 17). No associated edema or mass effect. Suggestion of mild associated cortical encephalomalacia. Multiple other scattered foci of chronic micro hemorrhage in the left hemisphere, mostly the left PCA territory. Occasional small foci of chronic micro hemorrhage also in the right PCA territory. Superimposed Patchy and confluent cerebral white matter T2 and FLAIR hyperintensity in both hemispheres. No other cortical encephalomalacia. Deep gray matter nuclei, brainstem and cerebellum are within normal limits for age.  Mild right mastoid effusion. Visible internal auditory structures appear normal. Trace left mastoid fluid. Paranasal sinuses are clear. Postoperative changes to the globes. Visualized scalp soft  tissues are within normal limits. Normal bone marrow signal.  IMPRESSION: 1.  No acute intracranial abnormality. 2. Focus of chronic hemorrhage in the left parietal lobe, nonspecific and could for example be posttraumatic. There are multiple additional small chronic micro hemorrhages in the hemispheres, more numerous on the left, but the appearance does not rise to the level typically seen with amyloid angiopathy. No strong evidence of vascular malformation. Sequelae of chronic ischemia felt less likely. 3. Otherwise nonspecific white matter signal changes.   Electronically Signed   By: Augusto Gamble M.D.   On: 01/18/2014 08:44       Carotid Doppler  See CTA  2D Echocardiogram   pending  CXR  pending  EKG   Atrial fibrillation Left axis deviation Borderline T abnormalities, inferior leads Borderline prolonged QT interval Therapy Recommendations  pending  Physical Exam  elderly Caucasian lady extremely hard of hearing.Awake alert. Afebrile. Head is nontraumatic. Neck is supple without bruit. Hearing is poor bilaterally Cardiac exam no murmur or gallop. Lungs are clear to auscultation. Distal pulses are well felt. Neurological Exam ;  Awake  Alert oriented x 3. Normal speech and language.eye movements full without nystagmus.fundi were not visualized. Vision acuity and fields appear normal. Hearing is normal. Palatal movements are normal. Face symmetric. Tongue midline. Normal strength, tone, reflexes and coordination. Normal sensation. Gait deferred. ASSESSMENT Hayley [name] is a 80 y.o. female presenting with transient episode of expressive aphasia lasting 3 hours likely left hemispheric TIA of cardioembolic etiology from atrial fibrillation despite optimal anticoagulation with warfarin with INR of 3. Brain MRI scan shows no acute infarct but shows several areas of tiny microhemorrhages including alongterm remote age left parietal hemorrhage and the patient having multiple falls makes her at  high  longterm risk for bleeding complications with warfarin. Patient may benefit by switching to eliquis because of relative decrease risk of foreign hemorrhage On warfarin prior to admission. Now on no anticoagulants for secondary stroke prevention. Patient with resultant  No deficits.     Atrial Fibrillation  LDL pending  HbA1c 6.8-no h/o diabetes   Hospital day # 1  TREATMENT/PLAN I had a long discussion with the patient, daughter as well as with Dr Link Snuffer about her long-term bleeding risk if she stays on warfarin and considering switching to eliquis which has been shown to have slightly lower risk for intracranial bleeding. She remains at significant risk for recurrent strokes and TIAs given atrial fibrillation Recommend hold warfarin for now and when INR is below 2 start eliquis 2.5 mg twice daily. Check 2-D echocardiogram, lipid profile. Mobilize out of bed. PT OT consults.  Check EEG for seizures Followup as outpatient in stroke clinic with Dr. Roda Shutters in 4-6 weeks   Delia Heady, MD  SIGNED    To contact Stroke Continuity provider, please refer to [email or web address]. After hours, contact General Neurology

## 2014-01-18 NOTE — Evaluation (Signed)
Speech Language Pathology Evaluation Patient Details Name: Hayley [name] Diedrich MRN: [id number] DOB: 03-17-1921 Today's Date: 01/18/2014 Time: [id number] SLP Time Calculation (min): 30 min  Problem List:  Patient Active Problem List   Diagnosis Date Noted  . TIA (transient ischemic attack) 01/17/2014  . Poor balance 04/24/2013    Class: Chronic  . Hypotension due to drugs 04/24/2013  . Bilateral lower extremity edema 04/24/2013    Class: Chronic  . Chronic atrial fibrillation   . Long term (current) use of anticoagulants 08/27/2012  . Closed fracture of right proximal humerus 06/22/2012  . Essential hypertension 06/20/2012  . Hypothyroidism 06/20/2012  . ITP (idiopathic thrombocytopenic purpura) 04/16/2011   Past Medical History:  Past Medical History  Diagnosis Date  . Chronic ITP (idiopathic thrombocytopenia)   . Chronic edema     BLE  . Thyroid disease     Hypothyroid  . Hypertension     MD took pt off of meds 04/2012  . Hypothyroidism   . Pelvic fracture October 2014    Recent fall  . Chronic atrial fibrillation     History of RVR June 2012   Past Surgical History:  Past Surgical History  Procedure Laterality Date  . Shoulder hemi-arthroplasty  06/23/2012    Procedure: SHOULDER HEMI-ARTHROPLASTY;  Surgeon: Harvie JuniorJohn L Graves, MD;  Location: [facility] OR;  Service: Orthopedics;  Laterality: Right;  . Abdominal hysterectomy      1998  . Cesarean section      1969  . Bunionectomy      2000  . Orif elbow fracture  07/16/2012    Procedure: OPEN REDUCTION INTERNAL FIXATION (ORIF) ELBOW/OLECRANON FRACTURE;  Surgeon: Harvie JuniorJohn L Graves, MD;  Location: [facility] OR;  Service: Orthopedics;  Laterality: Right;  . Doppler echocardiography  12/2010     mild coccentric LVH but normal diastolic function of age. mild to moderate mtral regurg. moderately dilated right atrium and left atrium.   . Doppler echocardiography    . Event monitor  12/16/2010-01/10/2011    afib c-occ rvr   HPI:  Hayley [name] Beaudoin  is a 80 y.o. female with a history of atrial fibrillation and multiple falls, who presents with aphasia that began after a nap 01/18/14.    Assessment / Plan / Recommendation Clinical Impression  Pt presents with an expressive > receptive mixed nonfluent aphasia, characterized by phonemic and semantic paraphasias as well as word-finding difficulty within conversational speech, impacting her ability to functionally communicate. Pt also demonstrated decreased sustained attention, working memory, and storage of new information, requiring Max multimodal cueing from SLP to participate in basic functional tasks. Pt will benefit from skilled SLP services to maximize functional communication and cognition to facilitate d/c home with 24/7 supervision and f/u OP SLP services recommended.    SLP Assessment  Patient needs continued Speech Lanaguage Pathology Services    Follow Up Recommendations  Outpatient SLP;24 hour supervision/assistance    Frequency and Duration min 2x/week  1 week   Pertinent Vitals/Pain Pain Assessment: No/denies pain   SLP Goals  SLP Goals Potential to Achieve Goals: Good Potential Considerations: Ability to learn/carryover information  SLP Evaluation Prior Functioning  Cognitive/Linguistic Baseline: Information not available (pt has difficulty reporting history) Type of Home: House  Lives With: Family;Daughter;Other (Comment) (daughter, granddaughter per chart) Available Help at Discharge: Family;Available 24 hours/day Vocation: Retired   IT consultantCognition  Overall Cognitive Status: Difficult to assess (aphasia; family does not elaborate) Arousal/Alertness: Awake/alert Orientation Level: Oriented to person;Oriented to place;Oriented to situation;Disoriented to time Attention:  Sustained Sustained Attention: Impaired Sustained Attention Impairment: Verbal basic;Functional basic Memory: Impaired Memory Impairment: Storage deficit;Decreased recall of new information Awareness:  Impaired Awareness Impairment: Anticipatory impairment Problem Solving: Impaired Problem Solving Impairment: Verbal basic Safety/Judgment: Impaired    Comprehension  Auditory Comprehension Overall Auditory Comprehension: Impaired Yes/No Questions: Impaired Basic Biographical Questions: 76-100% accurate Basic Immediate Environment Questions: 75-100% accurate Complex Questions: 75-100% accurate Paragraph Comprehension (via yes/no questions): 51-75% accurate Commands: Impaired One Step Basic Commands: 75-100% accurate Two Step Basic Commands: 75-100% accurate Multistep Basic Commands: 50-74% accurate Conversation: Simple Interfering Components: Attention;Hearing;Working memory EffectiveTechniques: Increased volume;Slowed speech;Visual/Gestural cues Visual Recognition/Discrimination Discrimination: Not tested Reading Comprehension Reading Status: Not tested    Expression Expression Primary Mode of Expression: Verbal Verbal Expression Overall Verbal Expression: Impaired Initiation: No impairment Automatic Speech: Name;Social Response Level of Generative/Spontaneous Verbalization: Conversation Repetition: Impaired Level of Impairment: Word level (occasional phonemic errors noted) Naming: Impairment Confrontation: Within functional limits Other Naming Comments: anomia in conversational speech Verbal Errors: Semantic paraphasias;Phonemic paraphasias;Aware of errors;Other (comment) (anomia) Pragmatics: No impairment Interfering Components: Attention Non-Verbal Means of Communication: Not applicable Written Expression Written Expression: Not tested   Oral / Motor Motor Speech Overall Motor Speech: Appears within functional limits for tasks assessed   GO       Hayley [name], [name].A. CCC-SLP [phone]  Hayley [name] 01/18/2014, 12:22 PM

## 2014-01-19 ENCOUNTER — Inpatient Hospital Stay (HOSPITAL_COMMUNITY): Payer: Medicare Other

## 2014-01-19 LAB — COMPREHENSIVE METABOLIC PANEL
ALT: 8 U/L (ref 0–35)
ANION GAP: 10 (ref 5–15)
AST: 15 U/L (ref 0–37)
Albumin: 3.1 g/dL — ABNORMAL LOW (ref 3.5–5.2)
Alkaline Phosphatase: 80 U/L (ref 39–117)
BUN: 8 mg/dL (ref 6–23)
CO2: 31 mEq/L (ref 19–32)
CREATININE: 0.64 mg/dL (ref 0.50–1.10)
Calcium: 8.8 mg/dL (ref 8.4–10.5)
Chloride: 101 mEq/L (ref 96–112)
GFR calc Af Amer: 88 mL/min — ABNORMAL LOW (ref >90)
GFR calc non Af Amer: 76 mL/min — ABNORMAL LOW (ref >90)
Glucose, Bld: 115 mg/dL — ABNORMAL HIGH (ref 70–99)
Potassium: 3.8 mEq/L (ref 3.7–5.3)
SODIUM: 142 meq/L (ref 137–147)
TOTAL PROTEIN: 6.2 g/dL (ref 6.0–8.3)
Total Bilirubin: 0.4 mg/dL (ref 0.3–1.2)

## 2014-01-19 LAB — CBC WITH DIFFERENTIAL/PLATELET
Basophils Absolute: 0 10*3/uL (ref 0.0–0.1)
Basophils Relative: 0 % (ref 0–1)
Eosinophils Absolute: 0.2 10*3/uL (ref 0.0–0.7)
Eosinophils Relative: 2 % (ref 0–5)
HCT: 38.5 % (ref 36.0–46.0)
HEMOGLOBIN: 12.7 g/dL (ref 12.0–15.0)
LYMPHS ABS: 2.5 10*3/uL (ref 0.7–4.0)
LYMPHS PCT: 35 % (ref 12–46)
MCH: 29.1 pg (ref 26.0–34.0)
MCHC: 33 g/dL (ref 30.0–36.0)
MCV: 88.3 fL (ref 78.0–100.0)
MONOS PCT: 8 % (ref 3–12)
Monocytes Absolute: 0.6 10*3/uL (ref 0.1–1.0)
NEUTROS ABS: 3.9 10*3/uL (ref 1.7–7.7)
Neutrophils Relative %: 55 % (ref 43–77)
PLATELETS: 144 10*3/uL — AB (ref 150–400)
RBC: 4.36 MIL/uL (ref 3.87–5.11)
RDW: 13.5 % (ref 11.5–15.5)
WBC: 7.2 10*3/uL (ref 4.0–10.5)

## 2014-01-19 LAB — LIPID PANEL
Cholesterol: 117 mg/dL (ref 0–200)
HDL: 60 mg/dL (ref >39)
LDL Cholesterol: 46 mg/dL (ref 0–99)
TRIGLYCERIDES: 55 mg/dL (ref <150)
Total CHOL/HDL Ratio: 2 RATIO
VLDL: 11 mg/dL (ref 0–40)

## 2014-01-19 LAB — PROTIME-INR
INR: 3.13 — AB (ref 0.00–1.49)
PROTHROMBIN TIME: 32.2 s — AB (ref 11.6–15.2)

## 2014-01-19 LAB — HEMOGLOBIN A1C
Hgb A1c MFr Bld: 6.6 % — ABNORMAL HIGH (ref <5.7)
MEAN PLASMA GLUCOSE: 143 mg/dL — AB (ref <117)

## 2014-01-19 MED ORDER — APIXABAN 5 MG PO TABS: ORAL_TABLET | ORAL | Status: DC

## 2014-01-19 NOTE — Discharge Summary (Signed)
Physician Discharge Summary    Hayley [name]  MR#: [id number]  DOB:14-Jul-1920  Date of Admission: 01/17/2014 Date of Discharge: 01/19/2014  Attending Physician:Braydon Kullman  Patient's QIH:KVQQV,ZDGL M, MD  Consults:Treatment Team:  Md Stroke, MD  Discharge Diagnoses: Active Problems:   TIA (transient ischemic attack)   Discharge Medications:   Medication List    STOP taking these medications       warfarin 5 MG tablet  Commonly known as:  COUMADIN      TAKE these medications       ALPRAZolam 0.25 MG tablet  Commonly known as:  XANAX  Take 0.25 mg by mouth 3 (three) times daily as needed. For anxiety.     apixaban 5 MG Tabs tablet  Commonly known as:  ELIQUIS  Take 1/2 of a tab (2.5mg ) twice daily starting on Saturday, 8/15. Do not take with coumadin.     Biotin 5000 MCG Tabs  Take 5,000 mg by mouth daily.     calcium-vitamin D 500-200 MG-UNIT per tablet  Commonly known as:  OSCAL WITH D  Take 1 tablet by mouth daily.     diltiazem 120 MG 12 hr capsule  Commonly known as:  CARDIZEM SR  Take 1 capsule (120 mg total) by mouth 2 (two) times daily.     Echinacea 400 MG Caps  Take 1 capsule by mouth daily.     furosemide 20 MG tablet  Commonly known as:  LASIX  Take 20 mg by mouth 2 (two) times daily.     levothyroxine 100 MCG tablet  Commonly known as:  SYNTHROID, LEVOTHROID  Take 100 mcg by mouth daily.     metoprolol tartrate 25 MG tablet  Commonly known as:  LOPRESSOR  Take 1 tablet (25 mg total) by mouth 2 (two) times daily.     multivitamin with minerals Tabs tablet  Take 1 tablet by mouth daily.     potassium chloride SA 20 MEQ tablet  Commonly known as:  K-DUR,KLOR-CON  Take 10-20 mEq by mouth daily.     sertraline 100 MG tablet  Commonly known as:  ZOLOFT  Take 100 mg by mouth daily.     simvastatin 40 MG tablet  Commonly known as:  ZOCOR  Take 1 tablet (40 mg total) by mouth every evening.     thiamine 100 MG tablet   Commonly known as:  VITAMIN B-1  Take 100 mg by mouth daily.     vitamin C 500 MG tablet  Commonly known as:  ASCORBIC ACID  Take 500 mg by mouth daily.     vitamin E 1000 UNIT capsule  Take 1,000 Units by mouth daily.        Hospital Procedures: Ct Angio Head W/cm &/or Wo Cm  01/17/2014   CLINICAL DATA:  80 year old female with slurred speech, last seen normal at 1100 hrs. Code stroke. Initial encounter.  EXAM: CT ANGIOGRAPHY HEAD AND NECK  TECHNIQUE: Multidetector CT imaging of the head and neck was performed using the standard protocol during bolus administration of intravenous contrast. Multiplanar CT image reconstructions and MIPs were obtained to evaluate the vascular anatomy. Carotid stenosis measurements (when applicable) are obtained utilizing NASCET criteria, using the distal internal carotid diameter as the denominator.  CONTRAST:  OMNIPAQUE IOHEXOL 350 MG/ML SOLN  COMPARISON:  Head CT without contrast 1605 hr.  FINDINGS: CTA HEAD FINDINGS  No abnormal enhancement identified. Stable cerebral white matter hypodensity. No evidence of cortically based acute infarction identified. No intracranial  mass effect.  VASCULAR FINDINGS:  Major intracranial venous structures are normally enhancing.  Codominant distal vertebral arteries are normal. Normal vertebrobasilar junction. Normal PICA origins. No basilar artery stenosis. Normal SCA origins. PCA origins are within normal limits. Diminutive left posterior communicating artery, the right is more diminutive or absent. The left PCA branches are patent but appear mildly diminutive compared to those on the right (series 12, image 25 versus image 19). However, no PCA stenosis is identified.  Both ICA siphons are patent with minimal calcified plaque, and no ICA siphon stenosis. Ophthalmic and left posterior communicating artery origins are within normal limits. Carotid termini are normal. MCA and ACA origins are within normal limits. Anterior  communicating artery and bilateral ACA branches within normal limits. Normal left MCA origin. Left MCA branches are within normal limits. Normal right MCA origin. Subtle decreased attenuation of the right MCA branches, similar to the left PCA findings, but no right MCA stenosis or major branch occlusion identified.  Review of the MIP images confirms the above findings.  CTA NECK FINDINGS  Negative lung apices. Thyromegaly with mild retrosternal extension. Streak artifact at the thoracic inlet related to right shoulder arthroplasty hardware. Negative larynx, pharynx, parapharyngeal spaces, retropharyngeal space, sublingual space, submandibular glands, parotid glands, and orbits soft tissues (postoperative changes to the globes. Visualized paranasal sinuses and mastoids are clear. Degenerative osseous changes in the spine. No acute osseous abnormality identified. No cervical lymphadenopathy.  VASCULAR FINDINGS:  Dolichoectatic distal aortic arch. The great vessel origins are not entirely included on these images. Mild for age calcified plaque in the arch.  The right CCA origin is included and normal. Negative right CCA and right carotid bifurcation. Negative right ICA origin and bulb. Severe tortuosity of the cervical right ICA distal to the bulb, and widespread beaded appearance (see series 9, image 123) in keeping with fibromuscular dysplasia. The right ICA is patent to the skullbase.  No proximal right subclavian artery stenosis. Normal right vertebral artery origin. Negative cervical right vertebral artery.  Left CCA origin not included. Left CCA is patent negative left carotid bifurcation. Negative left ICA origin and bulb. Similar to the right side, severe tortuosity of the post bulb are cervical left ICA. However, subtle if any beaded appearance on this side. Otherwise negative cervical left ICA.  The left subclavian origin is included. No proximal left subclavian artery stenosis. Mild calcified plaque at the  left vertebral artery origin without stenosis. Some obscuration of the proximal left vertebral artery due to the streak artifact at the thoracic inlet plus dense surrounding paravertebral venous contrast. Otherwise, negative cervical left vertebral artery.  Review of the MIP images confirms the above findings.  IMPRESSION: 1. Negative for age neck CTA except for tortuous bilateral cervical ICAs, and right ICA fibromuscular dysplasia. 2. Essentially negative intracranial CTA; subtle decreased enhancement in the left PCA and right MCA branches may represent normal anatomic variation. No intracranial artery stenosis or occlusion identified. 3. Stable CT appearance of the brain. No changes of acute cortically based infarct are identified.   Electronically Signed   By: Augusto Gamble M.D.   On: 01/17/2014 16:56   Ct Head Wo Contrast  01/17/2014   CLINICAL DATA:  Code stroke, slurred speech  EXAM: CT HEAD WITHOUT CONTRAST  TECHNIQUE: Contiguous axial images were obtained from the base of the skull through the vertex without intravenous contrast.  COMPARISON:  03/25/2013  FINDINGS: No evidence of parenchymal hemorrhage or extra-axial fluid collection. No mass lesion, mass effect, or  midline shift.  No CT evidence of acute infarction.  Extensive small vessel ischemic changes, including in the subcortical right frontal lobe. Intracranial atherosclerosis.  Global cortical atrophy, likely age appropriate. No ventriculomegaly.  The visualized paranasal sinuses are essentially clear. The mastoid air cells are unopacified.  No evidence of calvarial fracture.  IMPRESSION: No evidence of acute intracranial abnormality.  Atrophy with small vessel ischemic changes and intracranial atherosclerosis.  These results were called by telephone at the time of interpretation on 01/17/2014 at 4:16 pm to Dr. Chaney Malling , who verbally acknowledged these results.   Electronically Signed   By: Charline Bills M.D.   On: 01/17/2014 16:16   Ct Angio  Neck W/cm &/or Wo/cm  01/17/2014   CLINICAL DATA:  80 year old female with slurred speech, last seen normal at 1100 hrs. Code stroke. Initial encounter.  EXAM: CT ANGIOGRAPHY HEAD AND NECK  TECHNIQUE: Multidetector CT imaging of the head and neck was performed using the standard protocol during bolus administration of intravenous contrast. Multiplanar CT image reconstructions and MIPs were obtained to evaluate the vascular anatomy. Carotid stenosis measurements (when applicable) are obtained utilizing NASCET criteria, using the distal internal carotid diameter as the denominator.  CONTRAST:  OMNIPAQUE IOHEXOL 350 MG/ML SOLN  COMPARISON:  Head CT without contrast 1605 hr.  FINDINGS: CTA HEAD FINDINGS  No abnormal enhancement identified. Stable cerebral white matter hypodensity. No evidence of cortically based acute infarction identified. No intracranial mass effect.  VASCULAR FINDINGS:  Major intracranial venous structures are normally enhancing.  Codominant distal vertebral arteries are normal. Normal vertebrobasilar junction. Normal PICA origins. No basilar artery stenosis. Normal SCA origins. PCA origins are within normal limits. Diminutive left posterior communicating artery, the right is more diminutive or absent. The left PCA branches are patent but appear mildly diminutive compared to those on the right (series 12, image 25 versus image 19). However, no PCA stenosis is identified.  Both ICA siphons are patent with minimal calcified plaque, and no ICA siphon stenosis. Ophthalmic and left posterior communicating artery origins are within normal limits. Carotid termini are normal. MCA and ACA origins are within normal limits. Anterior communicating artery and bilateral ACA branches within normal limits. Normal left MCA origin. Left MCA branches are within normal limits. Normal right MCA origin. Subtle decreased attenuation of the right MCA branches, similar to the left PCA findings, but no right MCA  stenosis or major branch occlusion identified.  Review of the MIP images confirms the above findings.  CTA NECK FINDINGS  Negative lung apices. Thyromegaly with mild retrosternal extension. Streak artifact at the thoracic inlet related to right shoulder arthroplasty hardware. Negative larynx, pharynx, parapharyngeal spaces, retropharyngeal space, sublingual space, submandibular glands, parotid glands, and orbits soft tissues (postoperative changes to the globes. Visualized paranasal sinuses and mastoids are clear. Degenerative osseous changes in the spine. No acute osseous abnormality identified. No cervical lymphadenopathy.  VASCULAR FINDINGS:  Dolichoectatic distal aortic arch. The great vessel origins are not entirely included on these images. Mild for age calcified plaque in the arch.  The right CCA origin is included and normal. Negative right CCA and right carotid bifurcation. Negative right ICA origin and bulb. Severe tortuosity of the cervical right ICA distal to the bulb, and widespread beaded appearance (see series 9, image 123) in keeping with fibromuscular dysplasia. The right ICA is patent to the skullbase.  No proximal right subclavian artery stenosis. Normal right vertebral artery origin. Negative cervical right vertebral artery.  Left CCA origin not included.  Left CCA is patent negative left carotid bifurcation. Negative left ICA origin and bulb. Similar to the right side, severe tortuosity of the post bulb are cervical left ICA. However, subtle if any beaded appearance on this side. Otherwise negative cervical left ICA.  The left subclavian origin is included. No proximal left subclavian artery stenosis. Mild calcified plaque at the left vertebral artery origin without stenosis. Some obscuration of the proximal left vertebral artery due to the streak artifact at the thoracic inlet plus dense surrounding paravertebral venous contrast. Otherwise, negative cervical left vertebral artery.  Review of the  MIP images confirms the above findings.  IMPRESSION: 1. Negative for age neck CTA except for tortuous bilateral cervical ICAs, and right ICA fibromuscular dysplasia. 2. Essentially negative intracranial CTA; subtle decreased enhancement in the left PCA and right MCA branches may represent normal anatomic variation. No intracranial artery stenosis or occlusion identified. 3. Stable CT appearance of the brain. No changes of acute cortically based infarct are identified.   Electronically Signed   By: Augusto GambleLee  Hall M.D.   On: 01/17/2014 16:56   Mr Brain Wo Contrast  01/18/2014   CLINICAL DATA:  80 year old female with abnormal speech episode lasting about 3 hrs. Initial encounter.  EXAM: MRI HEAD WITHOUT CONTRAST  TECHNIQUE: Multiplanar, multiecho pulse sequences of the brain and surrounding structures were obtained without intravenous contrast.  COMPARISON:  Head and neck CTA 01/17/2014, and earlier.  FINDINGS: Cerebral volume is within normal limits for age. Susceptibility artifact on diffusion imaging in the left parietal lobe related to chronic blood products, see below. No restricted diffusion to suggest acute infarction. No midline shift, mass effect, evidence of mass lesion, ventriculomegaly, extra-axial collection or acute intracranial hemorrhage. Cervicomedullary junction and pituitary are within normal limits. Negative visualized cervical spine. Major intracranial vascular flow voids are preserved.  Chronic hemorrhage in the left parietal lobe with mildly heterogeneous T2 signal (series 8, image 17 and series 9, image 17). No associated edema or mass effect. Suggestion of mild associated cortical encephalomalacia. Multiple other scattered foci of chronic micro hemorrhage in the left hemisphere, mostly the left PCA territory. Occasional small foci of chronic micro hemorrhage also in the right PCA territory. Superimposed Patchy and confluent cerebral white matter T2 and FLAIR hyperintensity in both hemispheres.  No other cortical encephalomalacia. Deep gray matter nuclei, brainstem and cerebellum are within normal limits for age.  Mild right mastoid effusion. Visible internal auditory structures appear normal. Trace left mastoid fluid. Paranasal sinuses are clear. Postoperative changes to the globes. Visualized scalp soft tissues are within normal limits. Normal bone marrow signal.  IMPRESSION: 1.  No acute intracranial abnormality. 2. Focus of chronic hemorrhage in the left parietal lobe, nonspecific and could for example be posttraumatic. There are multiple additional small chronic micro hemorrhages in the hemispheres, more numerous on the left, but the appearance does not rise to the level typically seen with amyloid angiopathy. No strong evidence of vascular malformation. Sequelae of chronic ischemia felt less likely. 3. Otherwise nonspecific white matter signal changes.   Electronically Signed   By: Augusto GambleLee  Hall M.D.   On: 01/18/2014 08:44    History of Present Illness: Presented on 8/9 after having a period of aphasia. This resolved prior to admission and she was admitted for w/u of TIA   Hospital Course: TIA - MRI showed no acute stroke. However it did show several chronic micro hemorrhages. She remained asymptomatic throughout her hospital stay. PT was ordered and rec'd home PT, which has  been placed. TTE was performed and that result is pending, however, given no new murmur on exam, I doubt significant pathology and this can be reviewed at her outpatient f/u. MRA of the carotids showed no significant stenosis. Given the new found micro hemorrhages and the development of TIA while therapeutic on coumadin,  she is considered to have failed coumadin and will be changed from coumadin to eliquis 2.5mg  (dosing lower based on age and per the neurology team recommendations). She does not need to start the eliquis until her INR is < 2. INR was downtrending but still ~3 on day of discharge. Will have her wait another 3  days prior to starting eliquis, with a start date of Saturday 7/15 clearly instructed and written on her medication list. She will have her INR confirmed at office visit on Monday. This office visit detail was relayed personally to patient and daughter on day of discharge.   Micro hemorrhages - chronic but new finding on MRI this admission. Changed coumadin to eliquis given superior safety data on lower bleeding rates. EEG performed that showed no seizure activity. Pt is to follow up w/ neurology in a few weeks, per their recommendations   HLD - pharmacy noted that patient is on zocor and ca channel blocker . I will leave her on the zocor at this time given normal LFTs in hospital, and will have her PCP address this possible change to lipitor in the outpatient setting. Lipids were at goal in the hospital   Day of Discharge Exam BP 117/51  Pulse 66  Temp(Src) 97.9 F (36.6 C) (Oral)  Resp 18  Ht 5\' 4"  (1.626 m)  Wt 152 lb 14.4 oz (69.355 kg)  BMI 26.23 kg/m2  SpO2 97%  Physical Exam: General appearance: WF in NAD  Eyes: no scleral icterus Throat: oropharynx moist without erythema Resp: CTAB, no wheezes or rales  Cardio: ir/ir, normal rate, no MRG GI: soft, non-tender; bowel sounds normal; no masses,  no organomegaly Extremities: no clubbing, cyanosis or edema  Discharge Labs:  Recent Labs  01/18/14 0441 01/19/14 0502  NA 143 142  K 3.7 3.8  CL 102 101  CO2 30 31  GLUCOSE 104* 115*  BUN 7 8  CREATININE 0.57 0.64  CALCIUM 8.7 8.8    Recent Labs  01/18/14 0441 01/19/14 0502  AST 16 15  ALT 9 8  ALKPHOS 84 80  BILITOT 0.4 0.4  PROT 6.0 6.2  ALBUMIN 3.1* 3.1*    Recent Labs  01/18/14 0441 01/19/14 0502  WBC 7.3 7.2  NEUTROABS 4.3 3.9  HGB 12.3 12.7  HCT 37.1 38.5  MCV 87.5 88.3  PLT 143* 144*   Lab Results  Component Value Date   INR 3.13* 01/19/2014   INR 3.26* 01/18/2014   INR 2.91* 01/17/2014   PROTIME 30.0* 04/16/2011   No results found for this  basename: CKTOTAL, CKMB, CKMBINDEX, TROPONINI,  in the last 72 hours  Recent Labs  01/18/14 0441  TSH 2.780   No results found for this basename: VITAMINB12, FOLATE, FERRITIN, TIBC, IRON, RETICCTPCT,  in the last 72 hours  Discharge instructions:     Discharge Instructions   Diet - low sodium heart healthy    Complete by:  As directed      Discharge instructions    Complete by:  As directed   Please follow up w/ Georga Hacking at [facility] on Monday, 8/17.  Please no longer take coumadin.  Please start your  eliquis on Saturday morning.     Increase activity slowly    Complete by:  As directed           01-Home or Self Care Follow-up Information   Follow up with Xu,Jindong, MD In 4 weeks.   Specialty:  Neurology   Contact information:   [address] [place] [address] [phone]       Disposition: home w/ daughter and HHPT  Follow-up Appts: Follow-up with Dr. Timothy Lasso at [facility] on Monday at 1PM.   Condition on Discharge: stable  Tests Needing Follow-up: INR on Monday. Goal is to be around 1. No longer on coumadin  Time spent in discharge (includes decision making & examination of pt): 45 minutes   Signed: Ossie Yebra 01/19/2014, 11:24 AM

## 2014-01-19 NOTE — Plan of Care (Signed)
Pt doing well this AM  EEG being performed   If normal EEG read, will be ready for discharge on 2.5mg  BID of eliquis to start on Thursday PM w/ f/u in our office on Monday.   Formal note to follow   Alysia PennaHOLWERDA, Esaul Dorwart, MD 01/19/2014 8:51 AM

## 2014-01-19 NOTE — Progress Notes (Signed)
EEG completed; results pending.    

## 2014-01-19 NOTE — Progress Notes (Signed)
Discharge orders received.  Discharge instructions and follow-up information reviewed with the patient and her daughter.  VSS upon discharge and education complete.  Transported out via wheelchair daughter present. Sondra ComeSilva, Bowden Boody M, RN 01/19/2014 1:27 PM

## 2014-01-19 NOTE — Progress Notes (Signed)
Stroke Team Progress Note  HISTORY Hayley [name] is a pleasant 80 yo female. She lives at home with her daughter and granddaughter. She was in her NSOH until she woke from a nap around noon today. She had an expressive aphasia. No motor deficits noted but her words were just jibberish. This lasted in all About 3 hours. She is now back to her baseline. CT scan head shows no acute problem  Patient was not administered TPA secondary to anticoagulation with warfarin and resolution of symptoms. She was admitted to the neuro floor bed for further evaluation and treatment.  SUBJECTIVE Her daughter is at the bedside.  Overall she feels her condition is improved.  EEG is normal OBJECTIVE Most recent Vital Signs: Filed Vitals:   01/18/14 2149 01/19/14 0149 01/19/14 0552 01/19/14 0932  BP: 116/57 145/78 151/70 117/51  Pulse: 54 85 79 66  Temp: 98 F (36.7 C) 97.7 F (36.5 C) 98 F (36.7 C) 97.9 F (36.6 C)  TempSrc: Oral Oral Oral Oral  Resp: 18 18 20 18   Height:      Weight:      SpO2: 99% 98% 98% 97%   CBG (last 3)   Recent Labs  01/17/14 1609  GLUCAP 147*    IV Fluid Intake:     MEDICATIONS  . atorvastatin  20 mg Oral q1800  . diltiazem  120 mg Oral BID  . docusate sodium  100 mg Oral BID  . furosemide  20 mg Oral BID  . levothyroxine  100 mcg Oral QAC breakfast  . metoprolol tartrate  25 mg Oral BID  . potassium chloride SA  10 mEq Oral Daily  . sertraline  100 mg Oral Daily  . sodium chloride  3 mL Intravenous Q12H   PRN:  acetaminophen, acetaminophen, ALPRAZolam, ondansetron (ZOFRAN) IV, ondansetron  Diet:  Cardiac   Activity:  Bedrest    DVT Prophylaxis:  warfarin  CLINICALLY SIGNIFICANT STUDIES Basic Metabolic Panel:   Recent Labs Lab 01/18/14 0441 01/19/14 0502  NA 143 142  K 3.7 3.8  CL 102 101  CO2 30 31  GLUCOSE 104* 115*  BUN 7 8  CREATININE 0.57 0.64  CALCIUM 8.7 8.8   Liver Function Tests:   Recent Labs Lab 01/18/14 0441 01/19/14 0502   AST 16 15  ALT 9 8  ALKPHOS 84 80  BILITOT 0.4 0.4  PROT 6.0 6.2  ALBUMIN 3.1* 3.1*   CBC:   Recent Labs Lab 01/18/14 0441 01/19/14 0502  WBC 7.3 7.2  NEUTROABS 4.3 3.9  HGB 12.3 12.7  HCT 37.1 38.5  MCV 87.5 88.3  PLT 143* 144*   Coagulation:   Recent Labs Lab 01/17/14 1606 01/18/14 0441 01/19/14 0502  LABPROT 30.4* 33.2* 32.2*  INR 2.91* 3.26* 3.13*   Cardiac Enzymes: No results found for this basename: CKTOTAL, CKMB, CKMBINDEX, TROPONINI,  in the last 168 hours Urinalysis: No results found for this basename: COLORURINE, APPERANCEUR, LABSPEC, PHURINE, GLUCOSEU, HGBUR, BILIRUBINUR, KETONESUR, PROTEINUR, UROBILINOGEN, NITRITE, LEUKOCYTESUR,  in the last 168 hours Lipid Panel    Component Value Date/Time   CHOL 117 01/19/2014 0502   HgbA1C  Lab Results  Component Value Date   HGBA1C 6.8* 12/08/2010    Urine Drug Screen:   No results found for this basename: labopia,  cocainscrnur,  labbenz,  amphetmu,  thcu,  labbarb    Alcohol Level: No results found for this basename: ETH,  in the last 168 hours  Ct Angio Head W/cm &/or Wo  Cm  01/17/2014   CLINICAL DATA:  80 year old female with slurred speech, last seen normal at 1100 hrs. Code stroke. Initial encounter.  EXAM: CT ANGIOGRAPHY HEAD AND NECK  TECHNIQUE: Multidetector CT imaging of the head and neck was performed using the standard protocol during bolus administration of intravenous contrast. Multiplanar CT image reconstructions and MIPs were obtained to evaluate the vascular anatomy. Carotid stenosis measurements (when applicable) are obtained utilizing NASCET criteria, using the distal internal carotid diameter as the denominator.  CONTRAST:  OMNIPAQUE IOHEXOL 350 MG/ML SOLN  COMPARISON:  Head CT without contrast 1605 hr.  FINDINGS: CTA HEAD FINDINGS  No abnormal enhancement identified. Stable cerebral white matter hypodensity. No evidence of cortically based acute infarction identified. No intracranial mass  effect.  VASCULAR FINDINGS:  Major intracranial venous structures are normally enhancing.  Codominant distal vertebral arteries are normal. Normal vertebrobasilar junction. Normal PICA origins. No basilar artery stenosis. Normal SCA origins. PCA origins are within normal limits. Diminutive left posterior communicating artery, the right is more diminutive or absent. The left PCA branches are patent but appear mildly diminutive compared to those on the right (series 12, image 25 versus image 19). However, no PCA stenosis is identified.  Both ICA siphons are patent with minimal calcified plaque, and no ICA siphon stenosis. Ophthalmic and left posterior communicating artery origins are within normal limits. Carotid termini are normal. MCA and ACA origins are within normal limits. Anterior communicating artery and bilateral ACA branches within normal limits. Normal left MCA origin. Left MCA branches are within normal limits. Normal right MCA origin. Subtle decreased attenuation of the right MCA branches, similar to the left PCA findings, but no right MCA stenosis or major branch occlusion identified.  Review of the MIP images confirms the above findings.  CTA NECK FINDINGS  Negative lung apices. Thyromegaly with mild retrosternal extension. Streak artifact at the thoracic inlet related to right shoulder arthroplasty hardware. Negative larynx, pharynx, parapharyngeal spaces, retropharyngeal space, sublingual space, submandibular glands, parotid glands, and orbits soft tissues (postoperative changes to the globes. Visualized paranasal sinuses and mastoids are clear. Degenerative osseous changes in the spine. No acute osseous abnormality identified. No cervical lymphadenopathy.  VASCULAR FINDINGS:  Dolichoectatic distal aortic arch. The great vessel origins are not entirely included on these images. Mild for age calcified plaque in the arch.  The right CCA origin is included and normal. Negative right CCA and right carotid  bifurcation. Negative right ICA origin and bulb. Severe tortuosity of the cervical right ICA distal to the bulb, and widespread beaded appearance (see series 9, image 123) in keeping with fibromuscular dysplasia. The right ICA is patent to the skullbase.  No proximal right subclavian artery stenosis. Normal right vertebral artery origin. Negative cervical right vertebral artery.  Left CCA origin not included. Left CCA is patent negative left carotid bifurcation. Negative left ICA origin and bulb. Similar to the right side, severe tortuosity of the post bulb are cervical left ICA. However, subtle if any beaded appearance on this side. Otherwise negative cervical left ICA.  The left subclavian origin is included. No proximal left subclavian artery stenosis. Mild calcified plaque at the left vertebral artery origin without stenosis. Some obscuration of the proximal left vertebral artery due to the streak artifact at the thoracic inlet plus dense surrounding paravertebral venous contrast. Otherwise, negative cervical left vertebral artery.  Review of the MIP images confirms the above findings.  IMPRESSION: 1. Negative for age neck CTA except for tortuous bilateral cervical ICAs, and  right ICA fibromuscular dysplasia. 2. Essentially negative intracranial CTA; subtle decreased enhancement in the left PCA and right MCA branches may represent normal anatomic variation. No intracranial artery stenosis or occlusion identified. 3. Stable CT appearance of the brain. No changes of acute cortically based infarct are identified.   Electronically Signed   By: Augusto Gamble M.D.   On: 01/17/2014 16:56   Ct Head Wo Contrast  01/17/2014   CLINICAL DATA:  Code stroke, slurred speech  EXAM: CT HEAD WITHOUT CONTRAST  TECHNIQUE: Contiguous axial images were obtained from the base of the skull through the vertex without intravenous contrast.  COMPARISON:  03/25/2013  FINDINGS: No evidence of parenchymal hemorrhage or extra-axial fluid  collection. No mass lesion, mass effect, or midline shift.  No CT evidence of acute infarction.  Extensive small vessel ischemic changes, including in the subcortical right frontal lobe. Intracranial atherosclerosis.  Global cortical atrophy, likely age appropriate. No ventriculomegaly.  The visualized paranasal sinuses are essentially clear. The mastoid air cells are unopacified.  No evidence of calvarial fracture.  IMPRESSION: No evidence of acute intracranial abnormality.  Atrophy with small vessel ischemic changes and intracranial atherosclerosis.  These results were called by telephone at the time of interpretation on 01/17/2014 at 4:16 pm to Dr. Chaney Malling , who verbally acknowledged these results.   Electronically Signed   By: Charline Bills M.D.   On: 01/17/2014 16:16   Ct Angio Neck W/cm &/or Wo/cm  01/17/2014   CLINICAL DATA:  80 year old female with slurred speech, last seen normal at 1100 hrs. Code stroke. Initial encounter.  EXAM: CT ANGIOGRAPHY HEAD AND NECK  TECHNIQUE: Multidetector CT imaging of the head and neck was performed using the standard protocol during bolus administration of intravenous contrast. Multiplanar CT image reconstructions and MIPs were obtained to evaluate the vascular anatomy. Carotid stenosis measurements (when applicable) are obtained utilizing NASCET criteria, using the distal internal carotid diameter as the denominator.  CONTRAST:  OMNIPAQUE IOHEXOL 350 MG/ML SOLN  COMPARISON:  Head CT without contrast 1605 hr.  FINDINGS: CTA HEAD FINDINGS  No abnormal enhancement identified. Stable cerebral white matter hypodensity. No evidence of cortically based acute infarction identified. No intracranial mass effect.  VASCULAR FINDINGS:  Major intracranial venous structures are normally enhancing.  Codominant distal vertebral arteries are normal. Normal vertebrobasilar junction. Normal PICA origins. No basilar artery stenosis. Normal SCA origins. PCA origins are within normal  limits. Diminutive left posterior communicating artery, the right is more diminutive or absent. The left PCA branches are patent but appear mildly diminutive compared to those on the right (series 12, image 25 versus image 19). However, no PCA stenosis is identified.  Both ICA siphons are patent with minimal calcified plaque, and no ICA siphon stenosis. Ophthalmic and left posterior communicating artery origins are within normal limits. Carotid termini are normal. MCA and ACA origins are within normal limits. Anterior communicating artery and bilateral ACA branches within normal limits. Normal left MCA origin. Left MCA branches are within normal limits. Normal right MCA origin. Subtle decreased attenuation of the right MCA branches, similar to the left PCA findings, but no right MCA stenosis or major branch occlusion identified.  Review of the MIP images confirms the above findings.  CTA NECK FINDINGS  Negative lung apices. Thyromegaly with mild retrosternal extension. Streak artifact at the thoracic inlet related to right shoulder arthroplasty hardware. Negative larynx, pharynx, parapharyngeal spaces, retropharyngeal space, sublingual space, submandibular glands, parotid glands, and orbits soft tissues (postoperative changes to the  globes. Visualized paranasal sinuses and mastoids are clear. Degenerative osseous changes in the spine. No acute osseous abnormality identified. No cervical lymphadenopathy.  VASCULAR FINDINGS:  Dolichoectatic distal aortic arch. The great vessel origins are not entirely included on these images. Mild for age calcified plaque in the arch.  The right CCA origin is included and normal. Negative right CCA and right carotid bifurcation. Negative right ICA origin and bulb. Severe tortuosity of the cervical right ICA distal to the bulb, and widespread beaded appearance (see series 9, image 123) in keeping with fibromuscular dysplasia. The right ICA is patent to the skullbase.  No proximal  right subclavian artery stenosis. Normal right vertebral artery origin. Negative cervical right vertebral artery.  Left CCA origin not included. Left CCA is patent negative left carotid bifurcation. Negative left ICA origin and bulb. Similar to the right side, severe tortuosity of the post bulb are cervical left ICA. However, subtle if any beaded appearance on this side. Otherwise negative cervical left ICA.  The left subclavian origin is included. No proximal left subclavian artery stenosis. Mild calcified plaque at the left vertebral artery origin without stenosis. Some obscuration of the proximal left vertebral artery due to the streak artifact at the thoracic inlet plus dense surrounding paravertebral venous contrast. Otherwise, negative cervical left vertebral artery.  Review of the MIP images confirms the above findings.  IMPRESSION: 1. Negative for age neck CTA except for tortuous bilateral cervical ICAs, and right ICA fibromuscular dysplasia. 2. Essentially negative intracranial CTA; subtle decreased enhancement in the left PCA and right MCA branches may represent normal anatomic variation. No intracranial artery stenosis or occlusion identified. 3. Stable CT appearance of the brain. No changes of acute cortically based infarct are identified.   Electronically Signed   By: Augusto GambleLee  Hall M.D.   On: 01/17/2014 16:56   Mr Brain Wo Contrast  01/18/2014   CLINICAL DATA:  80 year old female with abnormal speech episode lasting about 3 hrs. Initial encounter.  EXAM: MRI HEAD WITHOUT CONTRAST  TECHNIQUE: Multiplanar, multiecho pulse sequences of the brain and surrounding structures were obtained without intravenous contrast.  COMPARISON:  Head and neck CTA 01/17/2014, and earlier.  FINDINGS: Cerebral volume is within normal limits for age. Susceptibility artifact on diffusion imaging in the left parietal lobe related to chronic blood products, see below. No restricted diffusion to suggest acute infarction. No midline  shift, mass effect, evidence of mass lesion, ventriculomegaly, extra-axial collection or acute intracranial hemorrhage. Cervicomedullary junction and pituitary are within normal limits. Negative visualized cervical spine. Major intracranial vascular flow voids are preserved.  Chronic hemorrhage in the left parietal lobe with mildly heterogeneous T2 signal (series 8, image 17 and series 9, image 17). No associated edema or mass effect. Suggestion of mild associated cortical encephalomalacia. Multiple other scattered foci of chronic micro hemorrhage in the left hemisphere, mostly the left PCA territory. Occasional small foci of chronic micro hemorrhage also in the right PCA territory. Superimposed Patchy and confluent cerebral white matter T2 and FLAIR hyperintensity in both hemispheres. No other cortical encephalomalacia. Deep gray matter nuclei, brainstem and cerebellum are within normal limits for age.  Mild right mastoid effusion. Visible internal auditory structures appear normal. Trace left mastoid fluid. Paranasal sinuses are clear. Postoperative changes to the globes. Visualized scalp soft tissues are within normal limits. Normal bone marrow signal.  IMPRESSION: 1.  No acute intracranial abnormality. 2. Focus of chronic hemorrhage in the left parietal lobe, nonspecific and could for example be posttraumatic. There are multiple  additional small chronic micro hemorrhages in the hemispheres, more numerous on the left, but the appearance does not rise to the level typically seen with amyloid angiopathy. No strong evidence of vascular malformation. Sequelae of chronic ischemia felt less likely. 3. Otherwise nonspecific white matter signal changes.   Electronically Signed   By: Augusto Gamble M.D.   On: 01/18/2014 08:44       Carotid Doppler  See CTA  2D Echocardiogram   pending  CXR  pending  EKG   Atrial fibrillation Left axis deviation Borderline T abnormalities, inferior leads Borderline prolonged QT  interval Therapy Recommendations  pending  Physical Exam  elderly Caucasian lady extremely hard of hearing.Awake alert. Afebrile. Head is nontraumatic. Neck is supple without bruit. Hearing is poor bilaterally Cardiac exam no murmur or gallop. Lungs are clear to auscultation. Distal pulses are well felt. Neurological Exam ;  Awake  Alert oriented x 3. Normal speech and language.eye movements full without nystagmus.fundi were not visualized. Vision acuity and fields appear normal. Hearing is normal. Palatal movements are normal. Face symmetric. Tongue midline. Normal strength, tone, reflexes and coordination. Normal sensation. Gait deferred. ASSESSMENT Hayley [name] is a 80 y.o. female presenting with transient episode of expressive aphasia lasting 3 hours likely left hemispheric TIA of cardioembolic etiology from atrial fibrillation despite optimal anticoagulation with warfarin with INR of 3. Brain MRI scan shows no acute infarct but shows several areas of tiny microhemorrhages including alongterm remote age left parietal hemorrhage and the patient having multiple falls makes her at high  longterm risk for bleeding complications with warfarin. Patient may benefit by switching to eliquis because of relative decrease risk of foreign hemorrhage On warfarin prior to admission. Now on no anticoagulants for secondary stroke prevention. Patient with resultant  No deficits.     Atrial Fibrillation  LDL pending  HbA1c 6.8-no h/o diabetes   Hospital day # 2  TREATMENT/PLAN I had a long discussion with the patient, daughter as well as with Dr Link Snuffer about her long-term bleeding risk if she stays on warfarin and considering switching to eliquis which has been shown to have slightly lower risk for intracranial bleeding. She remains at significant risk for recurrent strokes and TIAs given atrial fibrillation Recommend hold warfarin for now and when INR is below 2 start eliquis 2.5 mg twice daily.  Check 2-D echocardiogram, lipid profile. Mobilize out of bed. PT OT consults.    Followup as outpatient in stroke clinic with Dr. Roda Shutters in 4-6 weeks   Delia Heady, MD  SIGNED    To contact Stroke Continuity provider, please refer to [email or web address]. After hours, contact General Neurology

## 2014-01-19 NOTE — Progress Notes (Signed)
Physical Therapy Treatment Patient Details Name: Hayley [name] MRN: [id number] DOB: December 01, 1920 Today's Date: 01/19/2014    History of Present Illness Chaelyn is a pleasant 80 yo female. She lives at home with her daughter and granddaughter. At admission, she had an expressive aphasia, no motor deficits. She is now back to her baseline. CT scan head shows no acute problem.     PT Comments    Pt is having trouble with remembering instructions today but is consistent with last PT visit.  Her daughter was instructed to use contact and verbal cues for prompting her physical function, hand placement and safety.  Daughter is looking forward to PT starting but was reminded not to let pt transfer alone without guidance from the therapist.  Follow Up Recommendations  Home health PT;Supervision/Assistance - 24 hour     Equipment Recommendations  None recommended by PT    Recommendations for Other Services Other (comment) (HHPT)     Precautions / Restrictions Precautions Precautions: Fall Precaution Comments: Pt and family were instructed on fall precautions Restrictions Weight Bearing Restrictions: No Other Position/Activity Restrictions: Instructed daughter for pt's need to cue hand placement for transfers    Mobility  Bed Mobility Overal bed mobility: Modified Independent Bed Mobility: Sidelying to Sit   Sidelying to sit: Supervision          Transfers Overall transfer level: Needs assistance Equipment used: Rolling walker (2 wheeled) Transfers: Sit to/from [employer] Sit to Stand: Min assist Stand pivot transfers: Min assist       General transfer comment: requires tactile and visual cues for correct hand placement, needs consistent cues for success.  Informed daughter that the ptwould not be able to get up alone at night unless she could do the transfers without assist in the day  Ambulation/Gait Ambulation/Gait assistance: Min assist Ambulation  Distance (Feet): 100 Feet Assistive device: Rolling walker (2 wheeled) Gait Pattern/deviations: Step-through pattern;Wide base of support;Drifts right/left;Decreased dorsiflexion - right;Decreased dorsiflexion - left Gait velocity: decreased Gait velocity interpretation: Below normal speed for age/gender General Gait Details: Improving endurance but tends to let walker drift, doesn't maintain position in the walker.  Forgets corrections of the pattern with the walker   Stairs            Wheelchair Mobility    Modified Rankin (Stroke Patients Only) Modified Rankin (Stroke Patients Only) Pre-Morbid Rankin Score: Slight disability Modified Rankin: Moderate disability     Balance Overall balance assessment: Needs assistance Sitting-balance support: Feet supported;Bilateral upper extremity supported Sitting balance-Leahy Scale: Good   Postural control: Posterior lean Standing balance support: Bilateral upper extremity supported Standing balance-Leahy Scale: Fair Standing balance comment: cues for hands and correction of loss of attention to the task                    Cognition Arousal/Alertness: Awake/alert Behavior During Therapy: WFL for tasks assessed/performed Overall Cognitive Status: Impaired/Different from baseline Area of Impairment: Orientation;Attention;Memory;Following commands;Safety/judgement;Awareness;Problem solving Orientation Level: Person Current Attention Level: Alternating;Divided Memory: Decreased recall of precautions;Decreased short-term memory Following Commands: Follows one step commands inconsistently;Follows one step commands with increased time Safety/Judgement: Decreased awareness of safety;Decreased awareness of deficits Awareness: Emergent Problem Solving: Slow processing;Decreased initiation;Difficulty sequencing;Requires verbal cues;Requires tactile cues      Exercises      General Comments General comments (skin integrity,  edema, etc.): Daughter in to instruct for safety, pt unable to verbalize where she is nor could she recall the name of the hospital after  being told      Pertinent Vitals/Pain Pain Assessment: No/denies pain    Home Living                      Prior Function            PT Goals (current goals can now be found in the care plan section) Acute Rehab PT Goals Patient Stated Goal: To be able to move herself around better     Frequency  Min 4X/week    PT Plan      Co-evaluation             End of Session   Activity Tolerance: Patient limited by fatigue;Other (comment) (Limited by weakness) Patient left: in chair;with call bell/phone within reach;with family/visitor present     Time: 1207-1234 PT Time Calculation (min): 27 min  Charges:  $Gait Training: 8-22 mins $Therapeutic Activity: 8-22 mins                    G Codes:      Ivar DrapeStout, Payten Beaumier E 01/19/2014, 12:59 PM  Samul Dadauth Anesa Fronek, PT MS Acute Rehab Dept. Number: [phone]

## 2014-01-19 NOTE — Progress Notes (Signed)
ANTICOAGULATION CONSULT NOTE - Initial Consult  Pharmacy Consult for Eliquis Indication: atrial fibrillation  Allergies  Allergen Reactions  . Sulfa Antibiotics Hives  . Ultram [Tramadol] Other (See Comments)    Hallucinations.   Vital Signs: Temp: 97.9 F (36.6 C) (08/11 0932) Temp src: Oral (08/11 0932) BP: 117/51 mmHg (08/11 0932) Pulse Rate: 66 (08/11 0932)  Labs:  Recent Labs  01/17/14 1606 01/17/14 1616 01/18/14 0441 01/19/14 0502  HGB 14.7 15.6* 12.3 12.7  HCT 43.3 46.0 37.1 38.5  PLT 170  --  143* 144*  APTT 47*  --   --   --   LABPROT 30.4*  --  33.2* 32.2*  INR 2.91*  --  3.26* 3.13*  CREATININE 0.64 0.70 0.57 0.64    Medical History: Past Medical History  Diagnosis Date  . Chronic ITP (idiopathic thrombocytopenia)   . Chronic edema     BLE  . Thyroid disease     Hypothyroid  . Hypertension     MD took pt off of meds 04/2012  . Hypothyroidism   . Pelvic fracture October 2014    Recent fall  . Chronic atrial fibrillation     History of RVR June 2012    Assessment: 80 year old female on Coumadin PTA for chronic Afib.  She was admitted after an episode of expressive aphasia.  Head CT shows now acute problem. MRI positive for multiple micro hemorrhages, Plan to change coumadin to Eliquis 2.5 BID when INR < 2 per neuro recommendation. INR 3.13 this morning, no coumadin last night.    Goal of Therapy:  Monitor platelets by anticoagulation protocol: Yes   Plan: Will continue monitor daily INR Start Eliquis when INR < 2  Thank you.  Bayard HuggerMei Jahree Dermody, PharmD, BCPS  Clinical Pharmacist  Pager: [phone]  01/19/2014,10:42 AM

## 2014-01-19 NOTE — Procedures (Signed)
History: 80 yo F with transient aphasia  Sedation: None  Technique: This is a 17 channel routine scalp EEG performed at the bedside with bipolar and monopolar montages arranged in accordance to the international 10/20 system of electrode placement. One channel was dedicated to EKG recording.    Background: The predominance of this EEG occurs durign drowsiness and sleep with an increase in irregular slow activities as well as organized sleep structures. There are brief periods of waking during which lower frequencies attenuate and a PDR of 8 Hz is seen.   Photic stimulation: Physiologic driving is not performed  EEG Abnormalities: none  Clinical Interpretation: This normal EEG is recorded in the waking and sleep state. There was no seizure or seizure predisposition recorded on this study.   Ritta SlotMcNeill Yolinda Duerr, MD [facility] [phone]  If 7pm- 7am, please page neurology on call as listed in AMION.

## 2014-02-01 ENCOUNTER — Ambulatory Visit: Payer: Self-pay | Admitting: Pharmacist Clinician (PhC)/ Clinical Pharmacy Specialist

## 2014-02-02 ENCOUNTER — Ambulatory Visit: Payer: Self-pay | Admitting: Pharmacist Clinician (PhC)/ Clinical Pharmacy Specialist

## 2014-02-02 DIAGNOSIS — Z7901 Long term (current) use of anticoagulants: Secondary | ICD-10-CM

## 2014-02-23 ENCOUNTER — Emergency Department (HOSPITAL_COMMUNITY): Payer: Medicare Other

## 2014-02-23 ENCOUNTER — Encounter (HOSPITAL_COMMUNITY): Payer: Self-pay | Admitting: Emergency Medicine

## 2014-02-23 ENCOUNTER — Inpatient Hospital Stay (HOSPITAL_COMMUNITY)
Admission: EM | Admit: 2014-02-23 | Discharge: 2014-02-26 | DRG: 065 | Disposition: A | Discharge status: Hospice | Payer: Medicare Other | Attending: Neurology | Admitting: Neurology

## 2014-02-23 DIAGNOSIS — Z8673 Personal history of transient ischemic attack (TIA), and cerebral infarction without residual deficits: Secondary | ICD-10-CM

## 2014-02-23 DIAGNOSIS — Z515 Encounter for palliative care: Secondary | ICD-10-CM

## 2014-02-23 DIAGNOSIS — Z823 Family history of stroke: Secondary | ICD-10-CM | POA: Diagnosis not present

## 2014-02-23 DIAGNOSIS — D693 Immune thrombocytopenic purpura: Secondary | ICD-10-CM | POA: Diagnosis present

## 2014-02-23 DIAGNOSIS — T45515A Adverse effect of anticoagulants, initial encounter: Secondary | ICD-10-CM | POA: Diagnosis present

## 2014-02-23 DIAGNOSIS — G8190 Hemiplegia, unspecified affecting unspecified side: Secondary | ICD-10-CM | POA: Diagnosis present

## 2014-02-23 DIAGNOSIS — I4891 Unspecified atrial fibrillation: Secondary | ICD-10-CM | POA: Diagnosis present

## 2014-02-23 DIAGNOSIS — Z66 Do not resuscitate: Secondary | ICD-10-CM | POA: Diagnosis present

## 2014-02-23 DIAGNOSIS — I611 Nontraumatic intracerebral hemorrhage in hemisphere, cortical: Secondary | ICD-10-CM

## 2014-02-23 DIAGNOSIS — I482 Chronic atrial fibrillation, unspecified: Secondary | ICD-10-CM

## 2014-02-23 DIAGNOSIS — R29810 Facial weakness: Secondary | ICD-10-CM | POA: Diagnosis present

## 2014-02-23 DIAGNOSIS — R4701 Aphasia: Secondary | ICD-10-CM | POA: Diagnosis not present

## 2014-02-23 DIAGNOSIS — I10 Essential (primary) hypertension: Secondary | ICD-10-CM | POA: Diagnosis present

## 2014-02-23 DIAGNOSIS — I619 Nontraumatic intracerebral hemorrhage, unspecified: Secondary | ICD-10-CM | POA: Diagnosis present

## 2014-02-23 DIAGNOSIS — Z96619 Presence of unspecified artificial shoulder joint: Secondary | ICD-10-CM

## 2014-02-23 LAB — RAPID URINE DRUG SCREEN, HOSP PERFORMED
Amphetamines: NOT DETECTED
BENZODIAZEPINES: NOT DETECTED
Barbiturates: NOT DETECTED
Cocaine: NOT DETECTED
Opiates: NOT DETECTED
Tetrahydrocannabinol: NOT DETECTED

## 2014-02-23 LAB — CBC
HEMATOCRIT: 40.4 % (ref 36.0–46.0)
HEMOGLOBIN: 14 g/dL (ref 12.0–15.0)
MCH: 29.9 pg (ref 26.0–34.0)
MCHC: 34.7 g/dL (ref 30.0–36.0)
MCV: 86.1 fL (ref 78.0–100.0)
Platelets: 149 10*3/uL — ABNORMAL LOW (ref 150–400)
RBC: 4.69 MIL/uL (ref 3.87–5.11)
RDW: 13.1 % (ref 11.5–15.5)
WBC: 10.7 10*3/uL — ABNORMAL HIGH (ref 4.0–10.5)

## 2014-02-23 LAB — DIFFERENTIAL
BASOS PCT: 0 % (ref 0–1)
Basophils Absolute: 0 10*3/uL (ref 0.0–0.1)
EOS ABS: 0.1 10*3/uL (ref 0.0–0.7)
EOS PCT: 1 % (ref 0–5)
LYMPHS ABS: 2 10*3/uL (ref 0.7–4.0)
Lymphocytes Relative: 19 % (ref 12–46)
MONO ABS: 0.4 10*3/uL (ref 0.1–1.0)
MONOS PCT: 3 % (ref 3–12)
Neutro Abs: 8.2 10*3/uL — ABNORMAL HIGH (ref 1.7–7.7)
Neutrophils Relative %: 77 % (ref 43–77)

## 2014-02-23 LAB — COMPREHENSIVE METABOLIC PANEL
ALK PHOS: 95 U/L (ref 39–117)
ALT: 9 U/L (ref 0–35)
ANION GAP: 14 (ref 5–15)
AST: 19 U/L (ref 0–37)
Albumin: 3.8 g/dL (ref 3.5–5.2)
BUN: 8 mg/dL (ref 6–23)
CO2: 26 mEq/L (ref 19–32)
CREATININE: 0.61 mg/dL (ref 0.50–1.10)
Calcium: 9 mg/dL (ref 8.4–10.5)
Chloride: 100 mEq/L (ref 96–112)
GFR calc Af Amer: 89 mL/min — ABNORMAL LOW (ref >90)
GFR calc non Af Amer: 77 mL/min — ABNORMAL LOW (ref >90)
Glucose, Bld: 214 mg/dL — ABNORMAL HIGH (ref 70–99)
Potassium: 3.7 mEq/L (ref 3.7–5.3)
Sodium: 140 mEq/L (ref 137–147)
TOTAL PROTEIN: 7 g/dL (ref 6.0–8.3)
Total Bilirubin: 0.5 mg/dL (ref 0.3–1.2)

## 2014-02-23 LAB — GLUCOSE, CAPILLARY
Glucose-Capillary: 140 mg/dL — ABNORMAL HIGH (ref 70–99)
Glucose-Capillary: 127 mg/dL — ABNORMAL HIGH (ref 70–99)

## 2014-02-23 LAB — URINALYSIS, ROUTINE W REFLEX MICROSCOPIC
Bilirubin Urine: NEGATIVE
Glucose, UA: NEGATIVE mg/dL
Hgb urine dipstick: NEGATIVE
KETONES UR: NEGATIVE mg/dL
LEUKOCYTES UA: NEGATIVE
Nitrite: NEGATIVE
PH: 7 (ref 5.0–8.0)
Protein, ur: NEGATIVE mg/dL
Specific Gravity, Urine: 1.009 (ref 1.005–1.030)
Urobilinogen, UA: 0.2 mg/dL (ref 0.0–1.0)

## 2014-02-23 LAB — I-STAT CHEM 8, ED
BUN: 6 mg/dL (ref 6–23)
CALCIUM ION: 1.1 mmol/L — AB (ref 1.13–1.30)
Chloride: 99 mEq/L (ref 96–112)
Creatinine, Ser: 0.7 mg/dL (ref 0.50–1.10)
GLUCOSE: 218 mg/dL — AB (ref 70–99)
HEMATOCRIT: 45 % (ref 36.0–46.0)
HEMOGLOBIN: 15.3 g/dL — AB (ref 12.0–15.0)
Potassium: 3.5 mEq/L — ABNORMAL LOW (ref 3.7–5.3)
Sodium: 139 mEq/L (ref 137–147)
TCO2: 27 mmol/L (ref 0–100)

## 2014-02-23 LAB — ETHANOL

## 2014-02-23 LAB — PROTIME-INR
INR: 1.2 (ref 0.00–1.49)
Prothrombin Time: 15.2 seconds (ref 11.6–15.2)

## 2014-02-23 LAB — MRSA PCR SCREENING: MRSA by PCR: NEGATIVE

## 2014-02-23 LAB — APTT: aPTT: 28 seconds (ref 24–37)

## 2014-02-23 LAB — I-STAT TROPONIN, ED: TROPONIN I, POC: 0 ng/mL (ref 0.00–0.08)

## 2014-02-23 MED ORDER — PANTOPRAZOLE SODIUM 40 MG IV SOLR
40.0000 mg | Freq: Every day | INTRAVENOUS | Status: DC
  Administered 2014-02-23: 40 mg via INTRAVENOUS
  Filled 2014-02-23 (×3): qty 40

## 2014-02-23 MED ORDER — SODIUM CHLORIDE 0.9 % IV SOLN
INTRAVENOUS | Status: DC
  Administered 2014-02-23 – 2014-02-24 (×2): via INTRAVENOUS

## 2014-02-23 MED ORDER — CETYLPYRIDINIUM CHLORIDE 0.05 % MT LIQD
7.0000 mL | Freq: Two times a day (BID) | OROMUCOSAL | Status: DC
  Administered 2014-02-23 – 2014-02-24 (×2): 7 mL via OROMUCOSAL

## 2014-02-23 MED ORDER — ACETAMINOPHEN 650 MG RE SUPP
650.0000 mg | RECTAL | Status: DC | PRN
  Administered 2014-02-23: 650 mg via RECTAL
  Filled 2014-02-23: qty 1

## 2014-02-23 MED ORDER — PROTHROMBIN COMPLEX CONC HUMAN 500 UNITS IV KIT
3809.0000 [IU] | PACK | Status: AC
  Administered 2014-02-23: 3809 [IU] via INTRAVENOUS
  Filled 2014-02-23: qty 152

## 2014-02-23 MED ORDER — ONDANSETRON HCL 4 MG/2ML IJ SOLN
INTRAMUSCULAR | Status: AC
  Administered 2014-02-23: 4 mg
  Filled 2014-02-23: qty 2

## 2014-02-23 MED ORDER — LABETALOL HCL 5 MG/ML IV SOLN: 10.0000 mg | INTRAVENOUS | Status: DC | PRN

## 2014-02-23 MED ORDER — PROTHROMBIN COMPLEX CONC HUMAN 500 UNITS IV KIT
3386.0000 [IU] | PACK | Status: DC
  Filled 2014-02-23: qty 135

## 2014-02-23 MED ORDER — ACETAMINOPHEN 325 MG PO TABS: 650.0000 mg | ORAL_TABLET | ORAL | Status: DC | PRN

## 2014-02-23 MED ORDER — SENNOSIDES-DOCUSATE SODIUM 8.6-50 MG PO TABS
1.0000 | ORAL_TABLET | Freq: Two times a day (BID) | ORAL | Status: DC
  Filled 2014-02-23 (×4): qty 1

## 2014-02-23 MED ORDER — STROKE: EARLY STAGES OF RECOVERY BOOK
Freq: Once | Status: DC
  Filled 2014-02-23: qty 1

## 2014-02-23 NOTE — ED Notes (Signed)
Pt is resting, arouses to command.

## 2014-02-23 NOTE — Progress Notes (Signed)
Responded to level 2 trauma  page to provide emotional and spiritual support to patient and family. Patient was last known well at 4 am and became symptomatic around 9 am when she was noted to have trouble with comprehension and ability to communicate. EMS was summoned and brought to the ED where she was not significantly hypertensive, was alert and awake but frankly aphasic.  Patient son is an Market researcher for [employer] an is at bedside with two of Mrs. Eastburn daughters. Family requested that their pastor be called at [facility] [phone]).  Call was made and their pastor is in route to hospital.  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 Chaplain provided anticipatory grief support to family, ministry of presence,empathetic listening.  Promoted information sharing between staff and family. Remained with family until they reached a place ofcalm. Will follow as needed.  02/23/14 1100  Clinical Encounter Type  Visited With Patient;Family;Patient and family together;Health care provider  Visit Type Spiritual support;Code;ED;Trauma  Referral From Nurse  Spiritual Encounters  Spiritual Needs Prayer;Emotional  Stress Factors  Patient Stress Factors None identified  Family Stress Factors Exhausted;Health changes  Advance Directives (For Healthcare)  Does patient have an advance directive? No  Venida Jarvis, Chaplain,pager [phone]

## 2014-02-23 NOTE — ED Notes (Signed)
Pt resting comfortably. Family at bedside. In NAD. Respirations smooth and unlabored. Arouses to command. Speech remains incoherent. Noted to be moving arms and legs.

## 2014-02-23 NOTE — Consult Note (Addendum)
Referring Physician: ED    Chief Complaint: code stroke, aphasia  HPI:                                                                                                                                         Hayley [name] is an 80 y.o. female with a past medical history significant for HTN, atrial fibrillation on apixaban, recent TIA characterized by isolated aphasia, brought in via EMS due to acute onset aphasia. She is a DNR. Patient was last known well at 4 am and became symptomatic around 9 am when she was noted to have trouble with comprehension and ability to communicate. EMS was summoned and brought to the ED where she was not significantly hypertensive, was alert and awake but frankly aphasic. CT brain revealed an extensive hemorrhage throughout the left lateral ventricle with the hemorrhagic focus appearing to arise from the head of the caudate nucleus on the left. There is mild impression on the third ventricle with minimal midline shift of the third ventricle toward the right. No hydrocephalus noted. Date last known well: 02/23/14  Time last known well: 4 am tPA Given: no, ICH NIHSS: 18    Past Medical History  Diagnosis Date  . Chronic ITP (idiopathic thrombocytopenia)   . Chronic edema     BLE  . Thyroid disease     Hypothyroid  . Hypertension     MD took pt off of meds 04/2012  . Hypothyroidism   . Pelvic fracture October 2014    Recent fall  . Chronic atrial fibrillation     History of RVR June 2012    Past Surgical History  Procedure Laterality Date  . Shoulder hemi-arthroplasty  06/23/2012    Procedure: SHOULDER HEMI-ARTHROPLASTY;  Surgeon: Harvie Junior, MD;  Location: [facility] OR;  Service: Orthopedics;  Laterality: Right;  . Abdominal hysterectomy      1998  . Cesarean section      1969  . Bunionectomy      2000  . Orif elbow fracture  07/16/2012    Procedure: OPEN REDUCTION INTERNAL FIXATION (ORIF) ELBOW/OLECRANON FRACTURE;  Surgeon: Harvie Junior, MD;   Location: [facility] OR;  Service: Orthopedics;  Laterality: Right;  . Doppler echocardiography  12/2010     mild coccentric LVH but normal diastolic function of age. mild to moderate mtral regurg. moderately dilated right atrium and left atrium.   . Doppler echocardiography    . Event monitor  12/16/2010-01/10/2011    afib c-occ rvr    Family History  Problem Relation Age of Onset  . Stomach cancer Mother   . Diabetes Mother   . Heart attack Father   . Leukemia Sister   . Stroke Brother   . Stroke Sister    Social History:  reports that she has never smoked. She does not have any smokeless tobacco history on file. She reports that  she does not drink alcohol or use illicit drugs.  Allergies:  Allergies  Allergen Reactions  . Sulfa Antibiotics Hives  . Ultram [Tramadol] Other (See Comments)    Hallucinations.    Medications:                                                                                                                           I have reviewed the patient's current medications.  ROS: unable to obtain due to mental status                                                                                                                History obtained from chart review   Physical exam: pleasant female in no apparent distress. Blood pressure 142/90, pulse 97, temperature 98.8 F (37.1 C), temperature source Axillary, resp. rate 17, weight 69.4 kg (153 lb), SpO2 95.00%. Head: normocephalic. Neck: supple, no bruits, no JVD. Cardiac: no murmurs. Lungs: clear. Abdomen: soft, no tender, no mass. Extremities: no edema. Neurologic Examination:                                                                                                      General: Mental Status: Alert and awake but with expressive greater than expressive aphasia. Cranial Nerves: II: Discs flat bilaterally; Visual fields grossly normal, pupils equal, round, reactive to light and accommodation III,IV, VI:  ptosis not present, extra-ocular motions intact bilaterally V,VII: smile symmetric, facial light touch sensation normal bilaterally VIII: hearing normal bilaterally IX,X: gag reflex present XI: bilateral shoulder shrug XII: midline tongue extension without atrophy or fasciculations Motor: Seems to be able to move all limbs symmetrically. Sensory: no tested Deep Tendon Reflexes:  1+ all over Plantars: Right: downgoing   Left: downgoing Cerebellar: Can not be tested due to patient's inability to follow commands. Gait:  Unable to test.    Results for orders placed during the hospital encounter of 02/23/14 (from the past 48 hour(s))  PROTIME-INR     Status: None   Collection Time    02/23/14 10:23 AM  Result Value Ref Range   Prothrombin Time 15.2  11.6 - 15.2 seconds   INR 1.20  0.00 - 1.49  APTT     Status: None   Collection Time    02/23/14 10:23 AM      Result Value Ref Range   aPTT 28  24 - 37 seconds  CBC     Status: Abnormal   Collection Time    02/23/14 10:23 AM      Result Value Ref Range   WBC 10.7 (*) 4.0 - 10.5 K/uL   RBC 4.69  3.87 - 5.11 MIL/uL   Hemoglobin 14.0  12.0 - 15.0 g/dL   HCT 04.5  40.9 - 81.1 %   MCV 86.1  78.0 - 100.0 fL   MCH 29.9  26.0 - 34.0 pg   MCHC 34.7  30.0 - 36.0 g/dL   RDW 91.4  78.2 - 95.6 %   Platelets 149 (*) 150 - 400 K/uL  DIFFERENTIAL     Status: Abnormal   Collection Time    02/23/14 10:23 AM      Result Value Ref Range   Neutrophils Relative % 77  43 - 77 %   Neutro Abs 8.2 (*) 1.7 - 7.7 K/uL   Lymphocytes Relative 19  12 - 46 %   Lymphs Abs 2.0  0.7 - 4.0 K/uL   Monocytes Relative 3  3 - 12 %   Monocytes Absolute 0.4  0.1 - 1.0 K/uL   Eosinophils Relative 1  0 - 5 %   Eosinophils Absolute 0.1  0.0 - 0.7 K/uL   Basophils Relative 0  0 - 1 %   Basophils Absolute 0.0  0.0 - 0.1 K/uL  I-STAT TROPOININ, ED     Status: None   Collection Time    02/23/14 10:30 AM      Result Value Ref Range   Troponin i, poc 0.00   0.00 - 0.08 ng/mL   Comment 3            Comment: Due to the release kinetics of cTnI,     a negative result within the first hours     of the onset of symptoms does not rule out     myocardial infarction with certainty.     If myocardial infarction is still suspected,     repeat the test at appropriate intervals.  I-STAT CHEM 8, ED     Status: Abnormal   Collection Time    02/23/14 10:32 AM      Result Value Ref Range   Sodium 139  137 - 147 mEq/L   Potassium 3.5 (*) 3.7 - 5.3 mEq/L   Chloride 99  96 - 112 mEq/L   BUN 6  6 - 23 mg/dL   Creatinine, Ser 2.13  0.50 - 1.10 mg/dL   Glucose, Bld 086 (*) 70 - 99 mg/dL   Calcium, Ion 5.78 (*) 1.13 - 1.30 mmol/L   TCO2 27  0 - 100 mmol/L   Hemoglobin 15.3 (*) 12.0 - 15.0 g/dL   HCT 46.9  62.9 - 52.8 %   Ct Head Wo Contrast  02/23/2014   CLINICAL DATA:  aphasia  EXAM: CT HEAD WITHOUT CONTRAST  TECHNIQUE: Contiguous axial images were obtained from the base of the skull through the vertex without intravenous contrast.  COMPARISON:  Head CT January 17, 2014 and brain MR January 18, 2014  FINDINGS: There is extensive hemorrhage which arises from the head of the caudate  nucleus on the left with extensive intraventricular extension. Hemorrhage is seen throughout the left lateral ventricle with dilatation of the left lateral ventricle due to this extensive hemorrhage. There is impression on the third ventricle anteriorly and superiorly with minimal midline shift of the third ventricle.  There is underlying moderate generalized atrophy. There is no subdural or epidural fluid. There is extensive small vessel disease in the centra semiovale bilaterally, stable.  The bony calvarium appears intact.  The mastoid air cells are clear.  IMPRESSION: Extensive hemorrhage throughout the left lateral ventricle with the hemorrhagic focus appearing to arise from the head of the caudate nucleus on the left. There is mild impression on the third ventricle with minimal midline  shift of the third ventricle toward the right. There is underlying atrophy with extensive supratentorial small vessel disease.  Critical Value/emergent results were called by telephone at the time of interpretation on 02/23/2014 at 10:42 am to Dr. Benjiman Core , who verbally acknowledged these results.   Electronically Signed   By: Bretta Bang M.D.   On: 02/23/2014 10:43    Assessment: 80 y.o. female with atrial fibrillation on apixaban, brought in with acute onset aphasia. CT brain demonstrated an extensive hemorrhage throughout the left lateral ventricle with the hemorrhagic focus appearing to arise from the head of the caudate nucleus on the left. There is mild impression on the third ventricle with minimal midline shift of the third ventricle toward the right. No hydrocephalus noted. This is quite likely apixaban-related ICH. Recommend: Admit to NICU. Stop Apixaban and attempt to reverse anticoagulation as per institutional protocol. No frank hydrocephalus at this moment.  BP is not currently an issue, but maintain SBP<140.  Follow ICH management guidelines. Repeat CT brain in am. Stroke team will follow up in am.  Wyatt Portela, MD [facility] [phone]  02/23/2014, 10:53 AM

## 2014-02-23 NOTE — ED Notes (Signed)
Pt comes from home where at 0400 she got up to bathroom witnessed by aid and was normal. At 0930, right sided neglect and aphasia noted. Has hx of recent TIA. Not following commands. BP 142/90, HR 90, CBG 188. Has hx afib

## 2014-02-23 NOTE — Code Documentation (Signed)
80yo arriving to [facility] at 0924 via [facility].  Patient was LKW at 0400 when her caregiver assisted her to the bathroom.  She was found to have aphasia at 0930.  Patient with a h/o TIA one month ago with resolving symptoms.  Patient has a h/o dementia and takes Eliquis for atrial fibrillation.  Patient cleared at the bridge by Dr. Rubin Payor.  Patient taken to CT which showed left IVH.  Patient back to Trauma C.  Patient vomited x1.  Zofran IVP given.  SBP in the 140s.  NIHSS 18, see documentation for details and code stroke times.  Reversal protocol ordered, NS consult, patient to be admitted to neuro ICU.  Bedside handoff with ED RN Maralyn Sago.

## 2014-02-23 NOTE — Plan of Care (Signed)
Problem: Consults Goal: RH STROKE PATIENT EDUCATION See Patient Education module for education specifics  Outcome: Progressing Stroke mapping booklet ordered Goal: Nutrition Consult-if indicated Outcome: Not Met (add Reason) Patient npo, awaiting speech evaluation

## 2014-02-23 NOTE — ED Provider Notes (Signed)
CSN: [id number]     Arrival date & time 02/23/14  12 History   First MD Initiated Contact with Patient 02/23/14 1035     Chief Complaint  Patient presents with  . Code Stroke   level V caveat due 2 altered mental status  @EDPCLEARED @ (Consider location/radiation/quality/duration/timing/severity/associated sxs/prior Treatment) The history is provided by the patient and the EMS personnel.   patient presented as a code stroke. Immediately met by myself upon arrival. Last normal at 4 AM. Has some aphasia. Right-sided neglect. She is on anticoagulation for atrial for ablation and a previous stroke.  Past Medical History  Diagnosis Date  . Chronic ITP (idiopathic thrombocytopenia)   . Chronic edema     BLE  . Thyroid disease     Hypothyroid  . Hypertension     MD took pt off of meds 04/2012  . Hypothyroidism   . Pelvic fracture October 2014    Recent fall  . Chronic atrial fibrillation     History of RVR June 2012   Past Surgical History  Procedure Laterality Date  . Shoulder hemi-arthroplasty  06/23/2012    Procedure: SHOULDER HEMI-ARTHROPLASTY;  Surgeon: Alta Corning, MD;  Location: [place];  Service: Orthopedics;  Laterality: Right;  . Abdominal hysterectomy      1998  . Cesarean section      1969  . Bunionectomy      2000  . Orif elbow fracture  07/16/2012    Procedure: OPEN REDUCTION INTERNAL FIXATION (ORIF) ELBOW/OLECRANON FRACTURE;  Surgeon: Alta Corning, MD;  Location: [facility];  Service: Orthopedics;  Laterality: Right;  . Doppler echocardiography  12/2010     mild coccentric LVH but normal diastolic function of age. mild to moderate mtral regurg. moderately dilated right atrium and left atrium.   . Doppler echocardiography    . Event monitor  12/16/2010-01/10/2011    afib c-occ rvr   Family History  Problem Relation Age of Onset  . Stomach cancer Mother   . Diabetes Mother   . Heart attack Father   . Leukemia Sister   . Stroke Brother   . Stroke Sister    History   Substance Use Topics  . Smoking status: Never Smoker   . Smokeless tobacco: Not on file  . Alcohol Use: No   OB History   Grav Para Term Preterm Abortions TAB SAB Ect Mult Living                 Review of Systems  Unable to perform ROS     Allergies  Sulfa antibiotics and Ultram  Home Medications   Prior to Admission medications   Medication Sig Start Date End Date Taking? Authorizing Provider  ALPRAZolam (XANAX) 0.25 MG tablet Take 0.25 mg by mouth 3 (three) times daily as needed for anxiety.    Yes Historical Provider, MD  apixaban (ELIQUIS) 5 MG TABS tablet Take 2.5 mg by mouth 2 (two) times daily.   Yes Historical Provider, MD  Biotin 5000 MCG TABS Take 5,000 mcg by mouth daily.    Yes Historical Provider, MD  calcium-vitamin D (OSCAL WITH D) 500-200 MG-UNIT per tablet Take 1 tablet by mouth daily.   Yes Historical Provider, MD  diltiazem (CARDIZEM CD) 360 MG 24 hr capsule Take 360 mg by mouth daily.   Yes Historical Provider, MD  Echinacea 400 MG CAPS Take 400 mg by mouth daily.    Yes Historical Provider, MD  furosemide (LASIX) 20 MG tablet Take 20-40  mg by mouth daily.    Yes Historical Provider, MD  levothyroxine (SYNTHROID, LEVOTHROID) 100 MCG tablet Take 100 mcg by mouth daily.    Yes Historical Provider, MD  metoprolol tartrate (LOPRESSOR) 25 MG tablet Take 1 tablet (25 mg total) by mouth 2 (two) times daily. 11/12/13  Yes Leonie Man, MD  Multiple Vitamin (MULTIVITAMIN WITH MINERALS) TABS Take 1 tablet by mouth daily.   Yes Historical Provider, MD  potassium chloride SA (K-DUR,KLOR-CON) 20 MEQ tablet Take 10-20 mEq by mouth daily.    Yes Historical Provider, MD  sertraline (ZOLOFT) 100 MG tablet Take 100 mg by mouth daily.    Yes Historical Provider, MD  simvastatin (ZOCOR) 40 MG tablet Take 1 tablet (40 mg total) by mouth every evening. 09/23/13  Yes Leonie Man, MD  thiamine (VITAMIN B-1) 100 MG tablet Take 100 mg by mouth daily.   Yes Historical Provider, MD   vitamin C (ASCORBIC ACID) 500 MG tablet Take 500 mg by mouth daily.   Yes Historical Provider, MD  vitamin E 1000 UNIT capsule Take 1,000 Units by mouth daily.   Yes Historical Provider, MD   BP 121/69  Pulse 88  Temp(Src) 98.8 F (37.1 C) (Axillary)  Resp 18  Wt 153 lb (69.4 kg)  SpO2 99% Physical Exam  Constitutional: She appears well-developed and well-nourished.  Eyes: Pupils are equal, round, and reactive to light.  Neck: Neck supple.  Cardiovascular: Normal rate.   Pulmonary/Chest: Effort normal and breath sounds normal.  Neurological: She is alert.  Patient is alert. She will well extremities. Right-sided neglect. She does look to voice, however does not seem to follow commands. She is speaking, but somewhat incoherently. Complete NIH score done by stroke team  Skin: Skin is warm.    ED Course  Procedures (including critical care time) Labs Review Labs Reviewed  CBC - Abnormal; Notable for the following:    WBC 10.7 (*)    Platelets 149 (*)    All other components within normal limits  DIFFERENTIAL - Abnormal; Notable for the following:    Neutro Abs 8.2 (*)    All other components within normal limits  COMPREHENSIVE METABOLIC PANEL - Abnormal; Notable for the following:    Glucose, Bld 214 (*)    GFR calc non Af Amer 77 (*)    GFR calc Af Amer 89 (*)    All other components within normal limits  I-STAT CHEM 8, ED - Abnormal; Notable for the following:    Potassium 3.5 (*)    Glucose, Bld 218 (*)    Calcium, Ion 1.10 (*)    Hemoglobin 15.3 (*)    All other components within normal limits  ETHANOL  PROTIME-INR  APTT  URINE RAPID DRUG SCREEN (HOSP PERFORMED)  URINALYSIS, ROUTINE W REFLEX MICROSCOPIC  I-STAT TROPOININ, ED  I-STAT TROPOININ, ED    Imaging Review Ct Head Wo Contrast  02/23/2014   CLINICAL DATA:  aphasia  EXAM: CT HEAD WITHOUT CONTRAST  TECHNIQUE: Contiguous axial images were obtained from the base of the skull through the vertex without  intravenous contrast.  COMPARISON:  Head CT January 17, 2014 and brain MR January 18, 2014  FINDINGS: There is extensive hemorrhage which arises from the head of the caudate nucleus on the left with extensive intraventricular extension. Hemorrhage is seen throughout the left lateral ventricle with dilatation of the left lateral ventricle due to this extensive hemorrhage. There is impression on the third ventricle anteriorly and superiorly with  minimal midline shift of the third ventricle.  There is underlying moderate generalized atrophy. There is no subdural or epidural fluid. There is extensive small vessel disease in the centra semiovale bilaterally, stable.  The bony calvarium appears intact.  The mastoid air cells are clear.  IMPRESSION: Extensive hemorrhage throughout the left lateral ventricle with the hemorrhagic focus appearing to arise from the head of the caudate nucleus on the left. There is mild impression on the third ventricle with minimal midline shift of the third ventricle toward the right. There is underlying atrophy with extensive supratentorial small vessel disease.  Critical Value/emergent results were called by telephone at the time of interpretation on 02/23/2014 at 10:42 am to Dr. Davonna Belling , who verbally acknowledged these results.   Electronically Signed   By: Lowella Grip M.D.   On: 02/23/2014 10:43     EKG Interpretation   Date/Time:  Tuesday February 23 2014 10:46:55 EDT Ventricular Rate:  90 PR Interval:    QRS Duration: 96 QT Interval:  381 QTC Calculation: 466 R Axis:   32 Text Interpretation:  Atrial flutter Low voltage, precordial leads  Borderline repolarization abnormality Confirmed by Alvino Chapel  MD, Ovid Curd  [phone]) on 02/23/2014 11:55:51 AM      MDM   Final diagnoses:  Hemorrhagic stroke    Patient came in as a code stroke. Found to have intracranial hemorrhage with ventricular involvement. Some midline shift. She is already on anticoagulation.  Patient be emergently reversed. Discussed with patient's son. Discussed with neurosurgery. Patient is a DO NOT RESUSCITATE or not want extraordinary measures. Will give hypertonic saline. Admitted to neuro ICU by neurology.  CRITICAL CARE Performed by: Mackie Pai Total critical care time: 30 Critical care time was exclusive of separately billable procedures and treating other patients. Critical care was necessary to treat or prevent imminent or life-threatening deterioration. Critical care was time spent personally by me on the following activities: development of treatment plan with patient and/or surrogate as well as nursing, discussions with consultants, evaluation of patient's response to treatment, examination of patient, obtaining history from patient or surrogate, ordering and performing treatments and interventions, ordering and review of laboratory studies, ordering and review of radiographic studies, pulse oximetry and re-evaluation of patient's condition.     Jasper Riling. Alvino Chapel, MD 02/23/14 1157

## 2014-02-24 ENCOUNTER — Inpatient Hospital Stay (HOSPITAL_COMMUNITY): Payer: Medicare Other

## 2014-02-24 ENCOUNTER — Encounter (HOSPITAL_COMMUNITY): Payer: Self-pay | Admitting: Radiology

## 2014-02-24 LAB — COMPREHENSIVE METABOLIC PANEL
ALT: 8 U/L (ref 0–35)
ANION GAP: 13 (ref 5–15)
AST: 17 U/L (ref 0–37)
Albumin: 3.2 g/dL — ABNORMAL LOW (ref 3.5–5.2)
Alkaline Phosphatase: 78 U/L (ref 39–117)
BUN: 7 mg/dL (ref 6–23)
CO2: 28 mEq/L (ref 19–32)
Calcium: 8.8 mg/dL (ref 8.4–10.5)
Chloride: 101 mEq/L (ref 96–112)
Creatinine, Ser: 0.57 mg/dL (ref 0.50–1.10)
GFR calc Af Amer: >90 mL/min (ref >90)
GFR calc non Af Amer: 79 mL/min — ABNORMAL LOW (ref >90)
Glucose, Bld: 135 mg/dL — ABNORMAL HIGH (ref 70–99)
Potassium: 3.7 mEq/L (ref 3.7–5.3)
Sodium: 142 mEq/L (ref 137–147)
TOTAL PROTEIN: 6.3 g/dL (ref 6.0–8.3)
Total Bilirubin: 0.6 mg/dL (ref 0.3–1.2)

## 2014-02-24 LAB — PROTIME-INR
INR: 1.14 (ref 0.00–1.49)
Prothrombin Time: 14.6 seconds (ref 11.6–15.2)

## 2014-02-24 LAB — GLUCOSE, CAPILLARY: Glucose-Capillary: 151 mg/dL — ABNORMAL HIGH (ref 70–99)

## 2014-02-24 LAB — CBC
HEMATOCRIT: 37.8 % (ref 36.0–46.0)
HEMOGLOBIN: 12.6 g/dL (ref 12.0–15.0)
MCH: 29.7 pg (ref 26.0–34.0)
MCHC: 33.3 g/dL (ref 30.0–36.0)
MCV: 89.2 fL (ref 78.0–100.0)
Platelets: 148 10*3/uL — ABNORMAL LOW (ref 150–400)
RBC: 4.24 MIL/uL (ref 3.87–5.11)
RDW: 13.4 % (ref 11.5–15.5)
WBC: 11.5 10*3/uL — ABNORMAL HIGH (ref 4.0–10.5)

## 2014-02-24 LAB — APTT: APTT: 30 s (ref 24–37)

## 2014-02-24 MED ORDER — WHITE PETROLATUM GEL
Status: AC
  Administered 2014-02-24: 0.2
  Filled 2014-02-24: qty 5

## 2014-02-24 MED ORDER — SCOPOLAMINE 1 MG/3DAYS TD PT72
1.0000 | MEDICATED_PATCH | TRANSDERMAL | Status: DC
  Administered 2014-02-24: 1.5 mg via TRANSDERMAL
  Filled 2014-02-24: qty 1

## 2014-02-24 MED ORDER — MORPHINE SULFATE 2 MG/ML IJ SOLN
1.0000 mg | INTRAMUSCULAR | Status: DC | PRN
  Administered 2014-02-24 – 2014-02-26 (×17): 1 mg via INTRAVENOUS
  Filled 2014-02-24 (×17): qty 1

## 2014-02-24 MED ORDER — ACETAMINOPHEN 650 MG RE SUPP: 650.0000 mg | RECTAL | Status: DC | PRN

## 2014-02-24 MED ORDER — LORAZEPAM 2 MG/ML PO CONC: 1.0000 mg | ORAL | Status: DC | PRN

## 2014-02-24 MED ORDER — DILTIAZEM HCL 25 MG/5ML IV SOLN
5.0000 mg | Freq: Once | INTRAVENOUS | Status: AC
  Administered 2014-02-24: 5 mg via INTRAVENOUS
  Filled 2014-02-24: qty 5

## 2014-02-24 MED ORDER — DILTIAZEM HCL 100 MG IV SOLR
5.0000 mg/h | INTRAVENOUS | Status: DC
  Administered 2014-02-24: 5 mg/h via INTRAVENOUS
  Filled 2014-02-24: qty 100

## 2014-02-24 MED ORDER — DIPHENHYDRAMINE HCL 50 MG/ML IJ SOLN: 12.5000 mg | INTRAMUSCULAR | Status: DC | PRN

## 2014-02-24 MED ORDER — HALOPERIDOL LACTATE 2 MG/ML PO CONC
0.5000 mg | ORAL | Status: DC | PRN
  Filled 2014-02-24: qty 0.3

## 2014-02-24 MED ORDER — ONDANSETRON HCL 4 MG PO TABS: 4.0000 mg | ORAL_TABLET | Freq: Four times a day (QID) | ORAL | Status: DC | PRN

## 2014-02-24 MED ORDER — DILTIAZEM HCL 25 MG/5ML IV SOLN
5.0000 mg | Freq: Once | INTRAVENOUS | Status: AC
  Administered 2014-02-24: 5 mg via INTRAVENOUS

## 2014-02-24 MED ORDER — ONDANSETRON HCL 4 MG/2ML IJ SOLN: 4.0000 mg | Freq: Four times a day (QID) | INTRAMUSCULAR | Status: DC | PRN

## 2014-02-24 MED ORDER — MORPHINE SULFATE 2 MG/ML IJ SOLN
1.0000 mg | INTRAMUSCULAR | Status: DC | PRN
  Administered 2014-02-24: 1 mg via INTRAVENOUS
  Filled 2014-02-24: qty 1

## 2014-02-24 MED ORDER — LORAZEPAM 2 MG/ML IJ SOLN
0.5000 mg | INTRAMUSCULAR | Status: DC | PRN
  Administered 2014-02-26: 0.5 mg via INTRAVENOUS
  Filled 2014-02-24: qty 1

## 2014-02-24 NOTE — Progress Notes (Signed)
OT Cancellation Note  Patient Details Name: Hayley [name] MRN: [id number] DOB: Apr 05, 1921   Cancelled Treatment:    Reason Eval/Treat Not Completed: Patient not medically ready;Other (comment) Pt on bedrest [facility], OTR/L  [phone] 02/24/2014 02/24/2014, 10:58 AM

## 2014-02-24 NOTE — Progress Notes (Signed)
Courtesy Visit. Pt in good spirits but c Sig Expressive Aphasia. Coumadin changed to Eliquis @ 1 month ago in association c TIA per Neuro recs to decrease ICH. It unfortunately happened anyway. Pt c chronic Afib and Afib RVR on Cardizem gtt. Dr Herbie Baltimore is her cardiologist If any formal help needed - I am available for consultation. Repeat CCT - Extensive intraventricular hemorrhage, 9 mm LEFT to RIGHT midline shift, no convincing evidence of hydrocephalus. More conspicuous trace subarachnoid blood within RIGHT frontotemporal sulci. Severe white matter changes suggest chronic small vessel ischemic disease.

## 2014-02-24 NOTE — Progress Notes (Signed)
5 mg Cardizem IVP given x2. Brought HR to 115, but was effective for a short period of time. Dr Thad Ranger notified. Cardizem drip ordered and started at this time.

## 2014-02-24 NOTE — Progress Notes (Signed)
5 mg cardizem adminsitered

## 2014-02-24 NOTE — Progress Notes (Signed)
SLP Cancellation Note  Patient Details Name: ROSSI SILVESTRO MRN: [id number] DOB: June 03, 1921   Cancelled evaluation:     Spoke with pt's son; family is awaiting meeting with Palliative Medicine.  Will defer evaluation until GOC have been identified.        Blenda Mounts Laurice 02/24/2014, 11:15 AM

## 2014-02-24 NOTE — Progress Notes (Signed)
Checked in with family, who were experiencing grief but seemed comfortable with the decision for palliative care. Offered prayer around pt with family and encouraged family to call for chaplain if needed.    02/24/14 1600  Clinical Encounter Type  Visited With Patient and family together  Visit Type Spiritual support  Referral From Nurse  Spiritual Encounters  Spiritual Needs Prayer  Stress Factors  Family Stress Factors Loss  Wille Glaser 02/24/2014 4:44 PM

## 2014-02-24 NOTE — Progress Notes (Signed)
Patients o2 sats dropped, family at bedside and health care power of attorney does not want oxygen applied to patient.Documentation is in front of the chart. MD aware. Will continue to monitor.

## 2014-02-24 NOTE — Consult Note (Signed)
Patient Hayley [name]      DOB: 11/09/1920      SPQ:[id number]     Consult Note from the Palliative Medicine Team at [facility] Requested by: Dr. Erlinda [name]    PCP: Hayley Reel, MD Reason for Consultation: [facility]    Phone Number:[phone] Symptom management Assessment of patients Current state: 80 yr old white female with known afib on anticoagulation, hypertension and chronic pain . Was admitted for aphasia and inability to follow commands. She was found to have Hayley significant ICH with SAH component. 9 mm shift.   I met with Son Hayley [name] and Hayley [name] at bedside along with the patient Hayley [name].  Hayley [name] and Joellen [name] are Hayley [name].  Family with reasonable understanding that Hayley [name] has told them not to prolong her live. Advanced directive written in plan english states treat her symptoms even if it will promote her death.  Family desires full comfort.   Goals of Care: 1.  Code Status: DNR, Comofort care only   2. Scope of Treatment: Aggressively treat symptoms at the end of life.  No further labs or blood draws.    4. Disposition: to be determined.  Patient may not survive the night.   3. Symptom Management:   1. Anxiety/Agitation: prn ativan  2. Pain:prn morphine .  Low threshold for opiate infusion if symptoms worsen 3. Bowel Regimen: Monitor 4. Fever: prn tylenol 5. Terminal Secretions: anticipatory treatment with scopolamine and can atropine for breakthrough if needed.  4. Psychosocial: Family describes Hayley [name] as Hayley fiercely independent  Opinionated woman with many loves including her family.  She worked until she was 48 and recent told her son she was  Bored and wanted to go back to work.  5. Spiritual: Hayley [name] faith.        Patient Documents Completed or Given: Document Given Completed  Advanced Directives Pkt    MOST    DNR    Gone from My Sight    Hard Choices      Brief HPI: 80 yr old female with history of afib on coumadin was found to be aphasic and having  difficulty with following commands. Patient was evaluated for stroke and found to have extensive involvement of the right brain with [facility] and Ventricular involvement.  We were asked to assist with symptom management and goals of care.   ROS: unable to assess due to obtundation.    PMH:  Past Medical History  Diagnosis Date  . Chronic ITP (idiopathic thrombocytopenia)   . Chronic edema     BLE  . Thyroid disease     Hypothyroid  . Hypertension     MD took pt off of meds 04/2012  . Hypothyroidism   . Pelvic fracture October 2014    Recent fall  . Chronic atrial fibrillation     History of RVR June 2012     PSH: Past Surgical History  Procedure Laterality Date  . Shoulder hemi-arthroplasty  06/23/2012    Procedure: SHOULDER HEMI-ARTHROPLASTY;  Surgeon: Alta Corning, MD;  Location: [facility];  Service: Orthopedics;  Laterality: Right;  . Abdominal hysterectomy      1998  . Cesarean section      1969  . Bunionectomy      2000  . Orif elbow fracture  07/16/2012    Procedure: OPEN REDUCTION INTERNAL FIXATION (ORIF) ELBOW/OLECRANON FRACTURE;  Surgeon: Alta Corning, MD;  Location: [facility];  Service: Orthopedics;  Laterality: Right;  . Doppler echocardiography  12/2010  mild coccentric LVH but normal diastolic function of age. mild to moderate mtral regurg. moderately dilated right atrium and left atrium.   . Doppler echocardiography    . Event monitor  12/16/2010-01/10/2011    afib c-occ rvr   I have reviewed the [place] and SH and  If appropriate update it with new information. Allergies  Allergen Reactions  . Sulfa Antibiotics Hives  . Ultram [Tramadol] Other (See Comments)    Hallucinations.   Scheduled Meds: . antiseptic oral rinse  7 mL Mouth Rinse BID  . scopolamine  1 patch Transdermal Q72H   Continuous Infusions:  PRN Meds:.acetaminophen, haloperidol, LORazepam, morphine injection, ondansetron (ZOFRAN) IV, ondansetron    BP 131/74  Pulse 103  Temp(Src) 98.4 F (36.9  C) (Oral)  Resp 20  Ht _0  (1.6 m)  Wt 69.4 kg (153 lb)  BMI 27.11 kg/m2  SpO2 99%   PPS: 10%   Intake/Output Summary (Last 24 hours) at 02/24/14 1803 Last data filed at 02/24/14 1500  Gross per 24 hour  Intake    575 ml  Output      2 ml  Net    573 ml    Physical Exam:  General: will open eyes slightly but briefly to tactile and verbal stimuli, generally obtunded, some brow furrowing HEENT:  Pupils equal and round on brief exam, mm dry Chest:   Decreased but clear  CVS: irregular, S1, s2 Abdomen:obese, not distended or tender Ext: warm, can't full assess motor function due to obtunded state Neuro:obtunded, intermittent moaning  Labs: CBC    Component Value Date/Time   WBC 11.5* 02/24/2014 0239   WBC 11.4* 04/16/2013 1421   RBC 4.24 02/24/2014 0239   RBC 4.34 04/16/2013 1421   HGB 12.6 02/24/2014 0239   HGB 12.7 04/16/2013 1421   HCT 37.8 02/24/2014 0239   HCT 38.8 04/16/2013 1421   PLT 148* 02/24/2014 0239   PLT 184 04/16/2013 1421   MCV 89.2 02/24/2014 0239   MCV 89.4 04/16/2013 1421   MCH 29.7 02/24/2014 0239   MCH 29.2 04/16/2013 1421   MCHC 33.3 02/24/2014 0239   MCHC 32.7 04/16/2013 1421   RDW 13.4 02/24/2014 0239   RDW 13.8 04/16/2013 1421   LYMPHSABS 2.0 02/23/2014 1023   LYMPHSABS 2.5 04/16/2013 1421   MONOABS 0.4 02/23/2014 1023   MONOABS 0.8 04/16/2013 1421   EOSABS 0.1 02/23/2014 1023   EOSABS 0.3 04/16/2013 1421   BASOSABS 0.0 02/23/2014 1023   BASOSABS 0.0 04/16/2013 1421      CMP     Component Value Date/Time   NA 142 02/24/2014 0239   K 3.7 02/24/2014 0239   CL 101 02/24/2014 0239   CO2 28 02/24/2014 0239   GLUCOSE 135* 02/24/2014 0239   BUN 7 02/24/2014 0239   CREATININE 0.57 02/24/2014 0239   CALCIUM 8.8 02/24/2014 0239   PROT 6.3 02/24/2014 0239   ALBUMIN 3.2* 02/24/2014 0239   AST 17 02/24/2014 0239   ALT 8 02/24/2014 0239   ALKPHOS 78 02/24/2014 0239   BILITOT 0.6 02/24/2014 0239   GFRNONAA 79* 02/24/2014 0239   GFRAA >90 02/24/2014 0239    CT scan of  the Head Reviewed/Impressions: 9/15 Extensive hemorrhage throughout the left lateral ventricle with the  hemorrhagic focus appearing to arise from the head of the caudate  nucleus on the left. There is mild impression on the third ventricle  with minimal midline shift of the third ventricle toward the right.  There is underlying  atrophy with extensive supratentorial small  vessel disease.  9/16 Extensive intraventricular hemorrhage, 9 mm LEFT to RIGHT midline  shift, no convincing evidence of hydrocephalus.  More conspicuous trace subarachnoid blood within RIGHT  frontotemporal sulci.  Severe white matter changes suggest chronic small vessel ischemic  disease.      Time In Time Out Total Time Spent with Patient Total Overall Time  520 pm 655 pm 35 min 35 min    Greater than 50%  of this time was spent counseling and coordinating care related to the above assessment and plan.  Bryonna Sundby L. Lovena Le, MD MBA The Palliative Medicine Team at [facility] Phone: [phone] Pager: [phone] ( Use team phone after hours)

## 2014-02-24 NOTE — Progress Notes (Signed)
Utilization review completed.  

## 2014-02-24 NOTE — Consult Note (Signed)
Patient CC:Hayley [name]      DOB: 1920/07/13      VQQ:[id number]  Summary of Goals of Care; full note to follow:  Met with patient's two son's Abe People is her POA [phone]), Grand daughter Joellen Jersey present, other son Mikki Santee.  Two daughters just arrived and indicate that they are ok and have no questions or need to talk.  Patient does respond to loud voice and light touch by furrowing her brow.  Royetta Crochet and Katie tell me that [name] was a strong willed woman who worked till she was 40 at [place] at [employer] and when she retired she told Mikki Santee she was bored and that she wanted to go back to work.  She mad a plain language living will at the time of her husbands illness that indicated that she desired full comfort care in the event that she would be facing a life limiting illness, even if comfort hastened her death. Family and I reviewed her scans and I provided a paper copy to them at their request.  Goals at this time are full comfort nothing to prolong only comfort.  Oxygen has been removed as it was bothering her. IV fluids and Cardizem stopped.  Will reevaluate in the am for possible GIP status if she survives the night.   Recommend:  1.  DNR  2.  Move to general medical floor at the discretion of Dr. Erlinda Hong  3.  Pain and dyspnea:  Agree with morphine will change frequency to q 1 hour and if needed I have discussed the use of a continuous infusion if needed.  4.  Terminal secretions: anticipatory, starting with some bubbles at the lip level.  Start Scopolamine patch  Please call if symptoms change and are not managed. [phone].    Total time 520 pm- 655 pm  Kodi Guerrera L. Lovena Le, MD MBA The Palliative Medicine Team at [facility] Phone: [phone] Pager: [phone] ( Use team phone after hours)

## 2014-02-24 NOTE — Progress Notes (Signed)
STROKE TEAM PROGRESS NOTE   HISTORY Hayley [name] is an 80 y.o. female with a past medical history significant for HTN, atrial fibrillation on apixaban, recent TIA characterized by isolated aphasia, brought in via EMS due to acute onset aphasia. She is a DNR.  Patient was last known well at 4 am and became symptomatic around 9 am when she was noted to have trouble with comprehension and ability to communicate. EMS was summoned and brought to the ED where she was not significantly hypertensive, was alert and awake but frankly aphasic.  CT brain revealed an extensive hemorrhage throughout the left lateral ventricle with the hemorrhagic focus appearing to arise from the head of the caudate nucleus on the left. There is mild impression on the third ventricle with minimal midline shift of the third ventricle toward the right. No hydrocephalus noted.  Date last known well: 02/23/14  Time last known well: 4 am  tPA Given: no, ICH  NIHSS: 18   SUBJECTIVE (INTERVAL HISTORY) Her daughters and sisters are at the bedside.  Overall  her condition is unchanged. She was awake on stimulation, right side weakness and global aphasic. Had long discussion with families and they request comfort care as pt has expressed her wishes not want to artificially prolong life if no meaningful neurological recovery. Will honer her wishes   OBJECTIVE Temp:  [98.4 F (36.9 C)-99.8 F (37.7 C)] 98.4 F (36.9 C) (09/16 0800) Pulse Rate:  [102-134] 103 (09/16 1000) Cardiac Rhythm:  [-] Atrial fibrillation (09/16 0800) Resp:  [14-20] 20 (09/16 1000) BP: (128-145)/(63-95) 131/74 mmHg (09/16 1000) SpO2:  [91 %-99 %] 99 % (09/16 0800) Weight:  [153 lb (69.4 kg)] 153 lb (69.4 kg) (09/16 1200)   Recent Labs Lab 02/23/14 1856 02/23/14 2221 02/24/14 0805  GLUCAP 140* 127* 151*    Recent Labs Lab 02/23/14 1023 02/23/14 1032 02/24/14 0239  NA 140 139 142  K 3.7 3.5* 3.7  CL 100 99 101  CO2 26  --  28  GLUCOSE  214* 218* 135*  BUN CREATININE 0.61 0.70 0.57  CALCIUM 9.0  --  8.8    Recent Labs Lab 02/23/14 1023 02/24/14 0239  AST 19 17  ALT 9 8  ALKPHOS 95 78  BILITOT 0.5 0.6  PROT 7.0 6.3  ALBUMIN 3.8 3.2*    Recent Labs Lab 02/23/14 1023 02/23/14 1032 02/24/14 0239  WBC 10.7*  --  11.5*  NEUTROABS 8.2*  --   --   HGB 14.0 15.3* 12.6  HCT 40.4 45.0 37.8  MCV 86.1  --  89.2  PLT 149*  --  148*   No results found for this basename: CKTOTAL, CKMB, CKMBINDEX, TROPONINI,  in the last 168 hours  Recent Labs  02/23/14 1023 02/24/14 0239  LABPROT 15.2 14.6  INR 1.20 1.14    Recent Labs  02/23/14 1147  COLORURINE YELLOW  LABSPEC 1.009  PHURINE 7.0  GLUCOSEU NEGATIVE  HGBUR NEGATIVE  BILIRUBINUR NEGATIVE  KETONESUR NEGATIVE  PROTEINUR NEGATIVE  UROBILINOGEN 0.2  NITRITE NEGATIVE  LEUKOCYTESUR NEGATIVE       Component Value Date/Time   CHOL 117 01/19/2014 0502   TRIG 55 01/19/2014 0502   HDL 60 01/19/2014 0502   CHOLHDL 2.0 01/19/2014 0502   VLDL 11 01/19/2014 0502   LDLCALC 46 01/19/2014 0502   Lab Results  Component Value Date   HGBA1C 6.6* 01/19/2014      Component Value Date/Time   LABOPIA NONE DETECTED  02/23/2014 1147   COCAINSCRNUR NONE DETECTED 02/23/2014 1147   LABBENZ NONE DETECTED 02/23/2014 1147   AMPHETMU NONE DETECTED 02/23/2014 1147   THCU NONE DETECTED 02/23/2014 1147   LABBARB NONE DETECTED 02/23/2014 1147     Recent Labs Lab 02/23/14 1023  ETH <11    Ct Head Wo Contrast  02/24/2014  IMPRESSION: Extensive intraventricular hemorrhage, 9 mm LEFT to RIGHT midline shift, no convincing evidence of hydrocephalus.  More conspicuous trace subarachnoid blood within RIGHT frontotemporal sulci.  Severe white matter changes suggest chronic small vessel ischemic disease.     Ct Head Wo Contrast  02/23/2014   IMPRESSION: Extensive hemorrhage throughout the left lateral ventricle with the hemorrhagic focus appearing to arise from the head of the  caudate nucleus on the left. There is mild impression on the third ventricle with minimal midline shift of the third ventricle toward the right. There is underlying atrophy with extensive supratentorial small vessel disease.     PHYSICAL EXAM  Temp:  [98.4 F (36.9 C)-99.8 F (37.7 C)] 98.4 F (36.9 C) (09/16 0800) Pulse Rate:  [102-134] 103 (09/16 1000) Resp:  [14-20] 20 (09/16 1000) BP: (128-145)/(63-95) 131/74 mmHg (09/16 1000) SpO2:  [91 %-99 %] 99 % (09/16 0800) Weight:  [153 lb (69.4 kg)] 153 lb (69.4 kg) (09/16 1200)  General - Well nourished, well developed, mild distress on right arm and leg pain.  Ophthalmologic - not able to see through  Cardiovascular - irregularly irregular heart rate.  Neuro - limited due to global aphasia. On voice stimulation, pt awake, not alert, global aphasia, not following commands, able to make sounds but not meaningful words. PERRL, attends to both sides, right facial droop, right UE 3-/5, not able to hold much for antigravity, RLE 3/5, LUE and LLE 5-/5, right babinski positive, reflex 1+ throught.  ASSESSMENT/PLAN  Hayley [name] is a 80 y.o. female with history of afib on apixaban presenting with acute aphasia and right sided weakness. She did not receive IV t-PA due to ICH with IVH. Imaging confirms a left large ICH with significant IVH extension. Likely due to anticoagulation. She received kcentra for reversal. Repeat CT in am showed stable hematoma.  Untraumatic hemorrhagic stroke secondary to anticoagulation use     eliquis (apixaban) prior to admission, now on no antithrombotics  CT showed left caudate bleeding with significant ventricle extension  Repeat CT in am showed stable hemorrhage.  SCDs for VTE prophylaxis  NPO    Bedrest  Had long discussion with pt family regarding diagnosis, prognosis and treatment options. Family stated that pt had expressed in the past that she does not want to live in a condition that she  would not have meaningful neurological recovery. Family/POA requested comfort care. Will honor pt's wishes.  Afib with RVR - changed from coumadin to eliquis one month ago to decrease the risk of ICH - no RVR currently - cardizam drip d/c'de due to comfort care - no antithrombotics due to ICH  Other Active Problems  Comfort care order in effect  Palliative care consult  Hospital day # 1  Marvel Plan, MD PhD Stroke Neurology 02/24/2014 6:02 PM    To contact Stroke Continuity provider, please refer to [email or web address]. After hours, contact General Neurology

## 2014-02-24 NOTE — Progress Notes (Signed)
MD order to make patient made comfort care. dc'ed IV cardizem per md order. Monitor changed to comfort care. Family at bedside.  Will continue to monitor.

## 2014-02-25 DIAGNOSIS — I619 Nontraumatic intracerebral hemorrhage, unspecified: Principal | ICD-10-CM

## 2014-02-25 DIAGNOSIS — Z515 Encounter for palliative care: Secondary | ICD-10-CM

## 2014-02-25 DIAGNOSIS — I4891 Unspecified atrial fibrillation: Secondary | ICD-10-CM

## 2014-02-25 NOTE — Progress Notes (Signed)
[facility]  Patient Status: Active (receiving services up to time of hospitalization)  [facility] is providing the following services: PT  If patient discharges after hours, please call [phone].   Hayley [name] 02/25/2014, 4:53 PM

## 2014-02-25 NOTE — Progress Notes (Signed)
STROKE TEAM PROGRESS NOTE   HISTORY Hayley [name] is an 80 y.o. female with a past medical history significant for HTN, atrial fibrillation on apixaban, recent TIA characterized by isolated aphasia, brought in via EMS due to acute onset aphasia. She is a DNR.  Patient was last known well at 4 am and became symptomatic around 9 am when she was noted to have trouble with comprehension and ability to communicate. EMS was summoned and brought to the ED where she was not significantly hypertensive, was alert and awake but frankly aphasic.  CT brain revealed an extensive hemorrhage throughout the left lateral ventricle with the hemorrhagic focus appearing to arise from the head of the caudate nucleus on the left. There is mild impression on the third ventricle with minimal midline shift of the third ventricle toward the right. No hydrocephalus noted.  Date last known well: 02/23/14  Time last known well: 4 am  tPA Given: no, ICH  NIHSS: 18   SUBJECTIVE (INTERVAL HISTORY) Her daughters and a son are at the bedside.  Overall  her condition is gradually worsening. Initiated comfort care yesterday. Today family said she was not wake up. Will transfer to hospice floor.  OBJECTIVE Temp:  [99.5 F (37.5 C)-99.6 F (37.6 C)] 99.6 F (37.6 C) (09/17 0700) Pulse Rate:  [128] 128 (09/17 0700) Cardiac Rhythm:  [-] Atrial fibrillation (09/17 0700) Resp:  [14-22] 14 (09/17 0700) BP: (138-159)/(91-99) 159/99 mmHg (09/17 0700) SpO2:  [91 %-92 %] 92 % (09/17 0700)   Recent Labs Lab 02/23/14 1856 02/23/14 2221 02/24/14 0805  GLUCAP 140* 127* 151*    Recent Labs Lab 02/23/14 1023 02/23/14 1032 02/24/14 0239  NA 140 139 142  K 3.7 3.5* 3.7  CL 100 99 101  CO2 26  --  28  GLUCOSE 214* 218* 135*  BUN CREATININE 0.61 0.70 0.57  CALCIUM 9.0  --  8.8    Recent Labs Lab 02/23/14 1023 02/24/14 0239  AST 19 17  ALT 9 8  ALKPHOS 95 78  BILITOT 0.5 0.6  PROT 7.0 6.3  ALBUMIN 3.8 3.2*     Recent Labs Lab 02/23/14 1023 02/23/14 1032 02/24/14 0239  WBC 10.7*  --  11.5*  NEUTROABS 8.2*  --   --   HGB 14.0 15.3* 12.6  HCT 40.4 45.0 37.8  MCV 86.1  --  89.2  PLT 149*  --  148*   No results found for this basename: CKTOTAL, CKMB, CKMBINDEX, TROPONINI,  in the last 168 hours  Recent Labs  02/23/14 1023 02/24/14 0239  LABPROT 15.2 14.6  INR 1.20 1.14    Recent Labs  02/23/14 1147  COLORURINE YELLOW  LABSPEC 1.009  PHURINE 7.0  GLUCOSEU NEGATIVE  HGBUR NEGATIVE  BILIRUBINUR NEGATIVE  KETONESUR NEGATIVE  PROTEINUR NEGATIVE  UROBILINOGEN 0.2  NITRITE NEGATIVE  LEUKOCYTESUR NEGATIVE       Component Value Date/Time   CHOL 117 01/19/2014 0502   TRIG 55 01/19/2014 0502   HDL 60 01/19/2014 0502   CHOLHDL 2.0 01/19/2014 0502   VLDL 11 01/19/2014 0502   LDLCALC 46 01/19/2014 0502   Lab Results  Component Value Date   HGBA1C 6.6* 01/19/2014      Component Value Date/Time   LABOPIA NONE DETECTED 02/23/2014 1147   COCAINSCRNUR NONE DETECTED 02/23/2014 1147   LABBENZ NONE DETECTED 02/23/2014 1147   AMPHETMU NONE DETECTED 02/23/2014 1147   THCU NONE DETECTED 02/23/2014 1147   LABBARB NONE DETECTED 02/23/2014  1147     Recent Labs Lab 02/23/14 1023  ETH <11    Ct Head Wo Contrast  02/24/2014  IMPRESSION: Extensive intraventricular hemorrhage, 9 mm LEFT to RIGHT midline shift, no convincing evidence of hydrocephalus.  More conspicuous trace subarachnoid blood within RIGHT frontotemporal sulci.  Severe white matter changes suggest chronic small vessel ischemic disease.     Ct Head Wo Contrast  02/23/2014   IMPRESSION: Extensive hemorrhage throughout the left lateral ventricle with the hemorrhagic focus appearing to arise from the head of the caudate nucleus on the left. There is mild impression on the third ventricle with minimal midline shift of the third ventricle toward the right. There is underlying atrophy with extensive supratentorial small vessel disease.      PHYSICAL EXAM  Temp:  [99.5 F (37.5 C)-99.6 F (37.6 C)] 99.6 F (37.6 C) (09/17 0700) Pulse Rate:  [128] 128 (09/17 0700) Resp:  [14-22] 14 (09/17 0700) BP: (138-159)/(91-99) 159/99 mmHg (09/17 0700) SpO2:  [91 %-92 %] 92 % (09/17 0700)  General - Well nourished, well developed, mild distress on right arm and leg pain.  Ophthalmologic - not able to see through  Cardiovascular - irregularly irregular heart rate.  Neuro - limited due to comfort care. Not open eyes on voice stimulation, moaning, not following commands, able to make sounds but not meaningful words. Pupil equal but sluggish to light, right facial droop, no spontaneous moving in all 4 extremities, did not do pain stimulation due to comfort care.  ASSESSMENT/PLAN  [name] is a 80 y.o. female with history of afib on apixaban presenting with acute aphasia and right sided weakness. She did not receive IV t-PA due to ICH with IVH. Imaging confirms a left large ICH with significant IVH extension. Likely due to anticoagulation. She received kcentra for reversal. Repeat CT in am showed stable hematoma.  Untraumatic hemorrhagic stroke secondary to anticoagulation use     eliquis (apixaban) prior to admission, now on no antithrombotics  CT showed left caudate bleeding with significant ventricle extension  Repeat CT in am showed stable hemorrhage.  NPO    Bedrest  Had long discussion with pt family regarding diagnosis, prognosis and treatment options. Family stated that pt had expressed in the past that she does not want to live in a condition that she would not have meaningful neurological recovery. Family/POA requested comfort care and will continue comfort care.  Transfer to [facility] hospice floor.  Afib with RVR - changed from coumadin to eliquis one month ago to decrease the risk of ICH - no RVR currently - cardizam drip d/c'de due to comfort care - no antithrombotics due to ICH  Other Active  Problems  Comfort care order in effect  Palliative care consult  Hospital day # 2  Marvel Plan, MD PhD Stroke Neurology 02/25/2014 5:24 PM    To contact Stroke Continuity provider, please refer to [email or web address]. After hours, contact General Neurology

## 2014-02-25 NOTE — Progress Notes (Signed)
Patient Hayley [name]      DOB: 1920-08-17      EAV:[id number]   Palliative Medicine Team at [facility] Progress Note    Subjective: Patient was easy to awaken this am and smiled at me and attempted to talk but is aphasic. Hemiparetic on the right.  No family at the bedside will catch them later today. Noted low grade temp.  No respiratory distress. Will need to see if this is a plateau or golden moment.  Filed Vitals:   02/25/14 0700  BP: 159/99  Pulse: 128  Temp: 99.6 F (37.6 C)  Resp: 14   Physical exam:  General: no acute distress PERRL, aphasic , mm, dress Chest: decreased  , no RRW CVS: irregular , tachy, S1, S2 Abd: soft, not distended or tender Ext: warm, hemiparetic on the right Moving left upper and lower ext Neuro:  Hemiparetic on left,  Awakens but aphasic    Assessment and plan: 80 yr old white female s/p large subarachnoid bleed.  Patient slightly more awake this am.  Aphasic and hemiparetic.  Comfort Care desired by family. If patient plateaus consider BP if declines overnight would GIP patient.    1. DNR  2. Pain and dyspnea: Morphine 1 mg prn q 1 hour.  Patient has chronic pain related to right shoulder prosthesis and previous left femur fracture.  3.  Fever: Tylenol prn   4.  Terminal secretions - scop in place.   Time: 715-730 am returned to see family, Time 330-345 pm   Fusae Florio L. Ladona Ridgel, MD MBA The Palliative Medicine Team at [facility] Phone: [phone] Pager: [phone] ( Use team phone after hours)

## 2014-02-25 NOTE — Progress Notes (Signed)
Courtesy. Agree with current plans.

## 2014-02-26 ENCOUNTER — Inpatient Hospital Stay (HOSPITAL_COMMUNITY)
Admission: AD | Admit: 2014-02-26 | Discharge: 2014-03-11 | DRG: 064 | Disposition: E | Source: Ambulatory Visit | Attending: Internal Medicine | Admitting: Internal Medicine

## 2014-02-26 DIAGNOSIS — G8929 Other chronic pain: Secondary | ICD-10-CM | POA: Diagnosis present

## 2014-02-26 DIAGNOSIS — Z515 Encounter for palliative care: Secondary | ICD-10-CM | POA: Diagnosis not present

## 2014-02-26 DIAGNOSIS — Z806 Family history of leukemia: Secondary | ICD-10-CM | POA: Diagnosis not present

## 2014-02-26 DIAGNOSIS — Z882 Allergy status to sulfonamides status: Secondary | ICD-10-CM

## 2014-02-26 DIAGNOSIS — IMO0002 Reserved for concepts with insufficient information to code with codable children: Secondary | ICD-10-CM | POA: Diagnosis present

## 2014-02-26 DIAGNOSIS — D693 Immune thrombocytopenic purpura: Secondary | ICD-10-CM | POA: Diagnosis present

## 2014-02-26 DIAGNOSIS — G9349 Other encephalopathy: Secondary | ICD-10-CM | POA: Diagnosis present

## 2014-02-26 DIAGNOSIS — I4891 Unspecified atrial fibrillation: Secondary | ICD-10-CM | POA: Diagnosis present

## 2014-02-26 DIAGNOSIS — Z888 Allergy status to other drugs, medicaments and biological substances status: Secondary | ICD-10-CM

## 2014-02-26 DIAGNOSIS — Z66 Do not resuscitate: Secondary | ICD-10-CM | POA: Diagnosis present

## 2014-02-26 DIAGNOSIS — I10 Essential (primary) hypertension: Secondary | ICD-10-CM | POA: Diagnosis present

## 2014-02-26 DIAGNOSIS — M79609 Pain in unspecified limb: Secondary | ICD-10-CM

## 2014-02-26 DIAGNOSIS — Z823 Family history of stroke: Secondary | ICD-10-CM

## 2014-02-26 DIAGNOSIS — Z833 Family history of diabetes mellitus: Secondary | ICD-10-CM | POA: Diagnosis not present

## 2014-02-26 DIAGNOSIS — R4701 Aphasia: Secondary | ICD-10-CM | POA: Diagnosis present

## 2014-02-26 DIAGNOSIS — R0600 Dyspnea, unspecified: Secondary | ICD-10-CM

## 2014-02-26 DIAGNOSIS — M79629 Pain in unspecified upper arm: Secondary | ICD-10-CM

## 2014-02-26 DIAGNOSIS — I6350 Cerebral infarction due to unspecified occlusion or stenosis of unspecified cerebral artery: Secondary | ICD-10-CM | POA: Diagnosis present

## 2014-02-26 DIAGNOSIS — I482 Chronic atrial fibrillation, unspecified: Secondary | ICD-10-CM | POA: Diagnosis present

## 2014-02-26 DIAGNOSIS — E039 Hypothyroidism, unspecified: Secondary | ICD-10-CM | POA: Diagnosis present

## 2014-02-26 DIAGNOSIS — I609 Nontraumatic subarachnoid hemorrhage, unspecified: Secondary | ICD-10-CM | POA: Diagnosis present

## 2014-02-26 DIAGNOSIS — Z8249 Family history of ischemic heart disease and other diseases of the circulatory system: Secondary | ICD-10-CM

## 2014-02-26 DIAGNOSIS — S06369S Traumatic hemorrhage of cerebrum, unspecified, with loss of consciousness of unspecified duration, sequela: Secondary | ICD-10-CM

## 2014-02-26 MED ORDER — MORPHINE BOLUS VIA INFUSION
1.0000 mg | INTRAVENOUS | Status: DC | PRN
  Administered 2014-02-26 (×5): 1 mg via INTRAVENOUS
  Filled 2014-02-26: qty 1

## 2014-02-26 MED ORDER — LORAZEPAM 2 MG/ML IJ SOLN
0.5000 mg | INTRAMUSCULAR | Status: DC
  Administered 2014-02-26 – 2014-02-27 (×5): 0.5 mg via INTRAVENOUS
  Filled 2014-02-26 (×5): qty 1

## 2014-02-26 MED ORDER — ONDANSETRON HCL 4 MG/2ML IJ SOLN: 4.0000 mg | Freq: Four times a day (QID) | INTRAMUSCULAR | Status: DC | PRN

## 2014-02-26 MED ORDER — ATROPINE SULFATE 1 % OP SOLN
4.0000 [drp] | OPHTHALMIC | Status: DC | PRN
  Administered 2014-02-28: 4 [drp] via SUBLINGUAL
  Filled 2014-02-26: qty 2

## 2014-02-26 MED ORDER — MORPHINE SULFATE 10 MG/ML IJ SOLN
0.5000 mg/h | INTRAVENOUS | Status: DC
  Administered 2014-02-26: 0.5 mg/h via INTRAVENOUS
  Filled 2014-02-26: qty 10

## 2014-02-26 MED ORDER — ACETAMINOPHEN 650 MG RE SUPP: 650.0000 mg | RECTAL | Status: DC | PRN

## 2014-02-26 MED ORDER — LORAZEPAM 2 MG/ML IJ SOLN
0.5000 mg | INTRAMUSCULAR | Status: DC | PRN
  Administered 2014-02-26: 0.5 mg via INTRAVENOUS
  Filled 2014-02-26: qty 1

## 2014-02-26 MED ORDER — SCOPOLAMINE 1 MG/3DAYS TD PT72
1.0000 | MEDICATED_PATCH | TRANSDERMAL | Status: DC
  Administered 2014-02-27: 1.5 mg via TRANSDERMAL
  Filled 2014-02-26: qty 1

## 2014-02-26 MED ORDER — MORPHINE BOLUS VIA INFUSION
1.0000 mg | INTRAVENOUS | Status: DC | PRN
  Administered 2014-02-26 – 2014-02-28 (×13): 1 mg via INTRAVENOUS
  Filled 2014-02-26: qty 1

## 2014-02-26 MED ORDER — HALOPERIDOL LACTATE 2 MG/ML PO CONC
0.5000 mg | ORAL | Status: DC | PRN
  Filled 2014-02-26: qty 0.3

## 2014-02-26 MED ORDER — MORPHINE SULFATE 10 MG/ML IJ SOLN
1.5000 mg/h | INTRAVENOUS | Status: DC
  Administered 2014-02-28: 3 mg/h via INTRAVENOUS
  Filled 2014-02-26 (×2): qty 10

## 2014-02-26 NOTE — Discharge Summary (Signed)
Stroke Discharge Summary  Hayley [name]   MRN: [id number]      DOB: 10-30-1920  Date of Admission: 02/23/2014 Date of Discharge: Mar 13, 2014  Attending Physician:  Marvel Plan  Consulting Physician(s):   Treatment Team:  Palliative [facility] rehabilitation medicine  Hayley's PCP:  Gwen Pounds, MD  DISCHARGE DIAGNOSIS:  Active Problems:   ICH (intracerebral hemorrhage)  BMI: Body mass index is 27.11 kg/(m^2).  Past Medical History  Diagnosis Date  . Chronic ITP (idiopathic thrombocytopenia)   . Chronic edema     BLE  . Thyroid disease     Hypothyroid  . Hypertension     MD took pt off of meds 04/2012  . Hypothyroidism   . Pelvic fracture October 2014    Recent fall  . Chronic atrial fibrillation     History of RVR June 2012   Past Surgical History  Procedure Laterality Date  . Shoulder hemi-arthroplasty  06/23/2012    Procedure: SHOULDER HEMI-ARTHROPLASTY;  Surgeon: Harvie Junior, MD;  Location: [facility] OR;  Service: Orthopedics;  Laterality: Right;  . Abdominal hysterectomy      1998  . Cesarean section      1969  . Bunionectomy      2000  . Orif elbow fracture  07/16/2012    Procedure: OPEN REDUCTION INTERNAL FIXATION (ORIF) ELBOW/OLECRANON FRACTURE;  Surgeon: Harvie Junior, MD;  Location: [facility] OR;  Service: Orthopedics;  Laterality: Right;  . Doppler echocardiography  12/2010     mild coccentric LVH but normal diastolic function of age. mild to moderate mtral regurg. moderately dilated right atrium and left atrium.   . Doppler echocardiography    . Event monitor  12/16/2010-01/10/2011    afib c-occ rvr      Medication List    ASK your doctor about these medications       ALPRAZolam 0.25 MG tablet  Commonly known as:  XANAX  Take 0.25 mg by mouth 3 (three) times daily as needed for anxiety.     Biotin 5000 MCG Tabs  Take 5,000 mcg by mouth daily.     calcium-vitamin D 500-200 MG-UNIT per tablet  Commonly known as:  OSCAL WITH D  Take 1 tablet  by mouth daily.     diltiazem 360 MG 24 hr capsule  Commonly known as:  CARDIZEM CD  Take 360 mg by mouth daily.     Echinacea 400 MG Caps  Take 400 mg by mouth daily.     ELIQUIS 5 MG Tabs tablet  Generic drug:  apixaban  Take 2.5 mg by mouth 2 (two) times daily.     furosemide 20 MG tablet  Commonly known as:  LASIX  Take 20-40 mg by mouth daily.     levothyroxine 100 MCG tablet  Commonly known as:  SYNTHROID, LEVOTHROID  Take 100 mcg by mouth daily.     metoprolol tartrate 25 MG tablet  Commonly known as:  LOPRESSOR  Take 1 tablet (25 mg total) by mouth 2 (two) times daily.     multivitamin with minerals Tabs tablet  Take 1 tablet by mouth daily.     potassium chloride SA 20 MEQ tablet  Commonly known as:  K-DUR,KLOR-CON  Take 10-20 mEq by mouth daily.     sertraline 100 MG tablet  Commonly known as:  ZOLOFT  Take 100 mg by mouth daily.     simvastatin 40 MG tablet  Commonly known as:  ZOCOR  Take 1  tablet (40 mg total) by mouth every evening.     thiamine 100 MG tablet  Commonly known as:  VITAMIN B-1  Take 100 mg by mouth daily.     vitamin C 500 MG tablet  Commonly known as:  ASCORBIC ACID  Take 500 mg by mouth daily.     vitamin E 1000 UNIT capsule  Take 1,000 Units by mouth daily.        LABORATORY STUDIES CBC    Component Value Date/Time   WBC 11.5* 02/24/2014 0239   WBC 11.4* 04/16/2013 1421   RBC 4.24 02/24/2014 0239   RBC 4.34 04/16/2013 1421   HGB 12.6 02/24/2014 0239   HGB 12.7 04/16/2013 1421   HCT 37.8 02/24/2014 0239   HCT 38.8 04/16/2013 1421   PLT 148* 02/24/2014 0239   PLT 184 04/16/2013 1421   MCV 89.2 02/24/2014 0239   MCV 89.4 04/16/2013 1421   MCH 29.7 02/24/2014 0239   MCH 29.2 04/16/2013 1421   MCHC 33.3 02/24/2014 0239   MCHC 32.7 04/16/2013 1421   RDW 13.4 02/24/2014 0239   RDW 13.8 04/16/2013 1421   LYMPHSABS 2.0 02/23/2014 1023   LYMPHSABS 2.5 04/16/2013 1421   MONOABS 0.4 02/23/2014 1023   MONOABS 0.8 04/16/2013 1421   EOSABS  0.1 02/23/2014 1023   EOSABS 0.3 04/16/2013 1421   BASOSABS 0.0 02/23/2014 1023   BASOSABS 0.0 04/16/2013 1421   CMP    Component Value Date/Time   NA 142 02/24/2014 0239   K 3.7 02/24/2014 0239   CL 101 02/24/2014 0239   CO2 28 02/24/2014 0239   GLUCOSE 135* 02/24/2014 0239   BUN 7 02/24/2014 0239   CREATININE 0.57 02/24/2014 0239   CALCIUM 8.8 02/24/2014 0239   PROT 6.3 02/24/2014 0239   ALBUMIN 3.2* 02/24/2014 0239   AST 17 02/24/2014 0239   ALT 8 02/24/2014 0239   ALKPHOS 78 02/24/2014 0239   BILITOT 0.6 02/24/2014 0239   GFRNONAA 79* 02/24/2014 0239   GFRAA >90 02/24/2014 0239   COAGS Lab Results  Component Value Date   INR 1.14 02/24/2014   INR 1.20 02/23/2014   INR 3.13* 01/19/2014   PROTIME 30.0* 04/16/2011   Lipid Panel    Component Value Date/Time   CHOL 117 01/19/2014 0502   TRIG 55 01/19/2014 0502   HDL 60 01/19/2014 0502   CHOLHDL 2.0 01/19/2014 0502   VLDL 11 01/19/2014 0502   LDLCALC 46 01/19/2014 0502   HgbA1C  Lab Results  Component Value Date   HGBA1C 6.6* 01/19/2014   Cardiac Panel (last 3 results) No results found for this basename: CKTOTAL, CKMB, TROPONINI, RELINDX,  in the last 72 hours Urinalysis    Component Value Date/Time   COLORURINE YELLOW 02/23/2014 1147   APPEARANCEUR CLEAR 02/23/2014 1147   LABSPEC 1.009 02/23/2014 1147   PHURINE 7.0 02/23/2014 1147   GLUCOSEU NEGATIVE 02/23/2014 1147   HGBUR NEGATIVE 02/23/2014 1147   BILIRUBINUR NEGATIVE 02/23/2014 1147   KETONESUR NEGATIVE 02/23/2014 1147   PROTEINUR NEGATIVE 02/23/2014 1147   UROBILINOGEN 0.2 02/23/2014 1147   NITRITE NEGATIVE 02/23/2014 1147   LEUKOCYTESUR NEGATIVE 02/23/2014 1147   Urine Drug Screen     Component Value Date/Time   LABOPIA NONE DETECTED 02/23/2014 1147   COCAINSCRNUR NONE DETECTED 02/23/2014 1147   LABBENZ NONE DETECTED 02/23/2014 1147   AMPHETMU NONE DETECTED 02/23/2014 1147   THCU NONE DETECTED 02/23/2014 1147   LABBARB NONE DETECTED 02/23/2014 1147    Alcohol Level  Component Value  Date/Time   ETH <11 02/23/2014 1023     SIGNIFICANT DIAGNOSTIC STUDIES Ct Head Wo Contrast  02/24/2014 IMPRESSION: Extensive intraventricular hemorrhage, 9 mm LEFT to RIGHT midline shift, no convincing evidence of hydrocephalus. More conspicuous trace subarachnoid blood within RIGHT frontotemporal sulci. Severe white matter changes suggest chronic small vessel ischemic disease.   Ct Head Wo Contrast  02/23/2014 IMPRESSION: Extensive hemorrhage throughout the left lateral ventricle with the hemorrhagic focus appearing to arise from the head of the caudate nucleus on the left. There is mild impression on the third ventricle with minimal midline shift of the third ventricle toward the right. There is underlying atrophy with extensive supratentorial small vessel disease.     HISTORY OF PRESENT ILLNESS Hayley [name] is an 80 y.o. female with a past medical history significant for HTN, atrial fibrillation on apixaban, recent TIA characterized by isolated aphasia, brought in via EMS due to acute onset aphasia. She is a DNR.  Hayley was last known well at 4 am and became symptomatic around 9 am when she was noted to have trouble with comprehension and ability to communicate. EMS was summoned and brought to the ED where she was not significantly hypertensive, was alert and awake but frankly aphasic.  CT brain revealed an extensive hemorrhage throughout the left lateral ventricle with the hemorrhagic focus appearing to arise from the head of the caudate nucleus on the left. There is mild impression on the third ventricle with minimal midline shift of the third ventricle toward the right. No hydrocephalus noted.  Date last known well: 02/23/14  Time last known well: 4 am  tPA Given: no, ICH  NIHSS: 18    HOSPITAL COURSE Imaging confirms a left large ICH with significant IVH extension. Likely due to anticoagulation. She received kcentra for reversal. Repeat CT in am showed stable hematoma. Had long  discussion with pt family regarding diagnosis, prognosis and treatment options. Family stated that pt had expressed in the past that she does not want to live in a condition that she would not have meaningful neurological recovery. Family/POA requested comfort care. Palliative care consult with Dr. Rutherford Nail on board for comfort care.  Untraumatic hemorrhagic stroke secondary to anticoagulation use  eliquis (apixaban) prior to admission, now on no antithrombotics  CT showed left caudate bleeding with significant ventricle extension  Repeat CT in am showed stable hemorrhage.  SCDs for VTE prophylaxis  NPO  Bedrest   Afib with RVR  - changed from coumadin to eliquis one month ago to decrease the risk of ICH  - no RVR currently  - cardizam drip d/c'de due to comfort care  - no antithrombotics due to ICH   Other Active Problems  Comfort care order in effect  Palliative care consult   DISCHARGE EXAM Blood pressure 152/109, pulse 72, temperature 99.1 F (37.3 C), temperature source Axillary, resp. rate 10, height  (1.6 m), weight 153 lb (69.4 kg), SpO2 95.00%.   General - Well nourished, well developed, mild distress on right arm and leg pain.  Ophthalmologic - not able to see through  Cardiovascular - irregularly irregular heart rate.  Neuro - limited due to global aphasia. On voice stimulation, pt awake, not alert, global aphasia, not following commands, able to make sounds but not meaningful words. PERRL, attends to both sides, right facial droop, right UE 3-/5, not able to hold much for antigravity, RLE 3/5, LUE and LLE 5-/5, right babinski positive, reflex 1+ throught.   Discharge  Diet  NPO  DISCHARGE PLAN  Disposition:  Palliative care   none for secondary stroke prevention.Marland Kitchen  Appreciate palliative care consult  25 minutes were spent preparing discharge.  Signed  Marvel Plan, MD PhD Stroke Neurology 02/13/2014 11:22 PM

## 2014-02-26 NOTE — Progress Notes (Signed)
Patient ZO:XWRU KIMIAH HIBNER      DOB: 10/09/1920      EAV:[id number]   Palliative Medicine Team at [facility] Progress Note    Subjective: Patient with increased pain this am grimacing, frowning and vocalizing.   Agitated slightly, shaking her finger at her daughters. Currently, back to sleep but moaning softly.  Filed Vitals:   2014/03/14 0540  BP: 152/109  Pulse: 72  Temp: 99.1 F (37.3 C)  Resp: 10   Physical exam:  General:   Moaning softly, brow furrowed Pupils not examined at this time, mm dry Chest decreased but clear, no wheezing  CVS Irregular ,S1, S2 Abd: soft, obese,  Ext: warm, no mottling Neuro: obtunded s/p medication but did open eyes briefly at the end of my exam     Assessment and plan: 80 yr old white female s/p subarachnoid hemorrhage.  Patient's family has elected comfort care treating symptoms only.  Increased evidence for pain this am with agitation.    1.  DNR  2.  Pain:  Requiring increased pain meds total 12 mg in last 24 hrs.  Will initiate morphine low dose continuous 0.5 mg/hr with boluses.  3.  Agitation and anxiety: prn ativan  4.  Terminal secretions: scopolamine    Total time 915- 950 am   Robey Massmann L. Ladona Ridgel, MD MBA The Palliative Medicine Team at [facility] Phone: [phone] Pager: [phone] ( Use team phone after hours)

## 2014-02-26 NOTE — H&P (Signed)
Palliative Medicine Team at [place] (GIP) Admission Date: 03/04/2014   Patient Name: Hayley [name]  DOB: May 22, 1921  MRN: [id number]  Age / Sex: 80 y.o., female   PCP: Precious Reel, MD Referring Physician:   Active Problems: Active Problems:   Chronic atrial fibrillation   Unspecified cerebral artery occlusion with cerebral infarction   Pain in limb   Other encephalopathy   HPI/Reason for Consultation: Hata is a 80 y.o. Patient with a history of atrial fibrillation, hypertension, chronic limb pain related to previous falls with healed fractures of the left arm, and a right shoulder implant.  Patient presented with aphasia and difficulty understanding commands.  A Code Stroke was initiated.  Patient was found to have an extensive subarachnoid bleeding with extension into the ventricles and midline shift.  She was able to initially rouse intermittently but subsequently became more somnolent requiring frequent pain medications for grimacing and moaning.  Of note she had a history of chronic pain related to her shoulder surgery and fracture of her left arm.  I initially saw this patient in consult for goals of care and symptom management. We adjusted her prn medications but noted today that she has been requiring medication q hourly and remains with increased moaning , tongue chewing and seeming agitation.  Earlier today we initiated a low dose morphine drip to address these symptoms and have required this afternoon an increase with need to schedule her ativan for biting her tongue.I met with her daughters this morning and her sons this afternoon.   Family desires full comfort and have accept hospice inpatient level of care to help manage her symptoms at the end of life.  Primary Contacts: HCPOA: Charley Lafrance [phone] Advance Directive: Yes    Code Status Orders        Start     Ordered   02/24/2014 0973  Do not attempt resuscitation (DNR)    Continuous    Question Answer Comment  In the event of cardiac or respiratory ARREST Do not call a "code blue"   In the event of cardiac or respiratory ARREST Do not perform Intubation, CPR, defibrillation or ACLS   In the event of cardiac or  respiratory ARREST Use medication by any route, position, wound care, and other measures to relive pain and suffering. May use oxygen, suction and manual treatment of airway obstruction as needed for comfort.      02/12/2014 1606      I have reviewed the medical record, interviewed the patient and family, and examined the patient. The following aspects are pertinent.  Past Medical History  Diagnosis Date  . Chronic ITP (idiopathic thrombocytopenia)   . Chronic edema     BLE  . Thyroid disease     Hypothyroid  . Hypertension     MD took pt off of meds 04/2012  . Hypothyroidism   . Pelvic fracture October 2014    Recent fall  . Chronic atrial fibrillation     History of RVR June 2012    History   Social History  . Marital Status: Widowed    Spouse Name: N/A    Number of Children: N/A  . Years of Education: N/A   Social History Main Topics  . Smoking status: Never Smoker   . Smokeless tobacco: Not on file  . Alcohol Use: No  . Drug Use: No  . Sexual Activity: Not Currently   Other Topics Concern  . Not on  file   Social History Narrative   She is a widowed mother of 39, grandmother of 3.    She has completed her post fracture PT.    Does not smoke, does not drink.     Family History  Problem Relation Age of Onset  . Stomach cancer Mother   . Diabetes Mother   . Heart attack Father   . Leukemia Sister   . Stroke Brother   . Stroke Sister     Scheduled Meds: . [START ON 02/27/2014] scopolamine  1 patch Transdermal Q72H   Continuous Infusions: . morphine     PRN Meds:.acetaminophen, haloperidol, LORazepam, morphine, ondansetron (ZOFRAN) IV Allergies  Allergen Reactions  . Sulfa Antibiotics Hives  . Ultram [Tramadol]  Other (See Comments)    Hallucinations.    Labs and Data Reviewed:     Component Value Date/Time   WBC 11.5* 02/24/2014 0239   WBC 11.4* 04/16/2013 1421   HGB 12.6 02/24/2014 0239   HGB 12.7 04/16/2013 1421   HCT 37.8 02/24/2014 0239   HCT 38.8 04/16/2013 1421   PLT 148* 02/24/2014 0239   PLT 184 04/16/2013 1421   MCV 89.2 02/24/2014 0239   MCV 89.4 04/16/2013 1421   NEUTROABS 8.2* 02/23/2014 1023   NEUTROABS 7.7* 04/16/2013 1421   LYMPHSABS 2.0 02/23/2014 1023   LYMPHSABS 2.5 04/16/2013 1421   MONOABS 0.4 02/23/2014 1023   MONOABS 0.8 04/16/2013 1421   EOSABS 0.1 02/23/2014 1023   EOSABS 0.3 04/16/2013 1421   BASOSABS 0.0 02/23/2014 1023   BASOSABS 0.0 04/16/2013 1421      Component Value Date/Time   NA 142 02/24/2014 0239   K 3.7 02/24/2014 0239   CL 101 02/24/2014 0239   CO2 28 02/24/2014 0239   BUN 7 02/24/2014 0239   CREATININE 0.57 02/24/2014 0239   GLUCOSE 135* 02/24/2014 0239   CALCIUM 8.8 02/24/2014 0239   AST 17 02/24/2014 0239   ALT 8 02/24/2014 0239   ALKPHOS 78 02/24/2014 0239   BILITOT 0.6 02/24/2014 0239   PROT 6.3 02/24/2014 0239   ALBUMIN 3.2* 02/24/2014 0239    Vital Signs: Filed Vitals:    02/14/2014 0540   BP:  152/109   Pulse:  72   Temp:  99.1 F (37.3 C)   Resp:  10    Physical exam:  General: Moaning softly, brow furrowed  Pupils not examined at this time, mm dry  Chest decreased but clear, no wheezing  CVS Irregular ,S1, S2  Abd: soft, obese,  Ext: warm, no mottling  Neuro: obtunded s/p medication but did open eyes briefly at the end of my exam    Repeat exam in afternoon supports above but with brow furrowing and some intermittent moaning .    Assessment/Prognosis:  Primary Diagnoses/Hospice Dx:    Unspecified cerebral artery occlusion with cerebral infarction  Active Symptoms: 1. Tongue Chewing: no evidence for full seizure, but tongue noted to be slightly contused.  Start scheduled ativan. 2. Pain: history of chronic upper arm pain.  Increase  basilar rate of morphine to 1 mg/hr continue prn boluses. Titrate as needed 3. Terminal Secretions currently have scopolamine in place will add atropine for drooling. 4. Dyspnea/Increased work of breathing: titrate morphine for comfort.  5.  Low grade fever: tylenol suppository prn.  Patient requires aggressive, intensive treatment to control pain and symptoms. Care can not feasibly be provided in any other setting.  Prognosis:  Hours to days   PPS:  5%  Plan: 1.  DNR, Comfort Care . 2.  See symptom management recommendations above. 3. Daily assessment for need for  increased level care.    Transition Planning:  Per Hospice Team/ CSW Spiritual Care and Bereavement Planning: Per Hospice Team/CSW/Chaplain    Time In: 915/330 pm Time Out: 950/400 pm Total Time: 50 min Greater than 50%  of this time was spent counseling and coordinating care related to the above assessment and plan.  Signed by:  Billey Chang, MD Marcelle Smiling, MD  02/15/2014, 4:07 PM  Please contact Palliative Medicine Team phone at [phone] for questions and concerns.

## 2014-02-26 NOTE — Progress Notes (Signed)
Inpatient [facility] 6[facility] rm 20 [facility]) RN visit Francesco Runner RN  Request received from [facility] for eval;uation for GIP level of care: patient too unstable to move at this time, seen by PMT Dr. Derenda Mis. Family elected comfort care, IV fluids, IV Cardizem stopped. Pt seen at bedside at 3:00pm, chart and patient information reviewed with Dr. Elliot Gurney, [facility] medical Director, eligibility confirmed. Related GIP admission [facility] diagnosis of Cerebral Artery occlusion with Stroke (434.91). Pt is DNR code status.  Pt seen at beside, son Genevie Cheshire and family friends present. Patient nonverbal, opened her eyes for her son when he called her and gently shook her arm. Gaze with shift to the left, non focal. Facial grimaces noted with some soft moaning at times. Patient started on continuous morphine at 0.5mg /hr with  bolus q 30 PRN. Genevie Cheshire feels his mother looks much more comfortable. He stressed how important it was for his mother to remain comfortable and not have any pain. He is an EMT and stated " I can handle anything in the streets, but not my mama in pain". He is aware of the use of PRN pain boluses, encouraged to advocate for this with repositioning and personal care.  Brief apnea noted during visit. Secretions currently being managed with scopolamine TD.  Evaluation and medication adjustment for effective symptom management is on going.  [facility] will continue to collaborate daily with staff and PMT attending. Please call [facility] @ [phone] with any hospice needs Dayna Barker RN, BSN, [facility] Liaison [phone]

## 2014-02-26 NOTE — Care Management Note (Signed)
  Page 2 of 2   03/19/14     1:27:47 PM CARE MANAGEMENT NOTE 03-19-14  Patient:  Hayley [name], Hayley [name]   Account Number:  [id number]  Date Initiated:  02/24/2014  Documentation initiated by:  Donn Pierini  Subjective/Objective Assessment:   Pt admitted with ICH- failed swallow- on IV cardizem     Action/Plan:   PTA pt lived at home-  NCM to follow for d/c needs/planning- PT/OT evals pending-   Anticipated DC Date:  03/19/14   Anticipated DC Plan:    In-house referral  Clinical Social Worker      DC Planning Services  CM consult      Choice offered to / List presented to:             Status of service:  In process, will continue to follow Medicare Important Message given?  YES (If response is "NO", the following Medicare IM given date fields will be blank) Date Medicare IM given:  March 19, 2014 Medicare IM given by:  Ronny Flurry Date Additional Medicare IM given:   Additional Medicare IM given by:    Discharge Disposition:    Per UR Regulation:  Reviewed for med. necessity/level of care/duration of stay  If discussed at Long Length of Stay Meetings, dates discussed:    Comments:  Mar 19, 2014 Son Genevie Cheshire at bedside , and in agreement with GIP through [facility] [place] . [facility] aware. Ronny Flurry RN BSN   03-19-14 Spoke with DR Orie Fisherman .  Patient  is GIP appropriate . Called Dr Roda Shutters he is in agreement and would like attenting to change to palliative medicine.  Spoke to two of patient's daughters at bedside . Explained GIP , they are also in agreement . Their brother Genevie Cheshire [facility]) will be at bedside at noon.  NCM will return to patinet's room at noon.  Choice offered , daughters would like [facility] , they have used [facility] [place] before.  Called Clydie Braun with [facility] [place] . Will update Clydie Braun after speaking with Billy at noon. Ronny Flurry  RN BSN [phone]

## 2014-02-26 NOTE — Progress Notes (Signed)
Nutrition Brief Note  Patient identified on the </= 12 Braden score report. Patient's goal of care is comfort measures. No nutrition interventions warranted at this time.  Please consult as needed.   Maureen Chatters, RD, LDN Pager #: [phone] After-Hours Pager #: [phone]

## 2014-02-26 NOTE — H&P (Signed)
Patient [name]      DOB: 16-Jul-1920      DSK:[id number]  Patient known to me from consultation service. Today requiring increased opiate use converted to continuous infusion.  Met with POA and spoke with attending. Patient qualifies for GIP glad to accept on my service. Full H& P to follow.   Rayshawn Visconti L. Lovena Le, MD MBA The Palliative Medicine Team at [facility] Phone: [phone] Pager: [phone] ( Use team phone after hours)

## 2014-02-27 DIAGNOSIS — G9349 Other encephalopathy: Secondary | ICD-10-CM

## 2014-02-27 DIAGNOSIS — M79609 Pain in unspecified limb: Secondary | ICD-10-CM

## 2014-02-27 MED ORDER — LORAZEPAM 2 MG/ML IJ SOLN
1.0000 mg | INTRAMUSCULAR | Status: DC
  Administered 2014-02-27 – 2014-03-01 (×12): 1 mg via INTRAVENOUS
  Filled 2014-02-27 (×12): qty 1

## 2014-02-27 NOTE — Progress Notes (Signed)
Patient WU:JWJX Hayley [name]      DOB: 02-Jan-1921      BJY:[id number]   Palliative Medicine Team at [facility] Progress Note    Subjective: Patient moaning intermittently softly with noted C-S respiration and long apneic periods.  No family at bedside.  Updated them by phone.  Morphine will be increased with titration added.  Filed Vitals:   02/27/14 0528  BP: 152/115  Pulse: 133  Temp: 98.4 F (36.9 C)  Resp: 20   Physical exam:  General: soft moaning with increased work of breathing Pupil not examined, tongue contused Chest decreased with no RRW CVS; irregular, tachy ABd: soft obese, no grimace Ext warm,not mottled Neuro: not responsive to tactile or verbal stimuli.  Assessment and plan: 80 yr old s/p massive hemorrage. Currently comfort  Care.  Increased distress this am. With C-S respirations.   1.  DNR  2.  Dyspnea and pain: increase basilar morphine to 1. 5 mg with titration order and boluses.  3.  Tongue chewing improved with scheduled ativan.  Total time 715 am- 845 am  Roxana Lai L. Ladona Ridgel, MD MBA The Palliative Medicine Team at [facility] Phone: [phone] Pager: [phone] ( Use team phone after hours)

## 2014-02-27 NOTE — Progress Notes (Signed)
Related admission to [facility] diagnosis of cerebral artery occlusion with stroke.  DNR on chart.  Found patient non responsive and peaceful in bed, FLACC score 0.  Family at bedside.  Reviewed chart and patient continues to do well on scheduled Ativan and has received increase in Morphine infusion for some noted moaning and increased work of breathing this morning.  Continue with current comfort care plan and anticipate hospital death.  Encourage a call to Hospice at [phone] with any needs or at time of death.    17-Oct-2022 Costella Hatcher, RN, BSN Hospice

## 2014-02-27 NOTE — Progress Notes (Signed)
Patient WU:JWJX Hayley [name]      DOB: [birth year]      BJY:[id number]  Checking on patient.  Found with mouth open again as earlier today, slight moan.  Was chewing tongue again.    Plan to continue to titrate Morphine for perceived distress and increase ativan dose.   Discussed with Sons.  Hayley Newmann L. Ladona Ridgel, MD MBA The Palliative Medicine Team at [facility] Phone: [phone] Pager: [phone] ( Use team phone after hours)

## 2014-02-28 DIAGNOSIS — IMO0002 Reserved for concepts with insufficient information to code with codable children: Secondary | ICD-10-CM

## 2014-02-28 DIAGNOSIS — R0989 Other specified symptoms and signs involving the circulatory and respiratory systems: Secondary | ICD-10-CM

## 2014-02-28 DIAGNOSIS — Z515 Encounter for palliative care: Secondary | ICD-10-CM

## 2014-02-28 DIAGNOSIS — R0609 Other forms of dyspnea: Secondary | ICD-10-CM

## 2014-02-28 MED ORDER — ATROPINE SULFATE 1 % OP SOLN
4.0000 [drp] | OPHTHALMIC | Status: DC
  Administered 2014-02-28 – 2014-03-01 (×7): 4 [drp] via SUBLINGUAL
  Filled 2014-02-28: qty 2

## 2014-02-28 NOTE — Progress Notes (Signed)
Family gave me the funeral home service when the pt passes:  [facility] and [facility] [address] [place], [place]  [address].  [phone].  Family would like for the body to be picked up in the room if at all possible.

## 2014-02-28 NOTE — Progress Notes (Signed)
Subjective: 80 year old female with a history of atrial fibrillation, hypertension, chronic limb pain who was admitted with aphasia and found to have extensive subarachnoid bleeding with extension into ventricles with midline shift. After palliative care discussion patient was made total comfort and accepted at hospice inpatient level of care. She has been started on morphine infusion at 3 mg per hour, she remains obtunded and has developed , terminal secretions without rattle. She was started on scopolamine patch last night, and has when necessary order for atropine ophthalmic drops.  Filed Vitals:   02/28/14 0610  BP: 138/83  Pulse: 59  Temp: 99.8 F (37.7 C)  Resp: 6    Chest: Bilateral rhonchi Heart : S1S2 RRR Abdomen: Soft, nontender Ext : No edema,  Neuro: Remains obtunded  A/P Intracerebral bleed Encephalopathy Comfort measures  Will continue with morphine infusion at 3 mg per hour, she required 2 boluses of morphine last night. Has been comfortable with the current regimen. I will change atropine ophthalmic drops to  scheduled every 4 hours as patient continues to have terminal secretions. She was also started on scopolamine patch yesterday, will assess the response. Continue with Ativan 1 mg every 4 hours when necessary for anxiety and agitation  Meredeth Ide Palliative Medicine Team [phone]

## 2014-02-28 NOTE — Progress Notes (Signed)
Related admission to [facility] diagnosis of cerebral artery occlusion.  Patient is a DNR.  Patient is non responsive with Morphine infusion at /hr for comfort.  Patient had scopolamine patch added last night to address secretions, as well as Atropine ordered.  Patient continues to receive Ativan PRN for agitation.  Continue current comfort care and anticipate hospital death.  Encourage a call to Hospice at [phone] with any needs.  April Costella Hatcher, RN, BSN Hospice

## 2014-03-01 DIAGNOSIS — I6350 Cerebral infarction due to unspecified occlusion or stenosis of unspecified cerebral artery: Secondary | ICD-10-CM

## 2014-03-11 NOTE — Progress Notes (Signed)
Merrilee Seashore, RN witnessed the waste of morphine 26cc in sink.

## 2014-03-11 NOTE — Progress Notes (Signed)
Hayley [name] Referral done, body not suitable for any donation due to age. Referral no. [id number], talked with Bonna Gains.

## 2014-03-11 NOTE — Progress Notes (Signed)
Inpatient [facility] 6N rm 20- Lucianne Lei- [facility] ([facility])-K Jewett RN  Related admission to [facility] dx of Cerebral Artery Occlusion with Stroke (434.91). Pt is DNR code status. Pt seen at bedside, sons and daughters and family friends present. Family expressed that they felt pt was "more comfortable now". Pain is currently being  managed with continuous morphine gtt of 3 mg/hr, this has been increased steadily over the weekend and most recently to 3 mg on 9/20 as pt had required 8 PRN bolus doses on 9/19 with an increase to 2 mg/hr. Lorazepam has been changed to a scheduled 1 mg dose  Q 4 hrs to manage anxiety. Atropine is being used along with scopolamine patch to manage terminal secretions. Skin warm to touch, apnea of 10-15 seconds noted during visit, family had noted this prior to visit. Pt did not respond to voice or touch from family, family requested reassurance regarding providing touch, not wanting to "irritate" patient. Education provided to patient's daughter Kriste Basque regarding her questions about suctioning.  Evaluation and medication adjustment for effective symptom management is on going.  [facility] will continue to collaborate with the PMT attending and staff sand follow daily.  Dayna Barker RN BSN [facility] Liaison  [phone])

## 2014-03-11 NOTE — Discharge Summary (Signed)
Death Summary  MACAELA PRESAS ZOX:[id number] DOB: [birth year] DOA: 03-16-2014  PCP: Gwen Pounds, MD PCP/Office notified: yes  Admit date: 2014-03-16 Date of Death: 19-Mar-2014  Final Diagnoses:  Principal Problem:   Unspecified cerebral artery occlusion with cerebral infarction Active Problems:   Chronic atrial fibrillation   Pain in limb   Other encephalopathy  Hospital Course:  80 y.o. Woman with atrial fibrillation, hypertension, chronic limb pain related to previous falls with healed fractures of the left arm, and a right shoulder implant presented with aphasia and difficulty understanding commands to ED on 9/15. A Code Stroke was initiated and CT showed an extensive subarachnoid bleeding with extension into the ventricles and midline shift. After Palliative goals of care discussion was held- family elected for full comfort care-GIP admission requested and approved by [facility] for EOL symptom management.  Patient was placed on continuous infusion of morphine with frequent titration needs, scheduled ativan for agitation and scopolamine and oral suctioning for upper airway secretions. She expired at 15:50. Family was at bedside-I returned to bedside to confirm pronouncement and provide support to family. Death certificate completed by Dr. Ladona Ridgel and I verified information and completed time and date. Death cert with body.  Time: 30 minutes  Signed:  [employer]  [facility] 03/19/14, 5:47 PM

## 2014-03-11 NOTE — Progress Notes (Signed)
Nutrition Brief Note  Chart reviewed. Pt now transitioning to comfort care.  No further nutrition interventions warranted at this time.  Please re-consult as needed.   Jarian Longoria RD, LDN, CNSC [phone] Pager [phone] After Hours Pager    

## 2014-03-11 NOTE — Progress Notes (Signed)
Patient pronounced deceased at 48, verifed by two nurses Ishmael Holter, RN and Benard Halsted, RN.

## 2014-03-11 NOTE — Progress Notes (Signed)
MD paged to notify about the death.

## 2014-03-11 DEATH — deceased

## 2014-03-27 IMAGING — CR DG SHOULDER 2+V*R*
3 series · 3 of 3 positions shown · non-contrast
Comparison: Plain films right shoulder 08/23/2011.

CLINICAL DATA: Fall, pain.

RIGHT SHOULDER - 2+ VIEW

[x shoulder ap right (1 of 3)]
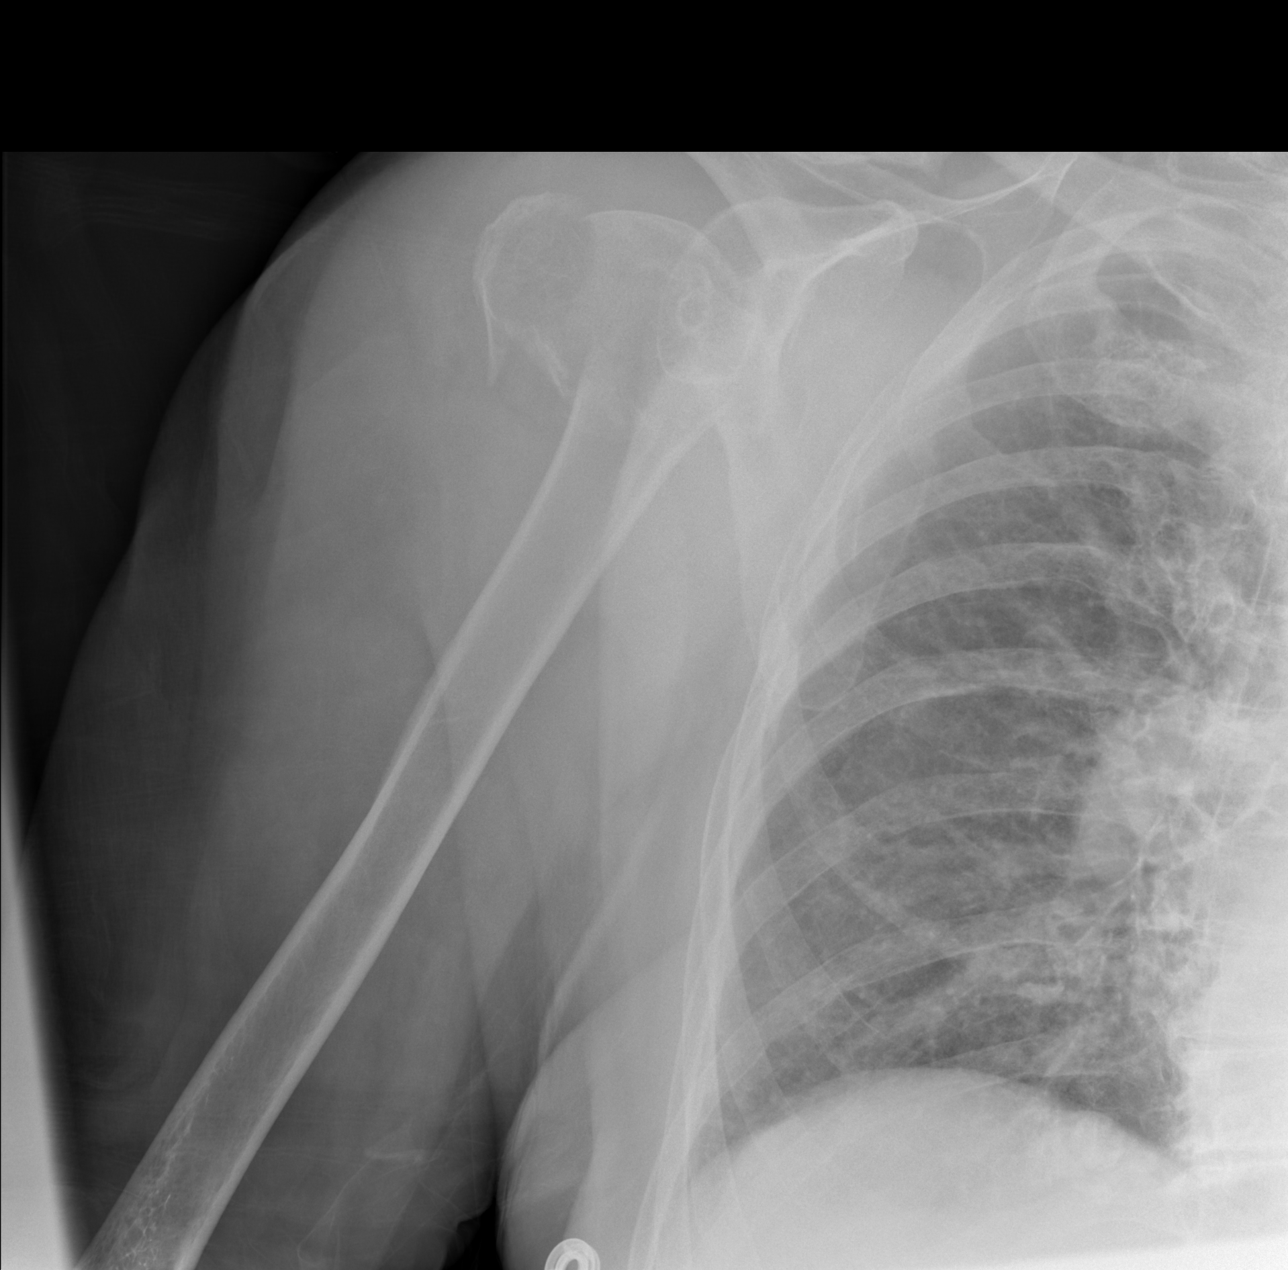

[x shoulder ap right (2 of 3)]
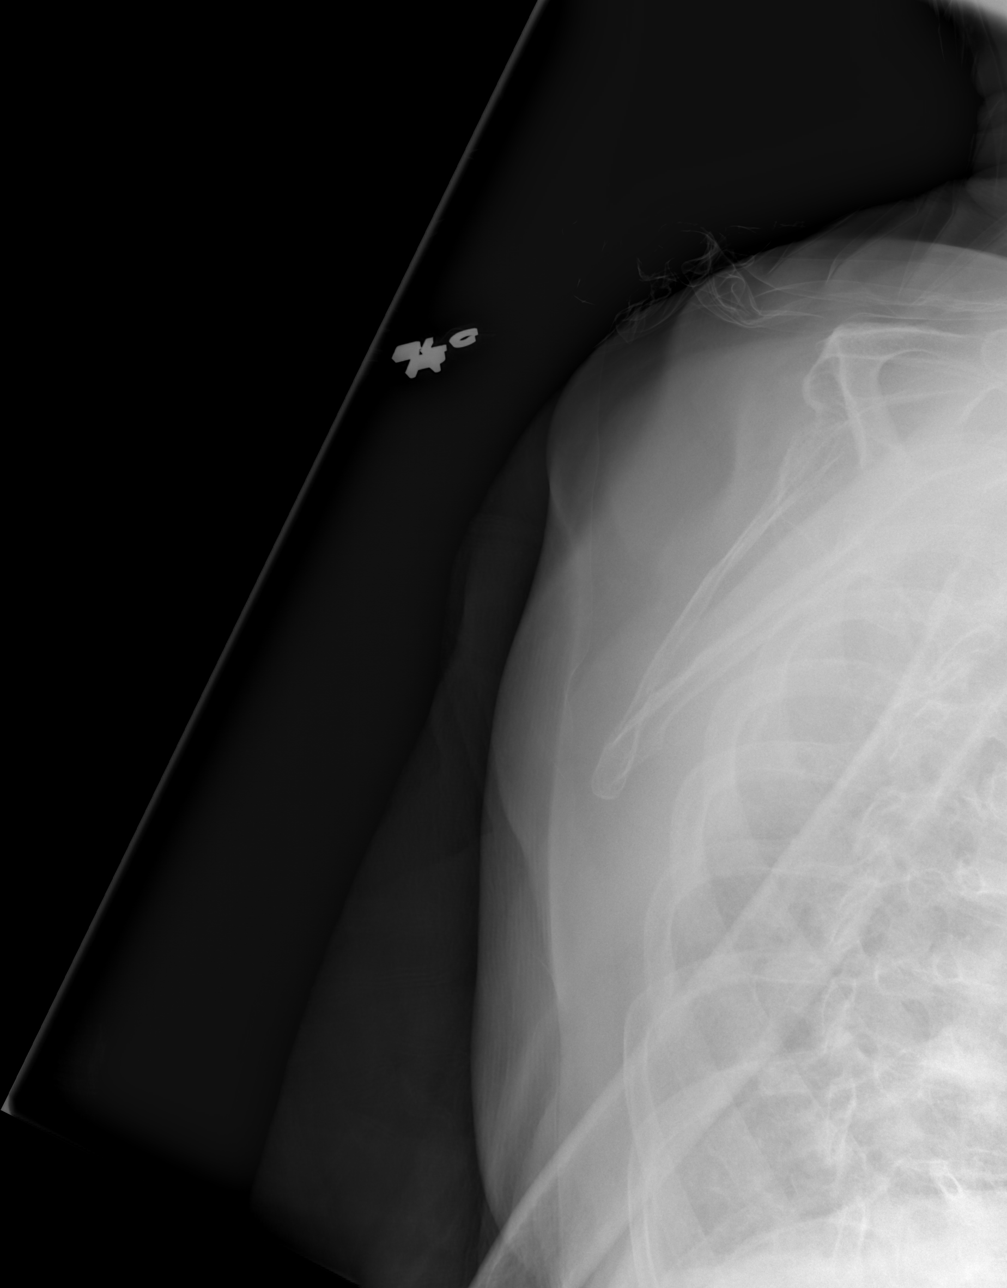

[x shoulder ap right (3 of 3)]
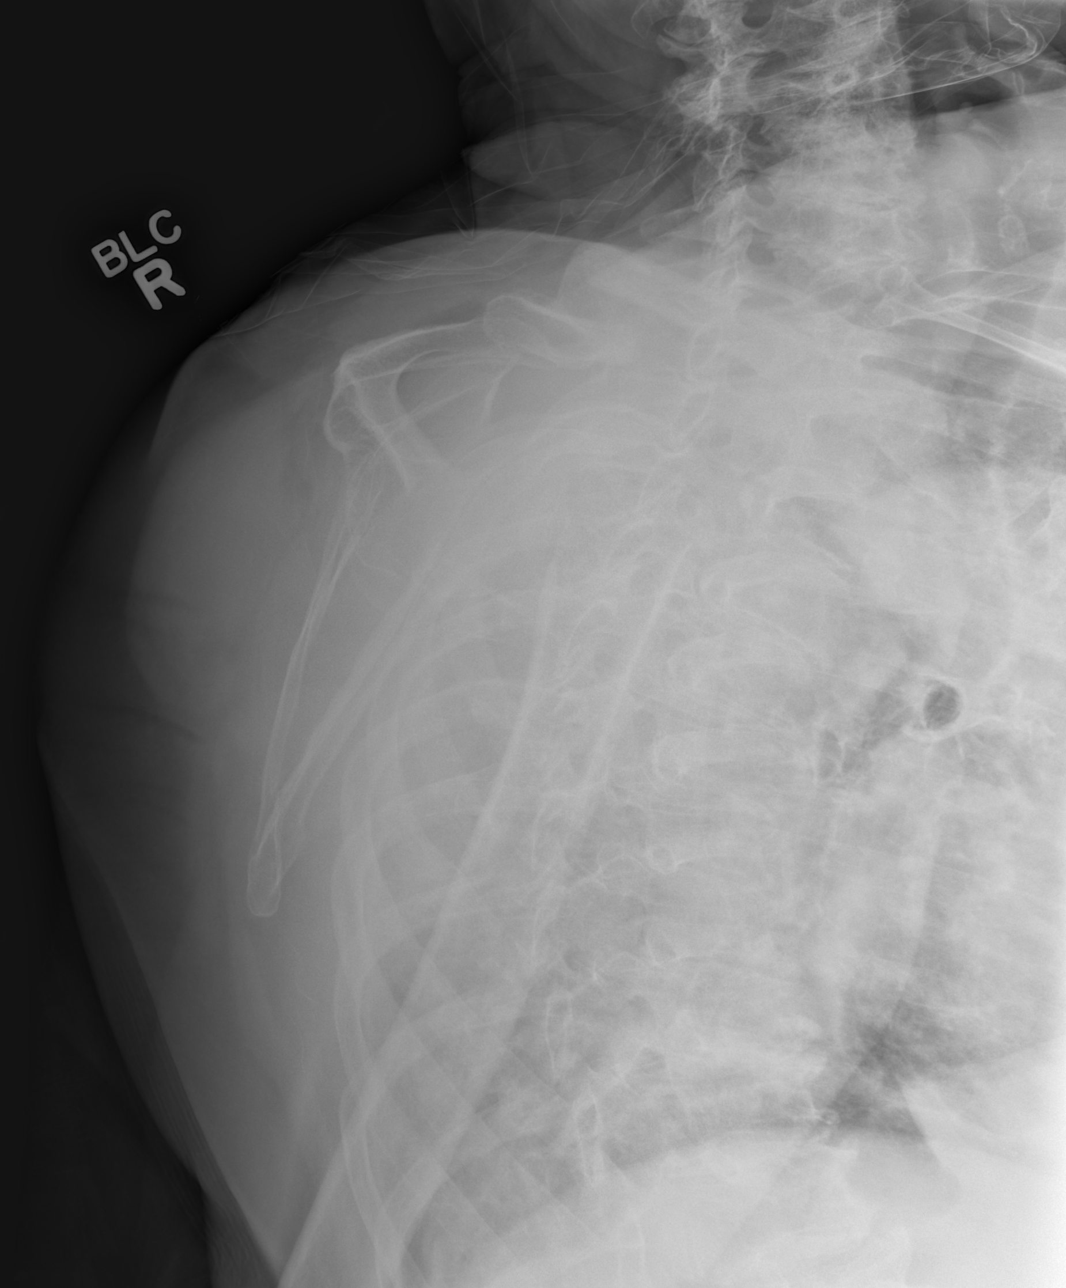

[3 of 3 positions shown; findings below may reference images not displayed]

FINDINGS: The patient has an acute surgical neck fracture of the
right humerus which involves the greater tuberosity.  There is at
least one shaft width anterior displacement.  The acromioclavicular
joint is intact with some degenerative change noted.  Right
clavicle fracture seen on the prior study has healed with one and
one half shaft width inferior displacement of the distal fragment.
IMPRESSION: 1.  Anteriorly displaced surgical neck fracture right humerus
involves the greater tuberosity.
2.  Interval healing of a right clavicle fracture.

## 2014-06-29 ENCOUNTER — Encounter: Payer: Self-pay | Admitting: Gastroenterology
